# Patient Record
Sex: Female | Born: 1985 | Race: Black or African American | Hispanic: Yes | Marital: Married | Smoking: Never smoker
Health system: Southern US, Community
[De-identification: ages and names within clinical notes are randomized; demographics above are authoritative.]

## PROBLEM LIST (undated history)

## (undated) ENCOUNTER — Inpatient Hospital Stay: Payer: Self-pay

## (undated) DIAGNOSIS — L259 Unspecified contact dermatitis, unspecified cause: Secondary | ICD-10-CM

## (undated) DIAGNOSIS — R87619 Unspecified abnormal cytological findings in specimens from cervix uteri: Secondary | ICD-10-CM

## (undated) HISTORY — DX: Unspecified contact dermatitis, unspecified cause: L25.9

## (undated) HISTORY — DX: Unspecified abnormal cytological findings in specimens from cervix uteri: R87.619

## (undated) HISTORY — PX: OTHER SURGICAL HISTORY: SHX169

---

## 2006-02-01 ENCOUNTER — Ambulatory Visit: Payer: Self-pay | Admitting: Family Medicine

## 2006-02-24 ENCOUNTER — Ambulatory Visit: Payer: Self-pay | Admitting: Family Medicine

## 2006-03-17 DIAGNOSIS — J45909 Unspecified asthma, uncomplicated: Secondary | ICD-10-CM | POA: Insufficient documentation

## 2006-03-17 DIAGNOSIS — J309 Allergic rhinitis, unspecified: Secondary | ICD-10-CM | POA: Insufficient documentation

## 2006-06-22 ENCOUNTER — Ambulatory Visit: Payer: Self-pay | Admitting: Family Medicine

## 2006-07-28 ENCOUNTER — Ambulatory Visit: Payer: Self-pay | Admitting: Family Medicine

## 2006-07-28 DIAGNOSIS — A630 Anogenital (venereal) warts: Secondary | ICD-10-CM | POA: Insufficient documentation

## 2006-07-29 ENCOUNTER — Encounter: Payer: Self-pay | Admitting: Family Medicine

## 2006-07-29 LAB — CONVERTED CEMR LAB: Chlamydia, Swab/Urine, PCR: NEGATIVE

## 2006-07-31 ENCOUNTER — Telehealth: Payer: Self-pay | Admitting: Family Medicine

## 2006-09-27 ENCOUNTER — Encounter: Payer: Self-pay | Admitting: Family Medicine

## 2006-09-27 ENCOUNTER — Other Ambulatory Visit: Admission: RE | Admit: 2006-09-27 | Discharge: 2006-09-27 | Payer: Self-pay | Admitting: Family Medicine

## 2006-09-27 ENCOUNTER — Ambulatory Visit: Payer: Self-pay | Admitting: Family Medicine

## 2006-11-17 ENCOUNTER — Encounter: Payer: Self-pay | Admitting: Family Medicine

## 2007-01-11 ENCOUNTER — Ambulatory Visit: Payer: Self-pay | Admitting: Family Medicine

## 2007-01-11 LAB — CONVERTED CEMR LAB
Ketones, urine, test strip: NEGATIVE
Nitrite: NEGATIVE
Urobilinogen, UA: 0.2

## 2007-01-12 ENCOUNTER — Encounter: Payer: Self-pay | Admitting: Family Medicine

## 2007-01-12 LAB — CONVERTED CEMR LAB
Basophils Relative: 0 % (ref 0–1)
Eosinophils Absolute: 0.2 10*3/uL (ref 0.0–0.7)
Eosinophils Relative: 1 % (ref 0–5)
HCT: 41.8 % (ref 36.0–46.0)
Hemoglobin: 13.2 g/dL (ref 12.0–15.0)
MCHC: 31.6 g/dL (ref 30.0–36.0)
MCV: 86.7 fL (ref 78.0–100.0)
Monocytes Absolute: 0.7 10*3/uL (ref 0.2–0.7)
Monocytes Relative: 4 % (ref 3–11)
RBC: 4.82 M/uL (ref 3.87–5.11)

## 2007-03-28 ENCOUNTER — Ambulatory Visit: Payer: Self-pay | Admitting: Family Medicine

## 2007-03-28 LAB — CONVERTED CEMR LAB
Bilirubin Urine: NEGATIVE
Ketones, urine, test strip: NEGATIVE
pH: 7

## 2007-06-26 ENCOUNTER — Encounter: Payer: Self-pay | Admitting: Family Medicine

## 2007-07-16 ENCOUNTER — Ambulatory Visit: Payer: Self-pay | Admitting: Family Medicine

## 2007-07-16 DIAGNOSIS — N760 Acute vaginitis: Secondary | ICD-10-CM | POA: Insufficient documentation

## 2007-07-16 LAB — CONVERTED CEMR LAB
Bilirubin Urine: NEGATIVE
Glucose, Urine, Semiquant: NEGATIVE
Nitrite: NEGATIVE
Rapid Strep: NEGATIVE

## 2007-07-17 ENCOUNTER — Encounter: Payer: Self-pay | Admitting: Family Medicine

## 2007-07-17 LAB — CONVERTED CEMR LAB

## 2007-08-17 ENCOUNTER — Ambulatory Visit: Payer: Self-pay | Admitting: Family Medicine

## 2007-08-17 LAB — CONVERTED CEMR LAB
Beta hcg, urine, semiquantitative: NEGATIVE
Protein, U semiquant: 30
Urobilinogen, UA: 1

## 2007-08-19 LAB — CONVERTED CEMR LAB
CO2: 23 meq/L (ref 19–32)
Calcium: 9.5 mg/dL (ref 8.4–10.5)
Chloride: 106 meq/L (ref 96–112)
Creatinine, Ser: 0.82 mg/dL (ref 0.40–1.20)
Eosinophils Relative: 2 % (ref 0–5)
Glucose, Bld: 77 mg/dL (ref 70–99)
HCT: 42.5 % (ref 36.0–46.0)
Hemoglobin: 13.5 g/dL (ref 12.0–15.0)
Lymphocytes Relative: 23 % (ref 12–46)
Lymphs Abs: 3.2 10*3/uL (ref 0.7–4.0)
Monocytes Absolute: 0.7 10*3/uL (ref 0.1–1.0)
Preg, Serum: NEGATIVE
RBC: 4.9 M/uL (ref 3.87–5.11)
Total Bilirubin: 0.8 mg/dL (ref 0.3–1.2)
Total Protein: 8.1 g/dL (ref 6.0–8.3)
WBC: 14 10*3/uL — ABNORMAL HIGH (ref 4.0–10.5)

## 2007-09-11 ENCOUNTER — Ambulatory Visit: Payer: Self-pay | Admitting: Family Medicine

## 2007-09-21 ENCOUNTER — Ambulatory Visit: Payer: Self-pay | Admitting: Family Medicine

## 2008-01-08 ENCOUNTER — Ambulatory Visit: Payer: Self-pay | Admitting: Family Medicine

## 2008-02-27 ENCOUNTER — Emergency Department (HOSPITAL_COMMUNITY): Admission: EM | Admit: 2008-02-27 | Discharge: 2008-02-27 | Payer: Self-pay | Admitting: Emergency Medicine

## 2008-05-13 ENCOUNTER — Ambulatory Visit: Payer: Self-pay | Admitting: Family Medicine

## 2008-05-14 ENCOUNTER — Encounter: Payer: Self-pay | Admitting: Family Medicine

## 2008-05-14 LAB — CONVERTED CEMR LAB: GC Probe Amp, Genital: NEGATIVE

## 2008-06-02 ENCOUNTER — Telehealth: Payer: Self-pay | Admitting: Family Medicine

## 2008-06-09 ENCOUNTER — Telehealth: Payer: Self-pay | Admitting: Family Medicine

## 2008-08-26 ENCOUNTER — Ambulatory Visit: Payer: Self-pay | Admitting: Family Medicine

## 2008-08-26 DIAGNOSIS — G43909 Migraine, unspecified, not intractable, without status migrainosus: Secondary | ICD-10-CM | POA: Insufficient documentation

## 2008-08-26 LAB — CONVERTED CEMR LAB
Bilirubin Urine: NEGATIVE
Ketones, urine, test strip: NEGATIVE
Nitrite: NEGATIVE
Specific Gravity, Urine: <1.005

## 2008-09-01 ENCOUNTER — Ambulatory Visit: Payer: Self-pay | Admitting: Family Medicine

## 2008-09-01 ENCOUNTER — Telehealth: Payer: Self-pay | Admitting: Family Medicine

## 2008-09-01 LAB — CONVERTED CEMR LAB: Rapid Strep: NEGATIVE

## 2008-09-25 ENCOUNTER — Encounter: Payer: Self-pay | Admitting: Family Medicine

## 2008-12-22 ENCOUNTER — Ambulatory Visit: Payer: Self-pay | Admitting: Family Medicine

## 2008-12-22 ENCOUNTER — Encounter: Payer: Self-pay | Admitting: Family Medicine

## 2008-12-22 ENCOUNTER — Other Ambulatory Visit: Admission: RE | Admit: 2008-12-22 | Discharge: 2008-12-22 | Payer: Self-pay | Admitting: Family Medicine

## 2008-12-22 DIAGNOSIS — F341 Dysthymic disorder: Secondary | ICD-10-CM

## 2008-12-25 LAB — CONVERTED CEMR LAB: Pap Smear: NORMAL

## 2008-12-26 ENCOUNTER — Ambulatory Visit: Payer: Self-pay | Admitting: Family Medicine

## 2009-02-19 ENCOUNTER — Ambulatory Visit: Payer: Self-pay | Admitting: Family Medicine

## 2009-02-19 DIAGNOSIS — B351 Tinea unguium: Secondary | ICD-10-CM

## 2009-03-05 ENCOUNTER — Telehealth: Payer: Self-pay | Admitting: Family Medicine

## 2009-05-25 ENCOUNTER — Ambulatory Visit: Payer: Self-pay | Admitting: Family Medicine

## 2009-05-27 ENCOUNTER — Telehealth: Payer: Self-pay | Admitting: Family Medicine

## 2009-05-27 ENCOUNTER — Encounter: Payer: Self-pay | Admitting: Family Medicine

## 2009-05-27 LAB — CONVERTED CEMR LAB
Basophils Absolute: 0 10*3/uL (ref 0.0–0.1)
Basophils Relative: 0 % (ref 0–1)
Eosinophils Absolute: 0.1 10*3/uL (ref 0.0–0.7)
Eosinophils Relative: 1 % (ref 0–5)
HCT: 40.9 % (ref 36.0–46.0)
Lymphocytes Relative: 30 % (ref 12–46)
MCHC: 32.5 g/dL (ref 30.0–36.0)
Monocytes Absolute: 0.8 10*3/uL (ref 0.1–1.0)
Platelets: 273 10*3/uL (ref 150–400)
RDW: 13.3 % (ref 11.5–15.5)

## 2009-05-28 ENCOUNTER — Ambulatory Visit: Payer: Self-pay | Admitting: Family Medicine

## 2009-05-30 ENCOUNTER — Encounter: Payer: Self-pay | Admitting: Family Medicine

## 2009-07-10 ENCOUNTER — Ambulatory Visit: Payer: Self-pay | Admitting: Family Medicine

## 2009-07-11 ENCOUNTER — Encounter: Payer: Self-pay | Admitting: Family Medicine

## 2009-07-13 LAB — CONVERTED CEMR LAB: Trich, Wet Prep: NONE SEEN

## 2009-07-14 ENCOUNTER — Encounter: Payer: Self-pay | Admitting: Family Medicine

## 2009-07-15 LAB — CONVERTED CEMR LAB
Chlamydia, Swab/Urine, PCR: NEGATIVE
GC Probe Amp, Urine: NEGATIVE

## 2009-08-16 ENCOUNTER — Emergency Department (HOSPITAL_COMMUNITY): Admission: EM | Admit: 2009-08-16 | Discharge: 2009-08-16 | Payer: Self-pay | Admitting: Emergency Medicine

## 2009-08-27 ENCOUNTER — Telehealth: Payer: Self-pay | Admitting: Family Medicine

## 2009-11-30 ENCOUNTER — Ambulatory Visit: Payer: Self-pay | Admitting: Family Medicine

## 2009-12-01 LAB — CONVERTED CEMR LAB
ALT: 14 units/L (ref 0–35)
AST: 20 units/L (ref 0–37)
CO2: 27 meq/L (ref 19–32)
Calcium: 9.3 mg/dL (ref 8.4–10.5)
Chlamydia, Swab/Urine, PCR: NEGATIVE
Chloride: 105 meq/L (ref 96–112)
Cholesterol: 234 mg/dL — ABNORMAL HIGH (ref 0–200)
GC Probe Amp, Urine: NEGATIVE
Potassium: 4 meq/L (ref 3.5–5.3)
Sodium: 141 meq/L (ref 135–145)
TSH: 0.784 microintl units/mL (ref 0.350–4.500)
Total CHOL/HDL Ratio: 4.4
Total Protein: 7.5 g/dL (ref 6.0–8.3)

## 2009-12-08 ENCOUNTER — Telehealth: Payer: Self-pay | Admitting: Family Medicine

## 2009-12-29 ENCOUNTER — Telehealth: Payer: Self-pay | Admitting: Family Medicine

## 2010-03-23 ENCOUNTER — Ambulatory Visit: Payer: Self-pay | Admitting: Family Medicine

## 2010-03-23 DIAGNOSIS — E669 Obesity, unspecified: Secondary | ICD-10-CM

## 2010-03-23 DIAGNOSIS — E785 Hyperlipidemia, unspecified: Secondary | ICD-10-CM | POA: Insufficient documentation

## 2010-03-30 ENCOUNTER — Ambulatory Visit: Payer: Self-pay | Admitting: Family Medicine

## 2010-04-30 ENCOUNTER — Ambulatory Visit: Admit: 2010-04-30 | Payer: Self-pay | Admitting: Family Medicine

## 2010-04-30 ENCOUNTER — Encounter: Payer: Self-pay | Admitting: Family Medicine

## 2010-04-30 ENCOUNTER — Ambulatory Visit
Admission: RE | Admit: 2010-04-30 | Discharge: 2010-04-30 | Payer: Self-pay | Source: Home / Self Care | Attending: Family Medicine | Admitting: Family Medicine

## 2010-05-04 ENCOUNTER — Encounter: Payer: Self-pay | Admitting: Family Medicine

## 2010-05-04 ENCOUNTER — Ambulatory Visit
Admission: RE | Admit: 2010-05-04 | Discharge: 2010-05-04 | Payer: Self-pay | Source: Home / Self Care | Attending: Family Medicine | Admitting: Family Medicine

## 2010-05-05 ENCOUNTER — Encounter: Payer: Self-pay | Admitting: Family Medicine

## 2010-05-05 LAB — CONVERTED CEMR LAB
Trich, Wet Prep: NONE SEEN
Yeast Wet Prep HPF POC: NONE SEEN

## 2010-05-25 NOTE — Letter (Signed)
Summary: Out of Work  [facility]  [address]   [place], [place] [address]   Phone: [phone]  Fax: [phone]    May 28, 2009   Employee:  SHALEKA BRINES    To Whom It May Concern:   For Medical reasons, please excuse the above named employee from work for the following dates:  Start:   Seen on 05-28-2009  End:   Can return to work on 06-01-2009  If you need additional information, please feel free to contact our office.         Sincerely,    Nani Gasser MD

## 2010-05-25 NOTE — Progress Notes (Signed)
Summary: BCP  Phone Note Call from Patient   Summary of Call: Would like a rx for the birth control samples you gave her- natazia? sent to [facility] in [place] Initial call taken by: Kathlene November,  Aug 27, 2009 3:14 PM  Follow-up for Phone Call        Rx sent.  Follow-up by: Nani Gasser MD,  Aug 27, 2009 4:10 PM    New/Updated Medications: NATAZIA 3/2-2/2-3/1 MG TABS (ESTRADIOL VALERATE-DIENOGEST) Take 1 tablet by mouth once a day Prescriptions: NATAZIA 3/2-2/2-3/1 MG TABS (ESTRADIOL VALERATE-DIENOGEST) Take 1 tablet by mouth once a day  #1 pack x 6   Entered and Authorized by:   Nani Gasser MD   Signed by:   Nani Gasser MD on 08/27/2009   Method used:   Electronically to        [facility] [phone]* (retail)       [address] [place], [place]  [address]       Ph: [phone]       Fax: [phone]   RxID:   [id number]

## 2010-05-25 NOTE — Letter (Signed)
Summary: Work Excuse  [facility]  [address] [place] [address], [address]   [place], [place] [address]   Phone: [phone]  Fax: [phone]    Today's Date: November 30, 2009  Name of Patient: Alexa Hunter  The above named patient had a physical today and a TB skin test place. She is coming back on Wednesday to have it read.  Sincerely yours,   Nani Gasser MD

## 2010-05-25 NOTE — Assessment & Plan Note (Signed)
Summary: CPE   Vital Signs:  Patient profile:   25 year old female Height:      64 inches Weight:      240 pounds Pulse rate:   66 / minute BP sitting:   114 / 68  (left arm) Cuff size:   large  Vitals Entered By: Kathlene November (November 30, 2009 9:26 AM) CC: physical for work- needs tb test placed. Discuss different BCP   Primary Care Provider:  Nani Gasser MD  CC:  physical for work- needs tb test placed. Discuss different BCP.  History of Present Illness: Took teh OCPs for 2 months.  Bled the whole time.  Would like to change OCPS  Noticed 6 spots on the groin are hard adn ready red  and tender. They are slowly resolving.     Current Medications (verified): 1)  Albuterol Sulfate (2.5 Mg/58ml) 0.083% Nebu (Albuterol Sulfate) .... 3ml Neb Up To Three Times A Day As Needed  Allergies (verified): No Known Drug Allergies  Comments:  Nurse/Medical Assistant: The patient's medications and allergies were reviewed with the patient and were updated in the Medication and Allergy Lists. Kathlene November (November 30, 2009 9:27 AM)  Past History:  Past Surgical History: Last updated: 08/17/2007 None  Social History: Living alone.   Working at a [employer].  Never Smoked Alcohol use-no Drug use-no Regular exercise-no  Physical Exam  General:  Well-developed,well-nourished,in no acute distress; alert,appropriate and cooperative throughout examination Head:  Normocephalic and atraumatic without obvious abnormalities. No apparent alopecia or balding. Eyes:  No corneal or conjunctival inflammation noted. EOMI. Perrla.  Ears:  External ear exam shows no significant lesions or deformities.  Otoscopic examination reveals clear canals, tympanic membranes are intact bilaterally without bulging, retraction, inflammation or discharge. Hearing is grossly normal bilaterally. Nose:  External nasal examination shows no deformity or inflammation. Nasal mucosa are pink and moist without lesions  or exudates. Mouth:  Oral mucosa and oropharynx without lesions or exudates.  Teeth in good repair. Neck:  No deformities, masses, or tenderness noted. Chest Wall:  No deformities, masses, or tenderness noted. Lungs:  Normal respiratory effort, chest expands symmetrically. Lungs are clear to auscultation, no crackles or wheezes. Heart:  Normal rate and regular rhythm. S1 and S2 normal without gallop, murmur, click, rub or other extra sounds. Abdomen:  Bowel sounds positive,abdomen soft and non-tender without masses, organomegaly or hernias noted. Msk:  No deformity or scoliosis noted of thoracic or lumbar spine.   Pulses:  R and L carotid,radial,dorsalis pedis and posterior tibial pulses are full and equal bilaterally Extremities:  No clubbing, cyanosis, edema, or deformity noted with normal full range of motion of all joints.   Neurologic:  No cranial nerve deficits noted. Station and gait are normal.  DTRs are symmetrical throughout. Sensory, motor and coordinative functions appear intact. Skin:  Erythematous nodules on her left inner thigh and 2 on her left lower abdomen.  Cervical Nodes:  No lymphadenopathy noted Psych:  Cognition and judgment appear intact. Alert and cooperative with normal attention span and concentration. No apparent delusions, illusions, hallucinations   Impression & Recommendations:  Problem # 1:  HEALTH MAINTENANCE EXAM (ICD-V70.0) She is going to check to see if her tetanus is up to date Due for screening labs.  Exam is normal. Can drop off form for work if needed. TB skin test placed She plan on moving in November back to [place].   Orders: T-Lipid Profile [id number]) T-Comprehensive Metabolic Panel [id number]) T-TSH (  [id number]) T-Chlamydia & GC Probe, Urine ([id number])  Problem # 2:  ASTHMA (ICD-493.90) She is having more frequent sxs. Has had 3 exacerbations in the last couple of months.  Will start a controller.  Start flovent. Samples  given.  If doing well in about 2 months can stop it .   Her updated medication list for this problem includes:    Albuterol Sulfate (2.5 Mg/52ml) 0.083% Nebu (Albuterol sulfate) .Marland KitchenMarland KitchenMarland KitchenMarland Kitchen 3ml neb up to three times a day as needed    Flovent Hfa 110 Mcg/act Aero (Fluticasone propionate  hfa) .[name]... 1 puff inhaled two times a day  Complete Medication List: 1)  Albuterol Sulfate (2.5 Mg/14ml) 0.083% Nebu (Albuterol sulfate) .... 3ml neb up to three times a day as needed 2)  Ortho-novum 7/7/7 (28) 0.5/0.75/1-35 Mg-mcg Tabs (Norethin-eth estrad triphasic) .... Take 1 tablet by mouth once a day 3)  Keflex 500 Mg Caps (Cephalexin) .... Take 1 tablet by mouth four times a day for 7 days 4)  Flovent Hfa 110 Mcg/act Aero (Fluticasone propionate  hfa) .[name].. 1 puff inhaled two times a day  Other Orders: TB Skin Test [id number]) Admin 1st Vaccine ([id number])  Patient Instructions: 1)  You can schedule you pap and breast exam anytime.   2)  Call if birth control causing side effects. New pill was sent. 3)  Start flovent one dose inhaled two times a day 4)  Labs are fasting. We will call you wit the results.  Prescriptions: FLOVENT HFA 110 MCG/ACT AERO (FLUTICASONE PROPIONATE  HFA) 1 puff inhaled two times a day  #1 x 3   Entered and Authorized by:   Nani Gasser MD   Signed by:   Nani Gasser MD on 11/30/2009   Method used:   Electronically to        [facility]* (retail)       [address] [place], [place]  [address]       Ph: [phone]       Fax: [phone]   RxID:   [id number] KEFLEX 500 MG CAPS (CEPHALEXIN) Take 1 tablet by mouth four times a day for 7 days  #28 x 0   Entered and Authorized by:   Nani Gasser MD   Signed by:   Nani Gasser MD on 11/30/2009   Method used:   Electronically to        [facility]* (retail)       [address] [place], [place]  [address]       Ph: [phone]       Fax: [phone]   RxID:   [id number] ORTHO-NOVUM  7/7/7 (28) 0.5/0.75/1-35 MG-MCG TABS (NORETHIN-ETH ESTRAD TRIPHASIC) Take 1 tablet by mouth once a day  #1 pack x 3   Entered and Authorized by:   Nani Gasser MD   Signed by:   Nani Gasser MD on 11/30/2009   Method used:   Electronically to        [facility]* (retail)       [address] [place], [place]  [address]       Ph: [phone]       Fax: [phone]   RxID:   [id number]    Immunizations Administered:  PPD Skin Test:    Vaccine Type: PPD    Site: left forearm    Mfr: [employer]  Dose: 0.1 ml    Route: ID    Given by: Kathlene November    Exp. Date: 02/21/2011    Lot #: [id number]   Appended Document: CPE    Clinical Lists Changes  Observations: Added new observation of TB PPDRESULT: negative (12/02/2009 13:45) Added new observation of PPD RESULT: < 5mm (12/02/2009 13:45) Added new observation of TB-PPD RDDTE: 12/02/2009 (12/02/2009 13:45)       PPD Results    Date of reading: 12/02/2009    Results: < 5mm    Interpretation: negative

## 2010-05-25 NOTE — Assessment & Plan Note (Signed)
Summary: STD??? & BIRTH CONTROL   Vital Signs:  Patient profile:   25 year old female Height:      64 inches Weight:      240 pounds Pulse rate:   71 / minute BP sitting:   120 / 69  (left arm) Cuff size:   large  Vitals Entered By: Kathlene November (July 10, 2009 1:04 PM) CC: wants on BCP and STD check   Primary Care Provider:  Nani Gasser MD  CC:  wants on BCP and STD check.  History of Present Illness: wants on BCP and STD check.  Doesn want to do DEPO again because gained weight. Not interested in the Nuvaring. would like to try a pill.  Would like to have STD testing.  Denies any change in discahrge. Does have some irritation from shaving.   Current Medications (verified): 1)  Albuterol Sulfate (2.5 Mg/47ml) 0.083% Nebu (Albuterol Sulfate) .... 3ml Neb Up To Three Times A Day As Needed  Allergies (verified): No Known Drug Allergies  Comments:  Nurse/Medical Assistant: The patient's medications and allergies were reviewed with the patient and were updated in the Medication and Allergy Lists. Kathlene November (July 10, 2009 1:04 PM)   Impression & Recommendations:  Problem # 1:  CONTRACEPTIVE COUNSELING NEC (ICD-V25.09) Would like try a pill. Samples of natazia given.   Problem # 2:  PELVIC  PAIN (ICD-789.09) Will get STD testing.   Orders: T-HIV Antibody  (Reflex) [id number]) T-Syphilis Test ([facility]) [id number]) T-Chlamydia & GC Probe, Urine ([id number]) T-Wet Prep for Christoper Allegra, Clue Cells ([id number])  Complete Medication List: 1)  Albuterol Sulfate (2.5 Mg/93ml) 0.083% Nebu (Albuterol sulfate) .... 3ml neb up to three times a day as needed

## 2010-05-25 NOTE — Assessment & Plan Note (Signed)
Summary: Missed period   Vital Signs:  Patient profile:   25 year old female Height:      64 inches Weight:      248 pounds Pulse rate:   99 / minute BP sitting:   102 / 72  (right arm) Cuff size:   large  Vitals Entered By: Avon Gully CMA, Duncan Dull) (March 30, 2010 11:19 AM) CC: Didnt have a period last month,missed 7 pills last month, last time intercourse was the 13th   Primary Care Provider:  Nani Gasser MD  CC:  Didnt have a period last month, missed 7 pills last month, and last time intercourse was the 13th.  History of Present Illness: Didnt have a period last month,missed 7 pills last month, last time intercourse was the 13th.  Says she doesn't use condoms. Been with current boyfriend for 4 years.  Says hasn't started new pill pack because was waiting for her period.   Current Medications (verified): 1)  Albuterol Sulfate (2.5 Mg/54ml) 0.083% Nebu (Albuterol Sulfate) .... 3ml Neb Up To Three Times A Day As Needed 2)  Kariva 0.15-0.02/0.01 Mg (21/5) Tabs (Desogestrel-Ethinyl Estradiol) .... Take 1 Tablet By Mouth Once A Day 3)  Flovent Hfa 110 Mcg/act Aero (Fluticasone Propionate  Hfa) .[name].. 1 Puff Inhaled Two Times A Day  Allergies (verified): No Known Drug Allergies  Comments:  Nurse/Medical Assistant: The patient's medications and allergies were reviewed with the patient and were updated in the Medication and Allergy Lists. Avon Gully CMA, Duncan Dull) (March 30, 2010 11:21 AM)  Physical Exam  General:  Well-developed,well-nourished,in no acute distress; alert,appropriate and cooperative throughout examination   Impression & Recommendations:  Problem # 1:  AMENORRHEA (ICD-626.0)  UPT is neg. Recommend go ahead and start new OCP.  Discussed other optons since having difficulty remembering to take her pill.  She wanted to stay with the pills for cost reasons.  Her updated medication list for this problem includes:    Kariva 0.15-0.02/0.01 Mg  (21/5) Tabs (Desogestrel-ethinyl estradiol) .[name]... Take 1 tablet by mouth once a day  Orders: Urine Pregnancy Test  ([address])  Complete Medication List: 1)  Albuterol Sulfate (2.5 Mg/80ml) 0.083% Nebu (Albuterol sulfate) .... 3ml neb up to three times a day as needed 2)  Kariva 0.15-0.02/0.01 Mg (21/5) Tabs (Desogestrel-ethinyl estradiol) .... Take 1 tablet by mouth once a day 3)  Flovent Hfa 110 Mcg/act Aero (Fluticasone propionate  hfa) .[name].. 1 puff inhaled two times a day   Orders Added: 1)  Urine Pregnancy Test  [81025] 2)  Est. Patient Level III [[address]]     Laboratory Results   Urine Tests  Date/Time Received: 03/30/10 Date/Time Reported: 03/30/10    Urine HCG: negative

## 2010-05-25 NOTE — Letter (Signed)
Summary: Staff Medical Report Form  Staff Medical Report Form   Imported By: Lanelle Bal 12/15/2009 11:15:33  _____________________________________________________________________  External Attachment:    Type:   Image     Comment:   External Document

## 2010-05-25 NOTE — Progress Notes (Signed)
Summary: meds  Phone Note Call from Patient   Caller: Patient Call For: Nani Gasser MD Summary of Call: Pt wants a generic BCP called in because she cant afford the last one that was rx'd Initial call taken by: Avon Gully CMA, Duncan Dull),  December 29, 2009 12:57 PM  Follow-up for Phone Call        Rx sent.  Follow-up by: Nani Gasser MD,  December 29, 2009 12:58 PM  Additional Follow-up for Phone Call Additional follow up Details #1::        Pt notified new rx sent to pharmacy Additional Follow-up by: Kathlene November,  December 29, 2009 1:24 PM    New/Updated Medications: KARIVA 0.15-0.02/0.01 MG (21/5) TABS (DESOGESTREL-ETHINYL ESTRADIOL) Take 1 tablet by mouth once a day Prescriptions: KARIVA 0.15-0.02/0.01 MG (21/5) TABS (DESOGESTREL-ETHINYL ESTRADIOL) Take 1 tablet by mouth once a day  #1 pack x 6   Entered and Authorized by:   Nani Gasser MD   Signed by:   Nani Gasser MD on 12/29/2009   Method used:   Electronically to        [facility]* (retail)       [address] [place], [place]  [address]       Ph: [phone]       Fax: [phone]   RxID:   [id number]

## 2010-05-25 NOTE — Assessment & Plan Note (Signed)
Summary: follow cholesterol and weight   Vital Signs:  Patient profile:   25 year old female Height:      64 inches Weight:      248 pounds BMI:     42.72 Pulse rate:   78 / minute BP sitting:   113 / 72  (right arm) Cuff size:   large  Vitals Entered By: Avon Gully CMA, Duncan Dull) (March 23, 2010 8:33 AM) CC: discuss chol and wt fluctuating   Primary Care Provider:  Nani Gasser MD  CC:  discuss chol and wt fluctuating.  History of Present Illness: discuss chol and wt fluctuating.  WEight keeps fluctuating up and down.  Has realy cut out foods.  This week gave up sodas adn juice.  Has been unalbe to work out but now has only one job and so plans to go back to [employer]  Went to hospital in October for an asthma attack.  WAs eating normallly when she was working 2 jobs.  Has tried hydroxycut and quick trim. Now living with her mom. is in a somewhat unhealthy living situation for her as she often eats the food to her mother cooks which she admits are not healthy food choices.  She very rarely cooks vegetables  Current Medications (verified): 1)  Albuterol Sulfate (2.5 Mg/22ml) 0.083% Nebu (Albuterol Sulfate) .... 3ml Neb Up To Three Times A Day As Needed 2)  Kariva 0.15-0.02/0.01 Mg (21/5) Tabs (Desogestrel-Ethinyl Estradiol) .... Take 1 Tablet By Mouth Once A Day 3)  Flovent Hfa 110 Mcg/act Aero (Fluticasone Propionate  Hfa) .[name].. 1 Puff Inhaled Two Times A Day  Allergies (verified): No Known Drug Allergies  Comments:  Nurse/Medical Assistant: The patient's medications and allergies were reviewed with the patient and were updated in the Medication and Allergy Lists. Avon Gully CMA, Duncan Dull) (March 23, 2010 8:34 AM)  Social History: Living alone.   Workingin food services Never Smoked Alcohol use-no Drug use-no Regular exercise-no  Physical Exam  General:  Well-developed,well-nourished,in no acute distress; alert,appropriate and cooperative throughout  examination Head:  Normocephalic and atraumatic without obvious abnormalities. No apparent alopecia or balding. Lungs:  Normal respiratory effort, chest expands symmetrically. Lungs are clear to auscultation, no crackles or wheezes. Heart:  Normal rate and regular rhythm. S1 and S2 normal without gallop, murmur, click, rub or other extra sounds.   Impression & Recommendations:  Problem # 1:  HYPERLIPIDEMIA (ICD-272.4) we discussed and reviewed her results.  She is actually gained weight since her last office visit.  We discussed making some healthy dietary changes and starting to work out again.  She does lose weight when she does this.  Then I recommend she go for fasting lab work in about one month.  It is still elevated we may need to start a statin at that time. Orders: T-Lipid Profile [id number])  Labs Reviewed: SGOT: 20 (11/30/2009)   SGPT: 14 (11/30/2009)   HDL:53 (11/30/2009)  LDL:160 (11/30/2009)  Chol:234 (11/30/2009)  Trig:104 (11/30/2009)  Problem # 2:  OBESITY (ICD-278.00) we discussed different strategies.  Now that she is back to walking only one shot I think this would make a tremendous difference.  She is planning on joining a gym and leaving two gr per eyes per mother's house which may be helpful as well.  Complete Medication List: 1)  Albuterol Sulfate (2.5 Mg/37ml) 0.083% Nebu (Albuterol sulfate) .... 3ml neb up to three times a day as needed 2)  Kariva 0.15-0.02/0.01 Mg (21/5) Tabs (Desogestrel-ethinyl estradiol) .... Take  1 tablet by mouth once a day 3)  Flovent Hfa 110 Mcg/act Aero (Fluticasone propionate  hfa) .[name].. 1 puff inhaled two times a day  Patient Instructions: 1)  Go to the lab fasting for your cholesterol.    Orders Added: 1)  T-Lipid Profile [[id number]] 2)  Est. Patient Level III [[address]]

## 2010-05-25 NOTE — Assessment & Plan Note (Signed)
Summary: FU Finger Abscess.    Vital Signs:  Patient profile:   25 year old female Height:      64 inches Weight:      237 pounds BMI:     40.83 O2 Sat:      99 % on Room air Temp:     97.9 degrees F oral Pulse rate:   65 / minute BP sitting:   105 / 70  (right arm)  O2 Flow:  Room air CC: F/U on bite, no improvement since last visit   Primary Care Provider:  Nani Gasser MD  CC:  F/U on bite and no improvement since last visit.  History of Present Illness: F/U on bite, no improvement since last visit.  Very painful. Yesterday drained green pus and blood. Still very painful.  Not taking nay pain relievers. Has been taking the doxy but didn't take anything this AM.  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   Current Medications (verified): 1)  Fluoxetine Hcl 20 Mg Tabs (Fluoxetine Hcl) .... Take 1 Tablet By Mouth Once A Day 2)  Ortho Evra 150-20 Mcg/24hr Ptwk (Norelgestromin-Eth Estradiol) .... Apply Once A Week. Rotate Sites. 3)  Albuterol Sulfate (2.5 Mg/32ml) 0.083% Nebu (Albuterol Sulfate) .... 3ml Neb Up To Three Times A Day As Needed 4)  Doxycycline Hyclate 100 Mg Tabs (Doxycycline Hyclate) .... Take 1 Tablet By Mouth Two Times A Day For 10 Days.  Allergies (verified): No Known Drug Allergies  Physical Exam  General:  Well-developed,well-nourished,in no acute distress; alert,appropriate and cooperative throughout examination Msk:  Frist finger wtih  actually decreased surrounding erythema compared to 2 days ago but now the center looks more indurated and is draining yellow pus. Would culture obtained.    Impression & Recommendations:  Problem # 1:  ABSCESS, FINGER (ICD-681.00) Likel MRSA.  Will change to bactrim since not responding as well to doxy as I would like.  Call if not better by Monday. 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  Can use Tylenol or Motrin as needed for pain relief and keep  elevated.  Her updated medication list for this problem includes:    Bactrim Ds 800-160 Mg Tabs (Sulfamethoxazole-trimethoprim) .[name]... 2 tabs by mouth two times a day for 10 days .  Complete Medication List: 1)  Fluoxetine Hcl 20 Mg Tabs (Fluoxetine hcl) .... Take 1 tablet by mouth once a day 2)  Ortho Evra 150-20 Mcg/24hr Ptwk (Norelgestromin-eth estradiol) .... Apply once a week. rotate sites. 3)  Albuterol Sulfate (2.5 Mg/73ml) 0.083% Nebu (Albuterol sulfate) .... 3ml neb up to three times a day as needed 4)  Bactrim Ds 800-160 Mg Tabs (Sulfamethoxazole-trimethoprim) .... 2 tabs by mouth two times a day for 10 days .  Other Orders: T-Culture, Wound ([id number]) Prescriptions: BACTRIM DS 800-160 MG TABS (SULFAMETHOXAZOLE-TRIMETHOPRIM) 2 tabs by mouth two times a day for 10 days .  #40 x 0   Entered and Authorized by:   Nani Gasser MD   Signed by:   Nani Gasser MD on 05/28/2009   Method used:   Electronically to        [facility] [phone]* (retail)       [address]       [place], [place]  [address]       Ph: [phone]       Fax: [phone]   RxID:   [id number]

## 2010-05-25 NOTE — Assessment & Plan Note (Signed)
Summary: BIT BY A SPIDER--SWOLLEN/   Vital Signs:  Patient profile:   25 year old female Height:      64 inches Weight:      239 pounds Temp:     98.2 degrees F oral Pulse rate:   58 / minute BP sitting:   106 / 67  (left arm) Cuff size:   large  Vitals Entered By: Kathlene November (May 25, 2009 11:37 AM) CC: ? insect bite on right index finger yesterday- painful, red, swelling   Primary Care Provider:  Nani Gasser MD  CC:  ? insect bite on right index finger yesterday- painful, red, and swelling.  History of Present Illness: ? insect bite on right index finger yesterday- painful, red, swelling. Didn't see the spider. No hiking or camping.  Did have a vesicle on it yesterday adn it drained some clear fluid. Very painful and tender and hot today. No alevel or IBU for pain relief.  IT was more red this AM.   Current Medications (verified): 1)  Fluoxetine Hcl 20 Mg Tabs (Fluoxetine Hcl) .... Take 1 Tablet By Mouth Once A Day 2)  Ortho Evra 150-20 Mcg/24hr Ptwk (Norelgestromin-Eth Estradiol) .... Apply Once A Week. Rotate Sites. 3)  Albuterol Sulfate (2.5 Mg/96ml) 0.083% Nebu (Albuterol Sulfate) .... 3ml Neb Up To Three Times A Day As Needed  Allergies (verified): No Known Drug Allergies  Comments:  Nurse/Medical Assistant: The patient's medications and allergies were reviewed with the patient and were updated in the Medication and Allergy Lists. Kathlene November (May 25, 2009 11:38 AM)  Social History: Living alone.  Engaged. Back in School. Working at a [employer].  Never Smoked Alcohol use-no Drug use-no Regular exercise-no  Physical Exam  General:  Well-developed,well-nourished,in no acute distress; alert,appropriate and cooperative throughout examination Head:  Normocephalic and atraumatic without obvious abnormalities. No apparent alopecia or balding. Msk:  Right hand, first finger. Erythematous and swollen form teh MCP to the PIP. About half way between has a 1.2  papules with a clear center. NO chane in color except forthe redness.  Very tender. No active drainage. Painful to bend it because of swelling.    Impression & Recommendations:  Problem # 1:  BITE OF NONVENOMOUS ARTHROPOD (ICD-E906.4) Assessment New Since has aprox 3-4 cm of surrounding ertythema and significant swelling will start ABX, doxy. Call if not starting gto look better in 48 hours.  Also consider MRSA thought the drianage she has had is clear and not pus so less likely.  Call if getting worse of turns black in color.   Complete Medication List: 1)  Fluoxetine Hcl 20 Mg Tabs (Fluoxetine hcl) .... Take 1 tablet by mouth once a day 2)  Ortho Evra 150-20 Mcg/24hr Ptwk (Norelgestromin-eth estradiol) .... Apply once a week. rotate sites. 3)  Albuterol Sulfate (2.5 Mg/54ml) 0.083% Nebu (Albuterol sulfate) .... 3ml neb up to three times a day as needed 4)  Doxycycline Hyclate 100 Mg Tabs (Doxycycline hyclate) .... Take 1 tablet by mouth two times a day for 10 days. Prescriptions: DOXYCYCLINE HYCLATE 100 MG TABS (DOXYCYCLINE HYCLATE) Take 1 tablet by mouth two times a day for 10 days.  #20 x 0   Entered and Authorized by:   Nani Gasser MD   Signed by:   Nani Gasser MD on 05/25/2009   Method used:   Electronically to        [facility]* (retail)       [address]  [place], [place]  [address]       Ph: [phone]       Fax: [phone]   RxID:   [id number]

## 2010-05-25 NOTE — Progress Notes (Signed)
Summary: Form Faxed  Phone Note Call from Patient   Caller: Patient Summary of Call: Pt came by and dropped off her Physical/TB form to be filled out by the Dr.... She would like to know if  you can go ahead and fax the form to her employer at Fax# [phone]... Please let patient know the status on this. Thanks, Victorino Dike Initial call taken by: Michaelle Copas,  December 08, 2009 9:15 AM  Follow-up for Phone Call        LMOVM that form was faxed. KJ LPN Follow-up by: Kathlene November,  December 08, 2009 9:21 AM

## 2010-05-25 NOTE — Progress Notes (Signed)
Summary: hand is still not better from [facility] Visit  Phone Note Call from Patient   Caller: Patient Summary of Call: Dr.Pecolia Marando     Call Back  [phone]  Patient was seen on Monday for a bite on her hand and she was told to call back if it was no better. Initial call taken by: Vanessa Swaziland,  May 27, 2009 8:09 AM  Follow-up for Phone Call        As long as not worse, continue ABX and then come in tomorrow for me to look at it again.  Follow-up by: Nani Gasser MD,  May 27, 2009 9:08 AM  Additional Follow-up for Phone Call Additional follow up Details #1::        Pt notified and sent to scheduleing. Additional Follow-up by: Kathlene November,  May 27, 2009 9:12 AM     Appended Document: hand is still not better from [facility] Visit Pt walked in after 4:30 pm today.  Dr Ovidio Kin schedule was completely full and Dr Linford Arnold not present.  She has appt in the AM.  I added a CBC with diff today.  She will stay on antibiotics and f/u in the AM. Seymour Bars, D.O.

## 2010-05-27 NOTE — Assessment & Plan Note (Signed)
Summary: Vaginitis   Vital Signs:  Patient profile:   25 year old female Height:      64 inches Weight:      246 pounds Pulse rate:   58 / minute BP sitting:   113 / 68  (right arm) Cuff size:   large  Vitals Entered By: Avon Gully CMA, Duncan Dull) (May 04, 2010 9:48 AM) CC: discharge,just noticed yesterday   Primary Care Provider:  Nani Gasser MD  CC:  discharge and just noticed yesterday.  History of Present Illness: discharge,just noticed yesterday.  Heavy discharge, but not ithcy or irritation. No odor. Some mild pelvic pressure but says usuallly feels this way when getting ready to start her periods. Says just wants to make sure not STD. She is also on amox for sinusitisi and is finally starting ot feel better. She denis any vaginal lesions or rash.    Current Medications (verified): 1)  Albuterol Sulfate (2.5 Mg/80ml) 0.083% Nebu (Albuterol Sulfate) .... 3ml Neb Up To Three Times A Day As Needed 2)  Kariva 0.15-0.02/0.01 Mg (21/5) Tabs (Desogestrel-Ethinyl Estradiol) .... Take 1 Tablet By Mouth Once A Day 3)  Flovent Hfa 110 Mcg/act Aero (Fluticasone Propionate  Hfa) .[name].. 1 Puff Inhaled Two Times A Day 4)  Amoxicillin 875 Mg Tabs (Amoxicillin) .... Take 1 Tablet By Mouth Two Times A Day For 10 Days  Allergies (verified): No Known Drug Allergies  Comments:  Nurse/Medical Assistant: The patient's medications and allergies were reviewed with the patient and were updated in the Medication and Allergy Lists. Avon Gully CMA, Duncan Dull) (May 04, 2010 9:49 AM)   Impression & Recommendations:  Problem # 1:  UNSPECIFIED VAGINITIS AND VULVOVAGINITIS (ICD-616.10)  Will send wet prep. She could have yeast as she is on ABX.  Her updated medication list for this problem includes:    Amoxicillin 875 Mg Tabs (Amoxicillin) .[name]... Take 1 tablet by mouth two times a day for 10 days  Orders: T-Chlamydia & GC Probe, Urine ([id number]) T-Wet Prep for Christoper Allegra, Clue Cells ([id number])  Complete Medication List: 1)  Albuterol Sulfate (2.5 Mg/68ml) 0.083% Nebu (Albuterol sulfate) .... 3ml neb up to three times a day as needed 2)  Kariva 0.15-0.02/0.01 Mg (21/5) Tabs (Desogestrel-ethinyl estradiol) .... Take 1 tablet by mouth once a day 3)  Flovent Hfa 110 Mcg/act Aero (Fluticasone propionate  hfa) .[name].. 1 puff inhaled two times a day 4)  Amoxicillin 875 Mg Tabs (Amoxicillin) .... Take 1 tablet by mouth two times a day for 10 days  Anticoagulation Management Assessment/Plan:            Patient Instructions: 1)  We will call you with the lab results.    Orders Added: 1)  T-Chlamydia & GC Probe, Urine [[id number]] 2)  T-Wet Prep for Christoper Allegra, Clue Cells [[id number]] 3)  Est. Patient Level II [30865]  Appended Document: Vaginitis    Clinical Lists Changes  Orders: Added new Service order of UA Dipstick w/o Micro (automated)  (81003) - Signed Added new Test order of T-Urine Culture (Spectrum Order) [id number]) - Signed Observations: Added new observation of WBC DIPSTK U: negative (05/04/2010 10:42) Added new observation of NITRITE URN: negative (05/04/2010 10:42) Added new observation of UROBILINOGEN: 0.2  (05/04/2010 10:42) Added new observation of PROTEIN, URN: negative  (05/04/2010 10:42) Added new observation of PH URINE: 6.5  (05/04/2010 10:42) Added new observation of BLOOD UR DIP: small  (05/04/2010 10:42) Added new observation of SPEC GR URIN: 1.020  (05/04/2010 10:42) Added  new observation of KETONES URN: negative  (05/04/2010 10:42) Added new observation of BILIRUBIN UR: negative  (05/04/2010 10:42) Added new observation of GLUCOSE, URN: negative  (05/04/2010 10:42) Added new observation of APPEARANCE U: Clear  (05/04/2010 10:42) Added new observation of UA COLOR: yellow  (05/04/2010 10:42)      Laboratory Results   Urine Tests  Date/Time Received: 05/04/09 Date/Time Reported: 05/04/09  Routine  Urinalysis   Color: yellow Appearance: Clear Glucose: negative   (Normal Range: Negative) Bilirubin: negative   (Normal Range: Negative) Ketone: negative   (Normal Range: Negative) Spec. Gravity: 1.020   (Normal Range: 1.003-1.035) Blood: small   (Normal Range: Negative) pH: 6.5   (Normal Range: 5.0-8.0) Protein: negative   (Normal Range: Negative) Urobilinogen: 0.2   (Normal Range: 0-1) Nitrite: negative   (Normal Range: Negative) Leukocyte Esterace: negative   (Normal Range: Negative)

## 2010-05-27 NOTE — Assessment & Plan Note (Signed)
Summary: Sinusitis   Vital Signs:  Patient profile:   25 year old female Height:      64 inches Weight:      248 pounds Pulse rate:   81 / minute BP sitting:   106 / 71  (right arm) Cuff size:   large  Vitals Entered By: Avon Gully CMA, Duncan Dull) (April 30, 2010 1:35 PM) CC: headache, sinus pressure   Primary Care Provider:  Nani Gasser MD  CC:  headache and sinus pressure.  History of Present Illness: sinus press adn pain for > 1 week.  Now with HA.  SEver bilat facial pressure.  Mostly clear nasal drainage.  Mild cough. No fever.  + sick contacts.  No meds for it.   Current Medications (verified): 1)  Albuterol Sulfate (2.5 Mg/6ml) 0.083% Nebu (Albuterol Sulfate) .... 3ml Neb Up To Three Times A Day As Needed 2)  Kariva 0.15-0.02/0.01 Mg (21/5) Tabs (Desogestrel-Ethinyl Estradiol) .... Take 1 Tablet By Mouth Once A Day 3)  Flovent Hfa 110 Mcg/act Aero (Fluticasone Propionate  Hfa) .[name].. 1 Puff Inhaled Two Times A Day  Allergies (verified): No Known Drug Allergies  Comments:  Nurse/Medical Assistant: The patient's medications and allergies were reviewed with the patient and were updated in the Medication and Allergy Lists. Avon Gully CMA, Duncan Dull) (April 30, 2010 1:37 PM)  Physical Exam  General:  Well-developed,well-nourished,in no acute distress; alert,appropriate and cooperative throughout examination Head:  Normocephalic and atraumatic without obvious abnormalities. No apparent alopecia or balding.She is very tender over the masillary sinuses.  Eyes:  No corneal or conjunctival inflammation noted. EOMI. Perrla.  Ears:  External ear exam shows no significant lesions or deformities.  Otoscopic examination reveals clear canals, tympanic membranes are intact bilaterally without bulging, retraction, inflammation or discharge. Hearing is grossly normal bilaterally. Nose:  External nasal examination shows no deformity or inflammation. Turbinates are swollen.    Mouth:  Oral mucosa and oropharynx without lesions or exudates.  Teeth in good repair. Neck:  No deformities, masses, or tenderness noted. Lungs:  Normal respiratory effort, chest expands symmetrically. Lungs are clear to auscultation, no crackles or wheezes. Heart:  Normal rate and regular rhythm. S1 and S2 normal without gallop, murmur, click, rub or other extra sounds. Skin:  no rashes.   Cervical Nodes:  No lymphadenopathy noted Psych:  Cognition and judgment appear intact. Alert and cooperative with normal attention span and concentration. No apparent delusions, illusions, hallucinations   Impression & Recommendations:  Problem # 1:  SINUSITIS - ACUTE-NOS (ICD-461.9) explained she is on the early side for treatment of sinusitis but will go ahead nad give rx for antibiotic. Can use a nasal decongestant.  Her updated medication list for this problem includes:    Amoxicillin 875 Mg Tabs (Amoxicillin) .[name]... Take 1 tablet by mouth two times a day for 10 days  Instructed on treatment. Call if symptoms persist or worsen.   Complete Medication List: 1)  Albuterol Sulfate (2.5 Mg/84ml) 0.083% Nebu (Albuterol sulfate) .... 3ml neb up to three times a day as needed 2)  Kariva 0.15-0.02/0.01 Mg (21/5) Tabs (Desogestrel-ethinyl estradiol) .... Take 1 tablet by mouth once a day 3)  Flovent Hfa 110 Mcg/act Aero (Fluticasone propionate  hfa) .[name].. 1 puff inhaled two times a day 4)  Amoxicillin 875 Mg Tabs (Amoxicillin) .... Take 1 tablet by mouth two times a day for 10 days  Patient Instructions: 1)  Call if not  better in one week.  2)  Can  take a decongestant if you want too.  Prescriptions: KARIVA 0.15-0.02/0.01 MG (21/5) TABS (DESOGESTREL-ETHINYL ESTRADIOL) Take 1 tablet by mouth once a day  #1 pack x 6   Entered and Authorized by:   Nani Gasser MD   Signed by:   Nani Gasser MD on 04/30/2010   Method used:   Electronically to        [facility]* (retail)       [address] [place], [place]  [address]       Ph: [phone]       Fax: [phone]   RxID:   [id number] AMOXICILLIN 875 MG TABS (AMOXICILLIN) Take 1 tablet by mouth two times a day for 10 days  #20 x 0   Entered and Authorized by:   Nani Gasser MD   Signed by:   Nani Gasser MD on 04/30/2010   Method used:   Electronically to        [facility]* (retail)       [address] [place], [place]  [address]       Ph: [phone]       Fax: [phone]   RxID:   [id number]    Orders Added: 1)  Est. Patient Level III [88416]

## 2010-05-27 NOTE — Letter (Signed)
Summary: Out of Work  [facility]  [address]   [place], [place] [address]   Phone: [phone]  Fax: [phone]    April 30, 2010   Employee:  Alexa Hunter    To Whom It May Concern:   For Medical reasons, please excuse the above named employee from work for the following dates:  Start:   04-30-2009  End:   1-12/2009  If you need additional information, please feel free to contact our office.         Sincerely,    Nani Gasser MD

## 2010-06-28 ENCOUNTER — Ambulatory Visit: Payer: Self-pay | Admitting: Family Medicine

## 2010-08-11 ENCOUNTER — Encounter: Payer: Self-pay | Admitting: Family Medicine

## 2010-08-12 ENCOUNTER — Telehealth: Payer: Self-pay | Admitting: *Deleted

## 2010-08-12 ENCOUNTER — Ambulatory Visit (INDEPENDENT_AMBULATORY_CARE_PROVIDER_SITE_OTHER): Payer: Commercial Managed Care - PPO | Admitting: Family Medicine

## 2010-08-12 ENCOUNTER — Encounter: Payer: Self-pay | Admitting: Family Medicine

## 2010-08-12 DIAGNOSIS — L0291 Cutaneous abscess, unspecified: Secondary | ICD-10-CM

## 2010-08-12 MED ORDER — SULFAMETHOXAZOLE-TMP DS 800-160 MG PO TABS: 1.0000 | ORAL_TABLET | Freq: Two times a day (BID) | ORAL | Status: AC

## 2010-08-12 MED ORDER — DESOGESTREL-ETHINYL ESTRADIOL 0.15-0.02/0.01 MG (21/5) PO TABS: 1.0000 | ORAL_TABLET | Freq: Every day | ORAL | Status: DC

## 2010-08-12 MED ORDER — PHENTERMINE HCL 37.5 MG PO CAPS: 37.5000 mg | ORAL_CAPSULE | ORAL | Status: DC

## 2010-08-12 NOTE — Telephone Encounter (Signed)
If it drained that is really good  Just keep wound covered.  Start the antibiotic.

## 2010-08-12 NOTE — Telephone Encounter (Signed)
Called and vm sounded like it cut but I left a message

## 2010-08-12 NOTE — Telephone Encounter (Signed)
Pt LMOM stating boil on upper legged drained and now she has a deep hole. Pt would like to know what she should do. Please advise.

## 2010-08-12 NOTE — Progress Notes (Signed)
  Subjective:    Patient ID: Alexa Hunter, female    DOB: 09-12-1985, 25 y.o.   MRN: [id number]  HPI She has 2 portals a particular concern. She has one on the right inner thigh and one on her left buttock cheek. She says they have been there for several days. The herpetic cheek started draining a couple days ago and there was some drainage out of the one on her right inner thigh as well. She said that on her buttock drains a large amount of fluid. Both are extremely tender so she made an appointment today. No fever chills or muscle aches.   Review of Systems     Objective:   Physical Exam  Constitutional: She appears well-developed.  Skin:       On her left buttock cheek she has an abscess that is approximately 4 x 5 cm. Some slight erythema around the border. No active drainage. On her right inner thigh she has an abscess that was smaller at maybe 3 x 3 cm. It has significant surrounding erythema extending out approximately 8 cm along one side. Both of these are extremely tender to touch. There is no active drainage out of the right inner thigh lesion. She also has a smaller erythematous papules on both buttock cheeks.          Assessment & Plan:  Abscess x2-will start her on Bactrim to cover for MRSA since she also has evidence of cellulitis. The lesion on her buttock was lanced and probed but no drainage was expressed. There is a wound culture was not obtained. She's to followup on Monday if she's not significantly improved. We also discussed that the erythema continues to spread her pain gets worse if she should have urgent care this weekend. Certainly she can use ibuprofen or Tylenol until then.  I&D of abscess of the left buttock. There was cleaned with iodine and alcohol swab. 1% lidocaine with epinephrine was used to anesthetize the area an incision was made with a #10 blade. The wound was probed and no active drainage. The wound was approximately 2 and half centimeters deep.

## 2010-08-12 NOTE — Telephone Encounter (Signed)
See note below

## 2010-08-12 NOTE — Patient Instructions (Signed)
Follow up at urgent care if the redness gets worse. Otherwise f/u on Monday.

## 2010-08-26 ENCOUNTER — Ambulatory Visit: Payer: Commercial Managed Care - PPO | Admitting: Family Medicine

## 2010-08-27 ENCOUNTER — Ambulatory Visit: Payer: Commercial Managed Care - PPO | Admitting: Family Medicine

## 2010-09-16 ENCOUNTER — Ambulatory Visit (INDEPENDENT_AMBULATORY_CARE_PROVIDER_SITE_OTHER): Payer: Commercial Managed Care - PPO | Admitting: Family Medicine

## 2010-09-16 ENCOUNTER — Other Ambulatory Visit (HOSPITAL_COMMUNITY)
Admission: RE | Admit: 2010-09-16 | Discharge: 2010-09-16 | Disposition: A | Discharge status: Home / Self Care | Payer: Commercial Managed Care - PPO | Source: Ambulatory Visit | Attending: Family Medicine | Admitting: Family Medicine

## 2010-09-16 ENCOUNTER — Encounter: Payer: Self-pay | Admitting: Family Medicine

## 2010-09-16 VITALS — BP 123/78 | HR 62 | Ht 64.0 in | Wt 244.0 lb

## 2010-09-16 DIAGNOSIS — E669 Obesity, unspecified: Secondary | ICD-10-CM

## 2010-09-16 DIAGNOSIS — N760 Acute vaginitis: Secondary | ICD-10-CM

## 2010-09-16 DIAGNOSIS — Z124 Encounter for screening for malignant neoplasm of cervix: Secondary | ICD-10-CM

## 2010-09-16 DIAGNOSIS — Z01419 Encounter for gynecological examination (general) (routine) without abnormal findings: Secondary | ICD-10-CM

## 2010-09-16 DIAGNOSIS — Z113 Encounter for screening for infections with a predominantly sexual mode of transmission: Secondary | ICD-10-CM | POA: Insufficient documentation

## 2010-09-16 NOTE — Progress Notes (Signed)
  Subjective:    Patient ID: Alexa Hunter, female    DOB: 04/25/1986, 25 y.o.   MRN: [id number]  HPI  Here for her pap. She is doing well. She would like STD testing. Had some heavy discharge as well but says she has been using condoms very consistently.    Weight loss - She has lost 6 lbs and has really changed her diet. She is working 6 days a week so hasn't really been able to work out like she wants to.   Review of Systems     Objective:   Physical Exam  Genitourinary: Vagina normal and uterus normal. There is no tenderness on the right labia. There is no tenderness on the left labia. Cervix exhibits no motion tenderness, no discharge and no friability. Right adnexum displays no mass. Left adnexum displays no mass. No vaginal discharge found.          Assessment & Plan:  Pap - normal exam. Will call with resutls. Add GC/chlam and vaginitis screen.   Weight - She is doing fantastic with her weight loss. She is taking the phehentermine every other day because she felt it made her jittery.

## 2010-09-17 ENCOUNTER — Encounter: Payer: Self-pay | Admitting: Family Medicine

## 2010-09-21 ENCOUNTER — Telehealth: Payer: Self-pay | Admitting: Family Medicine

## 2010-09-21 NOTE — Telephone Encounter (Signed)
Call patient: Pap smear is normal. Can you check to see if we sent a gonorrhea and Chlamydia on this as well.

## 2010-09-22 NOTE — Telephone Encounter (Signed)
Pt notified;added GC and chlamydia and pt is coming in for nurse visit for wet prep

## 2010-09-23 ENCOUNTER — Ambulatory Visit (INDEPENDENT_AMBULATORY_CARE_PROVIDER_SITE_OTHER): Payer: Commercial Managed Care - PPO | Admitting: Family Medicine

## 2010-09-23 DIAGNOSIS — N898 Other specified noninflammatory disorders of vagina: Secondary | ICD-10-CM

## 2010-09-23 NOTE — Progress Notes (Signed)
  Subjective:    Patient ID: Alexa Hunter, female    DOB: 13-Jun-1985, 25 y.o.   MRN: [id number]  HPI They were unable to run this on the pap so she can back for this today. So no charge.    Review of Systems     Objective:   Physical Exam        Assessment & Plan:

## 2010-09-24 ENCOUNTER — Telehealth: Payer: Self-pay | Admitting: Family Medicine

## 2010-09-24 LAB — WET PREP FOR TRICH, YEAST, CLUE: Yeast Wet Prep HPF POC: NONE SEEN

## 2010-09-24 MED ORDER — METRONIDAZOLE 500 MG PO TABS: 500.0000 mg | ORAL_TABLET | Freq: Two times a day (BID) | ORAL | Status: AC

## 2010-09-24 NOTE — Telephone Encounter (Signed)
Has bacterial vaginitis. Will send over ABX to treat. Not sexually transmitted.

## 2010-09-24 NOTE — Telephone Encounter (Signed)
LMOM informing Pt of the above 

## 2010-09-24 NOTE — Telephone Encounter (Signed)
Call patient: Pap is normal repeat in one year. 3 Chlamydia are negative.

## 2010-09-27 NOTE — Telephone Encounter (Signed)
Called and left message to call back in re results

## 2010-09-27 NOTE — Telephone Encounter (Signed)
Pt.notified

## 2010-12-10 ENCOUNTER — Telehealth: Payer: Self-pay | Admitting: Family Medicine

## 2010-12-10 ENCOUNTER — Ambulatory Visit (INDEPENDENT_AMBULATORY_CARE_PROVIDER_SITE_OTHER): Payer: Commercial Managed Care - PPO | Admitting: Family Medicine

## 2010-12-10 ENCOUNTER — Encounter: Payer: Self-pay | Admitting: Internal Medicine

## 2010-12-10 ENCOUNTER — Encounter: Payer: Self-pay | Admitting: Family Medicine

## 2010-12-10 VITALS — BP 115/68 | HR 77 | Wt 239.0 lb

## 2010-12-10 DIAGNOSIS — L309 Dermatitis, unspecified: Secondary | ICD-10-CM | POA: Insufficient documentation

## 2010-12-10 DIAGNOSIS — Z23 Encounter for immunization: Secondary | ICD-10-CM

## 2010-12-10 DIAGNOSIS — K921 Melena: Secondary | ICD-10-CM

## 2010-12-10 DIAGNOSIS — N926 Irregular menstruation, unspecified: Secondary | ICD-10-CM

## 2010-12-10 DIAGNOSIS — L259 Unspecified contact dermatitis, unspecified cause: Secondary | ICD-10-CM

## 2010-12-10 MED ORDER — TRIAMCINOLONE ACETONIDE 0.5 % EX OINT: TOPICAL_OINTMENT | Freq: Every day | CUTANEOUS | Status: DC

## 2010-12-10 NOTE — Telephone Encounter (Signed)
Pt is returning call to Kathlene November, LPN regarding her referral to GI.  She states in appt was made in [place], but says she is now living in [place], and will need her referral made in [place]. Plan:  Tried to review a telephone note, but didn't see one, and reviewed referral encounter and Selena Batten Johnson's open encounters, but didn't see an encounter for this pt, and do see where pt was referred, so forwarded this return call on to [employer], LPN. Please return pt call at [phone]. Alexa Newcomer, LPN [facility]

## 2010-12-10 NOTE — Telephone Encounter (Signed)
Rescheduled pt at [facility] for 01/05/2011 @ 3:30pm. Pt notified. KJ LPN

## 2010-12-10 NOTE — Progress Notes (Signed)
  Subjective:    Patient ID: Alexa Hunter, female    DOB: 25-Aug-1985, 25 y.o.   MRN: [id number]  HPI Noticed a bloody stool about a week ago.  Denies being constipated.  Blood was more red.  No abdominal pain.  No cramping. No ASA or NSAIDs.  Just taking her birth control. Never happened before. No pain with BM.  She feels the blood is mixed into the stool. Denies any anal sex.   Rash on her forearms. Hx of exzema. 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 LMP was 10/25/10. She is sexually active.    Review of Systems     Objective:   Physical Exam  Genitourinary:       No rectal lesion, tears, or hemorrhoids. No bloody stool.  Skin:       Chest dry erythematous skin on her forearms.          Assessment & Plan:  Bloody stools - will refer to GI for futher evaluation. I am concerned  Because she feels the blood is mixed into the stoll. She is not having any pain or constipatoin.  No family hx of GI problems.   Eczema/contact dermatitis.  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    Missed period - UPT is negative. Call if periods doesn't start in the next month. She is on birth control. This can sometimes caused a missed period.

## 2010-12-24 ENCOUNTER — Encounter: Payer: Self-pay | Admitting: *Deleted

## 2011-01-04 ENCOUNTER — Ambulatory Visit: Payer: Commercial Managed Care - PPO | Admitting: Family Medicine

## 2011-01-04 DIAGNOSIS — Z0289 Encounter for other administrative examinations: Secondary | ICD-10-CM

## 2011-01-05 ENCOUNTER — Ambulatory Visit: Payer: Commercial Managed Care - PPO | Admitting: Internal Medicine

## 2011-03-15 ENCOUNTER — Telehealth: Payer: Self-pay | Admitting: *Deleted

## 2011-03-15 NOTE — Telephone Encounter (Signed)
Pt has a cold- congestion, sore throat and very little cough. Is pregnant- just found out at ED on Sunday. What can she take OTC for her symptoms.

## 2011-03-16 NOTE — Telephone Encounter (Signed)
Tell patients to start with nondrug therapy (fluids, saline nasal spray, hard candy, etc)...avoid meds in the first trimester if possible...and, when needed, choose single-ingredient products. Decongestants. Start with oxymetazoline nasal spray (Afrin, etc) for a few days ONLY...to avoid rebound congestion. Suggest short-acting pseudoephedrine (Sudafed, etc) if needed during the second or third trimesters. There's not enough info to say whether phenylephrine (Sudafed PE, etc) is safe during pregnancy. Make sure patients understand that the decongestant products on shelves now probably contain phenylephrine. Explain that they'll have to ask for pseudoephedrine at the pharmacy. Antihistamines. Use chlorpheniramine first. It has been used for a long time and seems to be safe during pregnancy. Consider loratadine (Claritin, etc) as an alternative during the second or third trimester. Evidence suggests that it's probably safe during pregnancy. Antitussives/expectorants. Don't recommend guaifenesin or dextromethorphan for cough. They don't help much so the benefit may not outweigh any potential risks. Analgesics. Most women know to stay away from NSAIDs and aspirin, especially late in pregnancy. But alert women to avoid NSAIDs around conception and in the FIRST trimester too...due to new concerns about possible birth defects. Stick with acetaminophen (Tylenol, etc) for fever or pain.

## 2011-03-16 NOTE — Telephone Encounter (Signed)
Pt notified. KJ LPN 

## 2011-04-13 ENCOUNTER — Telehealth: Payer: Self-pay | Admitting: *Deleted

## 2011-04-13 NOTE — Telephone Encounter (Signed)
Pt notified. KJ LPN 

## 2011-04-13 NOTE — Telephone Encounter (Signed)
Pt is pregnant and is really constipated. Went to bathroom and thinks tore herself some because she is bleeding BRB. Please advise on what tom do and what she can do for the constipation

## 2011-04-13 NOTE — Telephone Encounter (Signed)
Start benefiber with each meal. Drink lots of water. Constipation is very common during pregnancy.  Eat lots of veggies too.

## 2011-04-26 DIAGNOSIS — O42919 Preterm premature rupture of membranes, unspecified as to length of time between rupture and onset of labor, unspecified trimester: Secondary | ICD-10-CM

## 2011-04-26 HISTORY — PX: CHOLECYSTECTOMY: SHX55

## 2011-06-24 ENCOUNTER — Encounter: Payer: Self-pay | Admitting: Physician Assistant

## 2011-06-24 ENCOUNTER — Other Ambulatory Visit: Payer: Self-pay | Admitting: Physician Assistant

## 2011-06-24 ENCOUNTER — Ambulatory Visit (INDEPENDENT_AMBULATORY_CARE_PROVIDER_SITE_OTHER): Payer: BC Managed Care – PPO | Admitting: Physician Assistant

## 2011-06-24 VITALS — BP 124/60 | HR 85 | Temp 98.4°F | Ht 66.0 in | Wt 250.0 lb

## 2011-06-24 DIAGNOSIS — Z331 Pregnant state, incidental: Secondary | ICD-10-CM

## 2011-06-24 DIAGNOSIS — N898 Other specified noninflammatory disorders of vagina: Secondary | ICD-10-CM

## 2011-06-24 MED ORDER — METRONIDAZOLE 500 MG PO TABS: 500.0000 mg | ORAL_TABLET | Freq: Three times a day (TID) | ORAL | Status: AC

## 2011-06-24 NOTE — Patient Instructions (Addendum)
Will send wet prep off and call with results. We will treat with metronidazole today and call on Monday if need to change treatment.

## 2011-06-24 NOTE — Progress Notes (Signed)
  Subjective:    Patient ID: Alexa Hunter, female    DOB: 1985-07-16, 26 y.o.   MRN: [id number]  HPI Patient was treated for BV 3 weeks ago at the [facility] ER. She was given Metronidazole and her symptoms resolved. She is 5 months pregnant. 4 days ago she started having the same discharge again which she describes as white with an odor. She denies any abdominal pain, no pain with urination, or fever. She does state that it itches.     Review of Systems     Objective:   Physical Exam  Constitutional: She is oriented to person, place, and time. She appears well-developed and well-nourished.  HENT:  Head: Normocephalic and atraumatic.  Cardiovascular: Normal rate, regular rhythm and normal heart sounds.   Pulmonary/Chest: Effort normal and breath sounds normal. She has no wheezes.  Abdominal: Soft. Bowel sounds are normal. She exhibits no distension. There is no tenderness.  Genitourinary: Vaginal discharge found.       Whiteish/Grey  discharge with distinct odor.   Neurological: She is alert and oriented to person, place, and time.  Psychiatric: She has a normal mood and affect. Her behavior is normal.          Assessment & Plan:  Vaginal Discharge- Will treat with Metronidzole because of being the weekend. Will call with results on Monday and change treatment if needed. Follow up if not improving or worsening.

## 2011-06-28 ENCOUNTER — Other Ambulatory Visit: Payer: Self-pay | Admitting: Physician Assistant

## 2011-06-28 DIAGNOSIS — B373 Candidiasis of vulva and vagina: Secondary | ICD-10-CM

## 2011-06-28 LAB — WET PREP, GENITAL: Trich, Wet Prep: NONE SEEN

## 2011-06-28 MED ORDER — CLOTRIMAZOLE 1 % VA CREA: 1.0000 | TOPICAL_CREAM | Freq: Two times a day (BID) | VAGINAL | Status: AC

## 2011-06-28 NOTE — Progress Notes (Signed)
Wet prep positive for yeast 

## 2011-10-21 ENCOUNTER — Ambulatory Visit (INDEPENDENT_AMBULATORY_CARE_PROVIDER_SITE_OTHER): Payer: BC Managed Care – PPO | Admitting: Physician Assistant

## 2011-10-21 ENCOUNTER — Encounter: Payer: Self-pay | Admitting: Physician Assistant

## 2011-10-21 VITALS — BP 102/62 | HR 74 | Temp 98.5°F | Ht 66.0 in | Wt 254.0 lb

## 2011-10-21 DIAGNOSIS — K5900 Constipation, unspecified: Secondary | ICD-10-CM

## 2011-10-21 MED ORDER — LACTULOSE 10 GM/15ML PO SOLN: 20.0000 g | Freq: Two times a day (BID) | ORAL | Status: AC

## 2011-10-21 NOTE — Progress Notes (Signed)
  Subjective:    Patient ID: Alexa Hunter, female    DOB: 05-27-1985, 26 y.o.   MRN: [id number]  HPI Patient presents to clinic to discuss constipation and blood in stool. She has had constipation since she was pregnant with her baby. Pecola Leisure is now 75 month old and she is still very constipated. She goes about every 2-3 days but when she goes it takes a lot of straining. For 1 month since the birth of her child she states that every times she has a bowel movement bright red blood comes out. She denies any hemrroids. She does not breastfeed. She does try to eat a high fiber diet but also eats a lot of cheese and milk. She does not like to drink a lot of fluid either. She states she is just never thirsty. Does not have a family hx of colon cancer and never had a colonoscopy. She has tried daily Miralax but doesn't help and doesn't taste good. She has tried colace with no relief. Nothing makes better or worse.     Review of Systems     Objective:   Physical Exam  Constitutional: She is oriented to person, place, and time. She appears well-developed and well-nourished.  HENT:  Head: Normocephalic and atraumatic.  Cardiovascular: Normal rate, regular rhythm and normal heart sounds.   Pulmonary/Chest: Effort normal and breath sounds normal.  Abdominal: Soft. Bowel sounds are normal. There is no tenderness.  Genitourinary: Guaiac negative stool.       No hemorrhoids seen outside of rectum.  Neurological: She is alert and oriented to person, place, and time.  Skin: Skin is warm and dry.  Psychiatric: She has a normal mood and affect. Her behavior is normal.          Assessment & Plan:  Constipation- Hemmocult negative- no blood detected today. Pt instructed if blood continues after being treated for constipation and stools are better to call office and we will get a colonoscopy.Will try lactulose. Should get a response in 12-48 hours. Once have bowel movment can stop medication and only use  as needed. If still continues to have problems with constipation we could consider linezess or amitiza daily.

## 2011-10-21 NOTE — Patient Instructions (Addendum)
Take lactulose twice a day until a soft bowel movement then use as needed. Consider diet changes and increase fiber. Follow up if still having regular constipation or blood in stool in next 3 months.  Constipation in Adults Constipation is having fewer than 2 bowel movements per week. Usually, the stools are hard. As we grow older, constipation is more common. If you try to fix constipation with laxatives, the problem may get worse. This is because laxatives taken over a long period of time make the colon muscles weaker. A low-fiber diet, not taking in enough fluids, and taking some medicines may make these problems worse. MEDICATIONS THAT MAY CAUSE CONSTIPATION  Water pills (diuretics).   Calcium channel blockers (used to control blood pressure and for the heart).   Certain pain medicines (narcotics).   Anticholinergics.   Anti-inflammatory agents.   Antacids that contain aluminum.  DISEASES THAT CONTRIBUTE TO CONSTIPATION  Diabetes.   Parkinson's disease.   Dementia.   Stroke.   Depression.   Illnesses that cause problems with salt and water metabolism.  HOME CARE INSTRUCTIONS   Constipation is usually best cared for without medicines. Increasing dietary fiber and eating more fruits and vegetables is the best way to manage constipation.   Slowly increase fiber intake to 25 to 38 grams per day. Whole grains, fruits, vegetables, and legumes are good sources of fiber. A dietitian can further help you incorporate high-fiber foods into your diet.   Drink enough water and fluids to keep your urine clear or pale yellow.   A fiber supplement may be added to your diet if you cannot get enough fiber from foods.   Increasing your activities also helps improve regularity.   Suppositories, as suggested by your caregiver, will also help. If you are using antacids, such as aluminum or calcium containing products, it will be helpful to switch to products containing magnesium if your  caregiver says it is okay.   If you have been given a liquid injection (enema) today, this is only a temporary measure. It should not be relied on for treatment of longstanding (chronic) constipation.   Stronger measures, such as magnesium sulfate, should be avoided if possible. This may cause uncontrollable diarrhea. Using magnesium sulfate may not allow you time to make it to the bathroom.  SEEK IMMEDIATE MEDICAL CARE IF:   There is bright red blood in the stool.   The constipation stays for more than 4 days.   There is belly (abdominal) or rectal pain.   You do not seem to be getting better.   You have any questions or concerns.  MAKE SURE YOU:   Understand these instructions.   Will watch your condition.   Will get help right away if you are not doing well or get worse.  Document Released: 01/08/2004 Document Revised: 03/31/2011 Document Reviewed: 03/15/2011 [facility] Patient Information 2012 [place], [place].

## 2011-12-08 ENCOUNTER — Encounter: Payer: Self-pay | Admitting: Sports Medicine

## 2011-12-08 ENCOUNTER — Ambulatory Visit (INDEPENDENT_AMBULATORY_CARE_PROVIDER_SITE_OTHER): Payer: BC Managed Care – PPO | Admitting: Sports Medicine

## 2011-12-08 VITALS — BP 115/73 | HR 67 | Temp 98.3°F | Resp 16 | Wt 253.0 lb

## 2011-12-08 DIAGNOSIS — J209 Acute bronchitis, unspecified: Secondary | ICD-10-CM

## 2011-12-08 MED ORDER — HYDROCODONE-ACETAMINOPHEN 7.5-325 MG PO TABS: 1.0000 | ORAL_TABLET | Freq: Three times a day (TID) | ORAL | Status: AC | PRN

## 2011-12-08 MED ORDER — MELOXICAM 15 MG PO TABS: ORAL_TABLET | ORAL | Status: DC

## 2011-12-08 NOTE — Progress Notes (Signed)
Patient ID: Alexa Hunter, female   DOB: 1986/02/09, 26 y.o.   MRN: [id number] Subjective:    CC: cough  [id number] is a pleasant 26 year old female comes in with a 3 day history of cough, chest congestion, and malaise. She denies any sick contacts, but does note that she's had some shortness of breath, sore throat, and chills. She has not yet tried any form of treatment. She denies any swelling in her legs, and denies any long trips. Cough is productive of yellowish sputum. There have been no nausea, or vomiting. There is no diarrhea.  Past medical history, Surgical history, Family history, Social history, Allergies, and medications have been entered into the medical record, reviewed, and no changes needed.   Review of Systems: No fevers, chills, night sweats, weight loss, chest pain, or shortness of breath.   Objective:    General: Well Developed, well nourished, and in no acute distress.  Neuro: Alert and oriented x3, extra-ocular muscles intact.  HEENT: Normocephalic, atraumatic, pupils equal round reactive to light, neck supple, no masses, no lymphadenopathy, thyroid nonpalpable.  Skin: Warm and dry, no rashes. Cardiac: Regular rate and rhythm, no murmurs rubs or gallops.  Respiratory: Clear to auscultation bilaterally. Not using accessory muscles, speaking in full sentences.coughing in examination room.   Impression and Recommendations:

## 2011-12-08 NOTE — Patient Instructions (Signed)

## 2011-12-08 NOTE — Assessment & Plan Note (Signed)
No suspicion of pneumonia at this point. Hydrocodone, Mobic. She will come back to see me in approximately a week, and if no better we'll likely do a chest x-ray, and antibiotics.

## 2012-01-31 ENCOUNTER — Ambulatory Visit: Payer: BC Managed Care – PPO | Admitting: Family Medicine

## 2012-01-31 DIAGNOSIS — Z0289 Encounter for other administrative examinations: Secondary | ICD-10-CM

## 2012-05-15 ENCOUNTER — Ambulatory Visit (INDEPENDENT_AMBULATORY_CARE_PROVIDER_SITE_OTHER): Payer: BC Managed Care – PPO | Admitting: Family Medicine

## 2012-05-15 ENCOUNTER — Encounter: Payer: Self-pay | Admitting: Family Medicine

## 2012-05-15 VITALS — BP 115/69 | HR 81 | Resp 18 | Wt 255.0 lb

## 2012-05-15 DIAGNOSIS — J029 Acute pharyngitis, unspecified: Secondary | ICD-10-CM

## 2012-05-15 DIAGNOSIS — R635 Abnormal weight gain: Secondary | ICD-10-CM

## 2012-05-15 DIAGNOSIS — K911 Postgastric surgery syndromes: Secondary | ICD-10-CM

## 2012-05-15 DIAGNOSIS — Z23 Encounter for immunization: Secondary | ICD-10-CM

## 2012-05-15 DIAGNOSIS — J329 Chronic sinusitis, unspecified: Secondary | ICD-10-CM

## 2012-05-15 LAB — POCT RAPID STREP A (OFFICE): Rapid Strep A Screen: NEGATIVE

## 2012-05-15 MED ORDER — AMOXICILLIN-POT CLAVULANATE 875-125 MG PO TABS: 1.0000 | ORAL_TABLET | Freq: Two times a day (BID) | ORAL | Status: DC

## 2012-05-15 NOTE — Patient Instructions (Signed)

## 2012-05-15 NOTE — Progress Notes (Signed)
  Subjective:    Patient ID: Alexa Hunter, female    DOB: [birth year], 27 y.o.   MRN: [id number]  HPI ST and nasal congestion x 1 week.  Ear pain x 2 days.  No fever.  No cough or cold meds.  Yellow nasal discharg.  Says feels like she is gtting worse the last 48 hours. Has been fatigued and started aching yesterday.    Obesity-she also wants to discuss her weight gain today. She had a baby less than a year ago. She was initially to lose some weight but has been unable to lose since then. Has been stable. She's been really trying to diet and eat right 3 she's been increasing her vegetables making sure she's getting pertaining cut out all fats and fried foods. And a partially her weight still hasn't really touched. She is now working night shift. She also has been having a dumping syndrome after having her gallbladder removed about 6 months ago. She says about 20 minutes after she she has cut the bathroom and is there for anywhere between 30-40 minutes because of diarrhea and abdominal discomfort and cramping. This is starting to cause problems at her job.  Review of Systems     Objective:   Physical Exam  Constitutional: She is oriented to person, place, and time. She appears well-developed and well-nourished.  HENT:  Head: Normocephalic and atraumatic.  Right Ear: External ear normal.  Left Ear: External ear normal.  Nose: Nose normal.  Mouth/Throat: Oropharynx is clear and moist.       TMs and canals are clear.   Eyes: Conjunctivae normal and EOM are normal. Pupils are equal, round, and reactive to light.  Neck: Neck supple. No thyromegaly present.  Cardiovascular: Normal rate, regular rhythm and normal heart sounds.   Pulmonary/Chest: Effort normal and breath sounds normal. She has no wheezes.  Lymphadenopathy:    She has no cervical adenopathy.  Neurological: She is alert and oriented to person, place, and time.  Skin: Skin is warm and dry.  Psychiatric: She has a normal mood  and affect.          Assessment & Plan:  Acute sinusitis - Will tx with augmentin. Cal if not better in one week. Can conitnue symptomatic care. Strep is negative.   Obesity-will check her thyroid to make sure that it's not making it more difficult for her to lose weight. If it's normal then consider a weight loss program or referral to bariatric clinic.  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  Explained to her that some patients have no problems in other patients to get this dumping syndrome. It is a little bit difficult to treat. She has increased her fiber recently which should theoretically be helping.

## 2012-05-16 ENCOUNTER — Telehealth: Payer: Self-pay | Admitting: Family Medicine

## 2012-05-16 MED ORDER — CHOLESTYRAMINE 4 G PO PACK: 1.0000 | PACK | Freq: Three times a day (TID) | ORAL | Status: DC

## 2012-05-16 NOTE — Telephone Encounter (Signed)
Alexa Hunter wants to start back on her birthcontrol. I advised her to have [facility] fax [place] her last pap and note. Do you want her to have a visit for the restart of birthcontrol?  Patient advised to start cholestyramine.

## 2012-05-16 NOTE — Telephone Encounter (Signed)
I am okay with restarting her birth control once we did the fax for her last Pap report.

## 2012-05-16 NOTE — Telephone Encounter (Signed)
Please call pt and let her know that she can try cholestyramine for her dumping syndrome for her bowels. I will send rx over to the pharmacy.

## 2012-05-20 NOTE — Telephone Encounter (Signed)
Whic birth control is she on?

## 2012-05-21 NOTE — Telephone Encounter (Signed)
Left message for patient to call back  

## 2012-05-22 MED ORDER — NORGESTIMATE-ETH ESTRADIOL 0.25-35 MG-MCG PO TABS: 1.0000 | ORAL_TABLET | Freq: Every day | ORAL | Status: DC

## 2012-05-22 NOTE — Addendum Note (Signed)
Addended by: Nani Gasser D on: 05/22/2012 02:12 PM   Modules accepted: Orders

## 2012-05-22 NOTE — Telephone Encounter (Signed)
Pt calls and states she was on the nuvaring prior to having her son and wanted to go on a pill. She doesn't remember the name of them and [facility] doesn't have anything on file for her prior to nuvaring

## 2012-05-22 NOTE — Telephone Encounter (Signed)
Will send over genreic Sprintec.

## 2012-05-24 ENCOUNTER — Telehealth: Payer: Self-pay | Admitting: *Deleted

## 2012-05-24 MED ORDER — FLUCONAZOLE 150 MG PO TABS: 150.0000 mg | ORAL_TABLET | Freq: Once | ORAL | Status: DC

## 2012-05-24 NOTE — Telephone Encounter (Signed)
rx sent

## 2012-05-24 NOTE — Telephone Encounter (Signed)
Pt calls and states she has a yeast infection from the antibiotics you put her on last week. Can you send this to her pharmacy

## 2012-07-20 ENCOUNTER — Encounter: Payer: Self-pay | Admitting: Family Medicine

## 2012-07-20 ENCOUNTER — Ambulatory Visit (INDEPENDENT_AMBULATORY_CARE_PROVIDER_SITE_OTHER): Payer: BC Managed Care – PPO | Admitting: Family Medicine

## 2012-07-20 VITALS — BP 111/72 | HR 68 | Ht 64.0 in | Wt 260.0 lb

## 2012-07-20 DIAGNOSIS — R197 Diarrhea, unspecified: Secondary | ICD-10-CM

## 2012-07-20 DIAGNOSIS — R142 Eructation: Secondary | ICD-10-CM

## 2012-07-20 DIAGNOSIS — K219 Gastro-esophageal reflux disease without esophagitis: Secondary | ICD-10-CM

## 2012-07-20 DIAGNOSIS — K921 Melena: Secondary | ICD-10-CM

## 2012-07-20 DIAGNOSIS — K5900 Constipation, unspecified: Secondary | ICD-10-CM

## 2012-07-20 DIAGNOSIS — R140 Abdominal distension (gaseous): Secondary | ICD-10-CM

## 2012-07-20 LAB — CBC WITH DIFFERENTIAL/PLATELET
Basophils Absolute: 0 10*3/uL (ref 0.0–0.1)
Basophils Relative: 1 % (ref 0–1)
Eosinophils Absolute: 0.1 10*3/uL (ref 0.0–0.7)
MCH: 27.2 pg (ref 26.0–34.0)
MCHC: 32.9 g/dL (ref 30.0–36.0)
Monocytes Absolute: 0.4 10*3/uL (ref 0.1–1.0)
Neutro Abs: 4.4 10*3/uL (ref 1.7–7.7)
Neutrophils Relative %: 55 % (ref 43–77)
RDW: 14.7 % (ref 11.5–15.5)

## 2012-07-20 MED ORDER — RANITIDINE HCL 150 MG PO TABS: 150.0000 mg | ORAL_TABLET | Freq: Two times a day (BID) | ORAL | Status: DC

## 2012-07-20 NOTE — Patient Instructions (Addendum)
Can try Align or another pro-biotic to balance the bacteria in your colon Can start Benefiber daily. Start Zantac for your heartburn Avoid any carbonated beverages, spicey foods , and acidic foods. Avoid eating late at night.  Avoid greasy foods as well.

## 2012-07-20 NOTE — Progress Notes (Signed)
Subjective:    Patient ID: Alexa Hunter, female    DOB: [birth year], 27 y.o.   MRN: [id number]  HPI Had GB removed 8 months ago and since then she ha shad intermittant diarrhea and constipatioin. When more constipated has more pain and blood in the stool.  Her weight is fluctuating.  She has been working our and eating more healthy.  Has been really bloated.  Has even gone to the ED for the blood in her stools. They did examine her as I thought that she might have her rectal tear. She reports that they told her that the blood was actually in the stool and that she was Hemoccult-positive. No significant nausea but she has been having a lot of heartburn recently. She's not taking anything for this. No fevers. No family history of Crohn's disease or ulcerative colitis. She does wonder if she might be intolerant to milk. The last time she able a serial with real milk she had diarrhea all day. Normally she drinks almond milk.   Review of Systems     BP 111/72  Pulse 68  Ht 5\' 4"  (1.626 m)  Wt 260 lb (117.935 kg)  BMI 44.61 kg/m2    No Known Allergies  Past Medical History  Diagnosis Date  . Contact dermatitis   . Asthma     Past Surgical History  Procedure Laterality Date  . None      History   Social History  . Marital Status: Single    Spouse Name: N/A    Number of Children: N/A  . Years of Education: N/A   Occupational History  . Food Service    Social History Main Topics  . Smoking status: Never Smoker   . Smokeless tobacco: Not on file  . Alcohol Use: Yes     Comment: occasional  . Drug Use: No  . Sexually Active: Not on file   Other Topics Concern  . Not on file   Social History Narrative  . No narrative on file    Family History  Problem Relation Age of Onset  . Migraines Mother     Outpatient Encounter Prescriptions as of 07/20/2012  Medication Sig Dispense Refill  . albuterol (PROVENTIL) (2.5 MG/3ML) 0.083% nebulizer solution Take 3 mg by  nebulization 3 (three) times daily as needed.        . fluticasone (FLOVENT HFA) 110 MCG/ACT inhaler Inhale 1 puff into the lungs 2 (two) times daily.        . norgestimate-ethinyl estradiol (ORTHO-CYCLEN,SPRINTEC,PREVIFEM) 0.25-35 MG-MCG tablet Take 1 tablet by mouth daily.  1 Package  4  . [DISCONTINUED] amoxicillin-clavulanate (AUGMENTIN) 875-125 MG per tablet Take 1 tablet by mouth 2 (two) times daily.  20 tablet  0  . [DISCONTINUED] cholestyramine (QUESTRAN) 4 G packet Take 1 packet by mouth 3 (three) times daily with meals.  90 each  12  . [DISCONTINUED] fluconazole (DIFLUCAN) 150 MG tablet Take 1 tablet (150 mg total) by mouth once.  1 tablet  1  . ranitidine (ZANTAC) 150 MG tablet Take 1 tablet (150 mg total) by mouth 2 (two) times daily.  60 tablet  2   No facility-administered encounter medications on file as of 07/20/2012.       Objective:   Physical Exam  Constitutional: She is oriented to person, place, and time. She appears well-developed and well-nourished.  HENT:  Head: Normocephalic and atraumatic.  Cardiovascular: Normal rate, regular rhythm and normal heart sounds.   Pulmonary/Chest: Effort normal  and breath sounds normal.  Abdominal: Soft. Bowel sounds are normal. She exhibits distension. She exhibits no mass. There is tenderness. There is no rebound and no guarding.  She does have some bloating and generalized tenderness. There she is most tender in the right lower quadrant today.  Neurological: She is alert and oriented to person, place, and time.  Skin: Skin is warm and dry.  Psychiatric: She has a normal mood and affect. Her behavior is normal.          Assessment & Plan:  Diarrhea/constipation/bloody stools-would like to refer her to for further evaluation endoscopy to rule out ulcerative colitis or Crohn's disease. Certainly a component of this could be dumping syndrome after having her gallbladder removed 8 months ago. We discussed increasing the fiber in  her diet and she also has intermittent constipation. Recommended follow up in a fire. Also recommend a trial of probiotic.  We will do blood tests for gluten and milk allergy though I explained that these tests are not completely sensitive for detecting these intolerances. Patient prefers to be referred to GI here and [facility].   GERD-reflux hygiene reviewed. Will start Zantac at 50 mg twice a day. Follow up with GI.

## 2012-07-23 LAB — GLIA (IGA/G) + TTG IGA
Gliadin IgA: 6.4 U/mL (ref <20)
Gliadin IgG: 4.3 U/mL (ref <20)

## 2012-07-23 LAB — ALLERGEN MILK: Milk IgE: <0.1 kU/L

## 2012-07-24 ENCOUNTER — Telehealth: Payer: Self-pay | Admitting: *Deleted

## 2012-07-24 NOTE — Telephone Encounter (Signed)
Pt called back & was notified of results 

## 2012-09-03 ENCOUNTER — Other Ambulatory Visit (HOSPITAL_COMMUNITY)
Admission: RE | Admit: 2012-09-03 | Discharge: 2012-09-03 | Disposition: A | Discharge status: Home / Self Care | Payer: BC Managed Care – PPO | Source: Ambulatory Visit | Attending: Family Medicine | Admitting: Family Medicine

## 2012-09-03 ENCOUNTER — Encounter: Payer: Self-pay | Admitting: Family Medicine

## 2012-09-03 ENCOUNTER — Ambulatory Visit (INDEPENDENT_AMBULATORY_CARE_PROVIDER_SITE_OTHER): Payer: BC Managed Care – PPO | Admitting: Family Medicine

## 2012-09-03 VITALS — BP 106/70 | HR 64 | Ht 64.0 in | Wt 261.0 lb

## 2012-09-03 DIAGNOSIS — Z1151 Encounter for screening for human papillomavirus (HPV): Secondary | ICD-10-CM

## 2012-09-03 DIAGNOSIS — A64 Unspecified sexually transmitted disease: Secondary | ICD-10-CM

## 2012-09-03 DIAGNOSIS — Z01419 Encounter for gynecological examination (general) (routine) without abnormal findings: Secondary | ICD-10-CM | POA: Insufficient documentation

## 2012-09-03 DIAGNOSIS — Z124 Encounter for screening for malignant neoplasm of cervix: Secondary | ICD-10-CM

## 2012-09-03 LAB — RPR

## 2012-09-03 NOTE — Patient Instructions (Addendum)
We can recheck here in about 2  months to make sure that you have cleared the infection. He can call here and we can order the labs. We'll need to be a first morning urine sample.

## 2012-09-03 NOTE — Progress Notes (Signed)
  Subjective:    Patient ID: Alexa Hunter, female    DOB: Jan 10, 1986, 27 y.o.   MRN: [id number]  HPI Here for pap smear only.  She was seen at [facility] on may fifth 2014 for pelvic pain. They did do STD testing her. She was positive for Chlamydia. She was treated on Friday. She had an [place] a little abnormal discharge and some pelvic discomfort. She says she was also told that she had a cyst on her left ovary. This may have been contributing to her pain as well the largest cyst was 3.3 cm on the left ovary. She did have multiple left ovarian cysts. She was negative for yeast, clue cells and moments. Her white count was 15,000 and was she was there. Urinalysis was negative. Abdominal pain is much improved.   Review of Systems     Objective:   Physical Exam  Constitutional: She appears well-developed and well-nourished.  Genitourinary: Vagina normal. No vaginal discharge found.  Cervix appears normal. Pap smear specimen was collected. She did have some pain in the left lower contract on bimanual exam. The uterus appears to feel normal. No external or vaginal lesions seen on exam today.  Skin: Skin is warm and dry.  Psychiatric: She has a normal mood and affect. Her behavior is normal.          Assessment & Plan:  Chlamydia-she did undergo treatment. Encouraged to recheck her urine in one to 2 months to make sure that she is cleared the infection. Recommend screening for HIV and syphilis as well. Make sure using condoms.   Pap smear-specimen collected today we will call her with a sample is available.

## 2012-09-04 LAB — HIV ANTIBODY (ROUTINE TESTING W REFLEX): HIV: NONREACTIVE

## 2012-09-06 ENCOUNTER — Telehealth: Payer: Self-pay | Admitting: *Deleted

## 2012-09-11 NOTE — Telephone Encounter (Signed)
error 

## 2012-09-13 ENCOUNTER — Encounter: Payer: Self-pay | Admitting: Family Medicine

## 2012-09-21 ENCOUNTER — Ambulatory Visit: Payer: BC Managed Care – PPO | Admitting: Family Medicine

## 2012-09-21 VITALS — BP 94/54 | HR 81 | Temp 98.3°F

## 2012-09-21 DIAGNOSIS — Z8619 Personal history of other infectious and parasitic diseases: Secondary | ICD-10-CM

## 2012-10-02 ENCOUNTER — Encounter: Payer: Self-pay | Admitting: Family Medicine

## 2012-10-02 ENCOUNTER — Ambulatory Visit (INDEPENDENT_AMBULATORY_CARE_PROVIDER_SITE_OTHER): Payer: Self-pay | Admitting: Family Medicine

## 2012-10-02 VITALS — BP 114/76 | HR 71 | Temp 98.2°F | Wt 260.0 lb

## 2012-10-02 DIAGNOSIS — N760 Acute vaginitis: Secondary | ICD-10-CM

## 2012-10-02 DIAGNOSIS — J029 Acute pharyngitis, unspecified: Secondary | ICD-10-CM

## 2012-10-02 DIAGNOSIS — R05 Cough: Secondary | ICD-10-CM

## 2012-10-02 LAB — CBC WITH DIFFERENTIAL/PLATELET
Basophils Relative: 1 % (ref 0–1)
Hemoglobin: 13.8 g/dL (ref 12.0–15.0)
Lymphocytes Relative: 28 % (ref 12–46)
Lymphs Abs: 2.4 10*3/uL (ref 0.7–4.0)
Monocytes Relative: 6 % (ref 3–12)
Neutro Abs: 5.4 10*3/uL (ref 1.7–7.7)
Neutrophils Relative %: 64 % (ref 43–77)
RBC: 5.08 MIL/uL (ref 3.87–5.11)
WBC: 8.5 10*3/uL (ref 4.0–10.5)

## 2012-10-02 LAB — WET PREP FOR TRICH, YEAST, CLUE: Trich, Wet Prep: NONE SEEN

## 2012-10-02 LAB — POCT RAPID STREP A (OFFICE): Rapid Strep A Screen: NEGATIVE

## 2012-10-02 MED ORDER — PREDNISONE 20 MG PO TABS: 40.0000 mg | ORAL_TABLET | Freq: Every day | ORAL | Status: DC

## 2012-10-02 NOTE — Addendum Note (Signed)
Addended by: Deno Etienne on: 10/02/2012 11:52 AM   Modules accepted: Orders

## 2012-10-02 NOTE — Progress Notes (Signed)
  Subjective:    Patient ID: Alexa Hunter, female    DOB: [birth year], 27 y.o.   MRN: [id number]  HPI ST x 1 week. Feels swollen.  Hard to swallow and wakes up in middle of night hard to breath bc of ST. No sweats or chills.  aHas had a productive cough x 1 week with yellow sputusm. Not sure if fever but has felt warm. No ear pain.  + runny nose. No nasal congestion.  No HA.  No GI sxs.  No known sick onctacts.   She was recently treated for chlamydia. Her partner finally went to get tested about 2 weeks later. She came back in for retesting was negative but has now noticed a change in vaginal discharge again over the last week and wants to be retested. She denies any itching or irritation. We initially found Chlamydia because of a change in her vaginal discharge. She says it's very thick. Her partner has been asymptomatic.   Review of Systems     Objective:   Physical Exam  Constitutional: She is oriented to person, place, and time. She appears well-developed and well-nourished.  HENT:  Head: Normocephalic and atraumatic.  Right Ear: External ear normal.  Left Ear: External ear normal.  Nose: Nose normal.  TMs and canals are clear. Oropharynx is mildly erythematous.  Eyes: Conjunctivae and EOM are normal. Pupils are equal, round, and reactive to light.  Neck: Neck supple. No thyromegaly present.  Cardiovascular: Normal rate, regular rhythm and normal heart sounds.   Pulmonary/Chest: Effort normal and breath sounds normal. She has no wheezes.  Lymphadenopathy:    She has no cervical adenopathy.  Neurological: She is alert and oriented to person, place, and time.  Skin: Skin is warm and dry.  Psychiatric: She has a normal mood and affect.          Assessment & Plan:  Acute bronchitis with pharyngitis - lung exam is completely clear today and I did not hear her actually caught in the brain. Her main complaint is her sore throat. Rapid strep was negative. I would like for her  to go to the lab for CBC and to be checked for mono. Will treat with 5 days of prednisone to see if this improves to person only but we'll hold off on antibiotics at this point in time.  Vaginitis-she would like to be retested for gonorrhea and Chlamydia since she's noticed a change in vaginal discharge. We will also perform a wet prep as well. Will call with results available.

## 2012-10-03 ENCOUNTER — Telehealth: Payer: Self-pay | Admitting: *Deleted

## 2012-10-03 LAB — EPSTEIN-BARR VIRUS VCA, IGM: EBV VCA IgM: <10 U/mL (ref <36.0)

## 2012-10-03 LAB — EPSTEIN-BARR VIRUS VCA, IGG: EBV VCA IgG: 160 U/mL — ABNORMAL HIGH (ref <18.0)

## 2012-10-03 LAB — MONONUCLEOSIS SCREEN: Mono Screen: NEGATIVE

## 2012-10-03 NOTE — Telephone Encounter (Signed)
Left message on vm with advice 

## 2012-10-03 NOTE — Telephone Encounter (Signed)
Pt took prednisone this morning after breakfast and has felt bad ever since. Sweating, nausea and doesn't know if this is due to med or what. Please advise.  Pt states still hasn't dropped her urine speciman off yet but will try and get someone to take her before they close today

## 2012-10-03 NOTE — Telephone Encounter (Signed)
Very possible that this could be due to prednisone especially if she's never taken it before.  Since it will help the sore throat, if this is still bothering her i'd recommend cutting back on prednisone to just a single 20mg  tablet every morning. For a total of five days or until throat pain is gone.

## 2012-10-05 LAB — GC/CHLAMYDIA PROBE AMP, URINE
Chlamydia, Swab/Urine, PCR: NEGATIVE
GC Probe Amp, Urine: NEGATIVE

## 2013-01-04 ENCOUNTER — Encounter: Payer: Self-pay | Admitting: Physician Assistant

## 2013-01-04 ENCOUNTER — Ambulatory Visit (INDEPENDENT_AMBULATORY_CARE_PROVIDER_SITE_OTHER): Payer: BC Managed Care – PPO | Admitting: Physician Assistant

## 2013-01-04 VITALS — BP 133/85 | HR 73 | Wt 261.0 lb

## 2013-01-04 DIAGNOSIS — Z111 Encounter for screening for respiratory tuberculosis: Secondary | ICD-10-CM

## 2013-01-04 DIAGNOSIS — Z0000 Encounter for general adult medical examination without abnormal findings: Secondary | ICD-10-CM

## 2013-01-04 DIAGNOSIS — Z131 Encounter for screening for diabetes mellitus: Secondary | ICD-10-CM

## 2013-01-04 DIAGNOSIS — Z23 Encounter for immunization: Secondary | ICD-10-CM

## 2013-01-04 DIAGNOSIS — Z13220 Encounter for screening for lipoid disorders: Secondary | ICD-10-CM

## 2013-01-04 MED ORDER — PHENTERMINE HCL 37.5 MG PO CAPS: 37.5000 mg | ORAL_CAPSULE | ORAL | Status: DC

## 2013-01-04 NOTE — Patient Instructions (Addendum)

## 2013-01-04 NOTE — Progress Notes (Signed)
  Subjective:     Alexa Hunter is a 27 y.o. female and is here for a comprehensive physical exam. The patient reports problems - She would like something to help with weight loss. She has cut out sodas and works out at least twice a week. She admits she can do more with diet and exercise. She has tried phentermine in the past and really gave her a jump start. .   History   Social History  . Marital Status: Single    Spouse Name: N/A    Number of Children: N/A  . Years of Education: N/A   Occupational History  . Food Service    Social History Main Topics  . Smoking status: Never Smoker   . Smokeless tobacco: Not on file  . Alcohol Use: Yes     Comment: occasional  . Drug Use: No  . Sexual Activity: Not on file   Other Topics Concern  . Not on file   Social History Narrative  . No narrative on file   Health Maintenance  Topic Date Due  . Influenza Vaccine  11/23/2012  . Pap Smear  09/03/2013  . Tetanus/tdap  12/09/2020    The following portions of the patient's history were reviewed and updated as appropriate: allergies, current medications, past family history, past medical history, past social history, past surgical history and problem list.  Review of Systems A comprehensive review of systems was negative.   Objective:    BP 133/85  Pulse 73  Wt 261 lb (118.389 kg)  BMI 44.78 kg/m2  LMP 12/21/2012 General appearance: alert, cooperative, appears stated age and morbidly obese Head: Normocephalic, without obvious abnormality, atraumatic Eyes: conjunctivae/corneas clear. PERRL, EOM's intact. Fundi benign. Ears: normal TM's and external ear canals both ears Nose: Nares normal. Septum midline. Mucosa normal. No drainage or sinus tenderness. Throat: lips, mucosa, and tongue normal; teeth and gums normal Neck: no adenopathy, no carotid bruit, no JVD, supple, symmetrical, trachea midline and thyroid not enlarged, symmetric, no tenderness/mass/nodules Back:  symmetric, no curvature. ROM normal. No CVA tenderness. Lungs: clear to auscultation bilaterally Heart: regular rate and rhythm, S1, S2 normal, no murmur, click, rub or gallop Abdomen: soft, non-tender; bowel sounds normal; no masses,  no organomegaly Extremities: extremities normal, atraumatic, no cyanosis or edema Pulses: 2+ and symmetric Skin: Skin color, texture, turgor normal. No rashes or lesions Lymph nodes: Cervical, supraclavicular, and axillary nodes normal.  Neurological: Grossly normal.   Assessment:    Healthy female exam.     Plan:    CPE- PPD done for work. Flu shot given without complications. Went ahead and gave phentermine to start. Discussed calorie counting and portion control. Follow up in 1 month. Interval training for exercise was discussed. Labs were ordered for screening. Added TSH to make sure some of weight issues are not thyroid related.  See After Visit Summary for Counseling Recommendations

## 2013-02-19 ENCOUNTER — Ambulatory Visit (INDEPENDENT_AMBULATORY_CARE_PROVIDER_SITE_OTHER): Payer: BC Managed Care – PPO | Admitting: Family Medicine

## 2013-02-19 ENCOUNTER — Encounter: Payer: Self-pay | Admitting: Family Medicine

## 2013-02-19 VITALS — BP 120/78 | HR 97 | Temp 98.5°F | Wt 259.0 lb

## 2013-02-19 DIAGNOSIS — J45901 Unspecified asthma with (acute) exacerbation: Secondary | ICD-10-CM

## 2013-02-19 DIAGNOSIS — J45909 Unspecified asthma, uncomplicated: Secondary | ICD-10-CM

## 2013-02-19 MED ORDER — AZITHROMYCIN 250 MG PO TABS: ORAL_TABLET | ORAL | Status: AC

## 2013-02-19 MED ORDER — PREDNISONE 20 MG PO TABS: ORAL_TABLET | ORAL | Status: AC

## 2013-02-19 NOTE — Progress Notes (Signed)
  Subjective:    Patient ID: Alexa Hunter, female    DOB: 03-04-1986, 27 y.o.   MRN: [id number]  HPI    Review of Systems     Objective:   Physical Exam        Assessment & Plan:  Retest urine to test for cure

## 2013-02-19 NOTE — Progress Notes (Signed)
CC: Alexa Hunter is a 27 y.o. female is here for Cough   Subjective: HPI:  Complains of productive cough and wheezing that has been present for the past month worsening weekly basis. Cough is moderate in severity present all hours of the day worse at night. Wheezing is mild in severity present all hours of the day. Both are slightly improved with albuterol that she is using 2-3 times a day. She was on a five-day course of prednisone 40 mg which slightly improved her symptoms only on the days that she was taking the medication. This was stopped about a week ago. No other interventions as of yet. She reports chest tightness but no chest pain otherwise. Denies nasal congestion, reflux, rashes, dysphagia, fevers, chills, nor loss of appetite   Review Of Systems Outlined In HPI  Past Medical History  Diagnosis Date  . Contact dermatitis   . Asthma      Family History  Problem Relation Age of Onset  . Migraines Mother      History  Substance Use Topics  . Smoking status: Never Smoker   . Smokeless tobacco: Not on file  . Alcohol Use: Yes     Comment: occasional     Objective: Filed Vitals:   02/19/13 1510  BP: 120/78  Pulse: 97  Temp: 98.5 F (36.9 C)    General: Alert and Oriented, No Acute Distress HEENT: Pupils equal, round, reactive to light. Conjunctivae clear.  External ears unremarkable, canals clear with intact TMs with appropriate landmarks.  Middle ear appears open without effusion. Pink inferior turbinates.  Moist mucous membranes, pharynx without inflammation nor lesions.  Neck supple without palpable lymphadenopathy nor abnormal masses. Lungs: Clear to auscultation bilaterally, no wheezing/ronchi/rales.  Breath sounds are distant. Comfortable work of breathing Cardiac: Regular rate and rhythm. Normal S1/S2.  No murmurs, rubs, nor gallops.   Mental Status: No depression, anxiety, nor agitation. Skin: Warm and dry.  Assessment & Plan: Alexa Hunter was seen today  for cough.  Diagnoses and associated orders for this visit:  ASTHMA  Asthma with acute exacerbation - predniSONE (DELTASONE) 20 MG tablet; Three tabs daily days 1-3, two tabs daily days 4-6, one tab daily days 7-9, half tab daily days 10-13. - azithromycin (ZITHROMAX) 250 MG tablet; Take two tabs at once on day 1, then one tab daily on days 2-5.    Peak flow in the yellow zone, like her to start a longer course of prednisone starting at a higher dose start taper above. Additionally start azithromycin for asthma exacerbation. Use albuterol 2 puffs every 4 hours scheduled for the next 48 hours then as needed.  Return if symptoms worsen or fail to improve.

## 2013-05-21 ENCOUNTER — Other Ambulatory Visit (HOSPITAL_COMMUNITY)
Admission: RE | Admit: 2013-05-21 | Discharge: 2013-05-21 | Disposition: A | Discharge status: Home / Self Care | Payer: BC Managed Care – PPO | Source: Ambulatory Visit | Attending: Family Medicine | Admitting: Family Medicine

## 2013-05-21 ENCOUNTER — Encounter: Payer: Self-pay | Admitting: Family Medicine

## 2013-05-21 ENCOUNTER — Ambulatory Visit (INDEPENDENT_AMBULATORY_CARE_PROVIDER_SITE_OTHER): Payer: BC Managed Care – PPO | Admitting: Family Medicine

## 2013-05-21 VITALS — BP 108/71 | HR 85 | Temp 97.7°F | Ht 64.0 in | Wt 265.0 lb

## 2013-05-21 DIAGNOSIS — Z01419 Encounter for gynecological examination (general) (routine) without abnormal findings: Secondary | ICD-10-CM | POA: Insufficient documentation

## 2013-05-21 DIAGNOSIS — Z124 Encounter for screening for malignant neoplasm of cervix: Secondary | ICD-10-CM

## 2013-05-21 DIAGNOSIS — B351 Tinea unguium: Secondary | ICD-10-CM

## 2013-05-21 DIAGNOSIS — Z113 Encounter for screening for infections with a predominantly sexual mode of transmission: Secondary | ICD-10-CM | POA: Insufficient documentation

## 2013-05-21 DIAGNOSIS — Z8742 Personal history of other diseases of the female genital tract: Secondary | ICD-10-CM | POA: Insufficient documentation

## 2013-05-21 MED ORDER — TERBINAFINE HCL 250 MG PO TABS: 250.0000 mg | ORAL_TABLET | Freq: Every day | ORAL | Status: DC

## 2013-05-21 NOTE — Progress Notes (Signed)
   Subjective:    Patient ID: Alexa Hunter, female    DOB: Oct 15, 1985, 28 y.o.   MRN: [id number]  HPI Here for Pap smear today. She does have a history of abnormal Pap smears in the past. A friend of hers was recently diagnosed with cervical cancer at age 29 and is having to have a hysterectomy and this made her little nervous and she decided to come in today. She does have a new sexual partner that she is hoping to actually get married 2 and half a second child with. She would like me 3 readings okay.  Left great toe nail deformity-we did partial nail excision in about a year and a half ago. She said that part of the nail has not grown back since then. Eventually the nail became very thick and brittle. She does get frequent headaches years. Since then she's been getting an acrylic nail placed on it to make it look more normal. It has not grown back. She says it has a green appearance.   Review of Systems     Objective:   Physical Exam  Constitutional: She appears well-developed and well-nourished.  HENT:  Head: Normocephalic and atraumatic.  Genitourinary: Uterus normal. There is no rash on the right labia. There is no rash on the left labia. Cervix exhibits friability. Cervix exhibits no motion tenderness and no discharge. Right adnexum displays no mass, no tenderness and no fullness. Left adnexum displays no mass, no tenderness and no fullness. No erythema, tenderness or bleeding around the vagina. No foreign body around the vagina. No signs of injury around the vagina. No vaginal discharge found.    erythematous spot at 11 o'clock  Skin: Skin is warm and dry.  Left great toe nail is very thick and white. It's broken about halfway across. Still attached to the skin underneath. He does have a greenish brown discoloration over the end of the nail.  Psychiatric: She has a normal mood and affect. Her behavior is normal.          Assessment & Plan:  Pap smear/cervical cancer  screening/history of abnormal Pap smear-has been performed today. We'll also do STD testing on her Pap smear.  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 We'll treat with oral Lamisil for 3-6 months. Recommend not applying acrylic nails during that time frame.

## 2013-05-22 ENCOUNTER — Other Ambulatory Visit: Payer: Self-pay | Admitting: Family Medicine

## 2013-06-05 ENCOUNTER — Encounter: Payer: Self-pay | Admitting: Family Medicine

## 2013-06-05 ENCOUNTER — Ambulatory Visit (INDEPENDENT_AMBULATORY_CARE_PROVIDER_SITE_OTHER): Payer: BC Managed Care – PPO | Admitting: Family Medicine

## 2013-06-05 VITALS — BP 114/74 | HR 89 | Temp 98.1°F | Wt 270.0 lb

## 2013-06-05 DIAGNOSIS — J329 Chronic sinusitis, unspecified: Secondary | ICD-10-CM

## 2013-06-05 DIAGNOSIS — A499 Bacterial infection, unspecified: Secondary | ICD-10-CM

## 2013-06-05 DIAGNOSIS — B9689 Other specified bacterial agents as the cause of diseases classified elsewhere: Secondary | ICD-10-CM

## 2013-06-05 MED ORDER — AMOXICILLIN-POT CLAVULANATE 500-125 MG PO TABS: ORAL_TABLET | ORAL | Status: AC

## 2013-06-05 NOTE — Progress Notes (Signed)
CC: Alexa CzarDanielle M Hunter is a 28 y.o. female is here for cough and congestion   Subjective: HPI:  Just a little under one week of nasal congestion and facial pressure localized beneath both eyes that have been present on a daily basis slowly worsening on a daily basis. Symptoms are present all hours of the day but worse when lying down at night and first thing in the morning. dayQuil and NyQuil providing no relief. Accompanied by nonproductive cough mild in severity but all other symptoms above are at least moderate in severity. Denies fevers, chills, nausea, motor or sensory disturbances, chest pain, shortness of breath, wheezing, nor confusion or rashes   Review Of Systems Outlined In HPI  Past Medical History  Diagnosis Date  . Contact dermatitis   . Asthma   . Abnormal Pap smear of cervix     Past Surgical History  Procedure Laterality Date  . None     Family History  Problem Relation Age of Onset  . Migraines Mother     History   Social History  . Marital Status: Single    Spouse Name: N/A    Number of Children: N/A  . Years of Education: N/A   Occupational History  . Food Service     [employer]    Social History Main Topics  . Smoking status: Never Smoker   . Smokeless tobacco: Not on file  . Alcohol Use: Yes     Comment: occasional  . Drug Use: No  . Sexual Activity: Not on file   Other Topics Concern  . Not on file   Social History Narrative   Has a son.       Objective: BP 114/74  Pulse 89  Temp(Src) 98.1 F (36.7 C) (Oral)  Wt 270 lb (122.471 kg)  General: Alert and Oriented, No Acute Distress HEENT: Pupils equal, round, reactive to light. Conjunctivae clear.  External ears unremarkable, canals clear with intact TMs with appropriate landmarks.  Middle ear appears open without effusion. Erythematous and he returns with moderate mucoid discharge.  Moist mucous membranes, pharynx without inflammation nor lesions however moderate cobblestoning and  postnasal drip.  Neck supple without palpable lymphadenopathy nor abnormal masses. Lungs: Clear to auscultation bilaterally, no wheezing/ronchi/rales.  Comfortable work of breathing. Good air movement. Cardiac: Regular rate and rhythm. Normal S1/S2.  No murmurs, rubs, nor gallops.   Mental Status: No depression, anxiety, nor agitation. Skin: Warm and dry.  Assessment & Plan: Alexa Hunter was seen today for cough and congestion.  Diagnoses and associated orders for this visit:  Bacterial sinusitis  Other Orders - amoxicillin-clavulanate (AUGMENTIN) 500-125 MG per tablet; Take one by mouth every 8 hours for ten total days.    Bacterial sinusitis: Start Augmentin and consider Alka-Seltzer cold and sinus for symptoms along with as needed nasal saline washes   Return if symptoms worsen or fail to improve.

## 2013-06-06 ENCOUNTER — Encounter: Payer: Self-pay | Admitting: Emergency Medicine

## 2013-06-06 ENCOUNTER — Emergency Department (INDEPENDENT_AMBULATORY_CARE_PROVIDER_SITE_OTHER)
Admission: EM | Admit: 2013-06-06 | Discharge: 2013-06-06 | Disposition: A | Discharge status: Home / Self Care | Payer: BC Managed Care – PPO | Source: Home / Self Care | Attending: Family Medicine | Admitting: Family Medicine

## 2013-06-06 DIAGNOSIS — J069 Acute upper respiratory infection, unspecified: Secondary | ICD-10-CM

## 2013-06-06 DIAGNOSIS — J45901 Unspecified asthma with (acute) exacerbation: Secondary | ICD-10-CM

## 2013-06-06 MED ORDER — PREDNISONE 50 MG PO TABS: ORAL_TABLET | ORAL | Status: DC

## 2013-06-06 MED ORDER — METHYLPREDNISOLONE SODIUM SUCC 125 MG IJ SOLR
125.0000 mg | Freq: Once | INTRAMUSCULAR | Status: AC
  Administered 2013-06-06: 125 mg via INTRAMUSCULAR

## 2013-06-06 NOTE — ED Notes (Signed)
URI, SOB.

## 2013-06-06 NOTE — ED Provider Notes (Addendum)
CSN: [id number]     Arrival date & time 06/06/13  1914 History   First MD Initiated Contact with Patient 06/06/13 [phone]     Chief Complaint  Patient presents with  . URI      HPI URI Symptoms Onset: 3-4 days  Description: rhinorrhea, nasal congestion, cough, wheezing  Modifying factors:  Baseline asthma. Was seen by PCP yesterday. Started on augmentin. Feels that she isnt better. Increased albuterol use.   Symptoms Nasal discharge: yes Fever: no Sore throat: no Cough: yes Wheezing: yes Ear pain: no GI symptoms: no Sick contacts: yes  Red Flags  Stiff neck: no Dyspnea: chest tightness  Rash: n Swallowing difficulty: no  Sinusitis Risk Factors Headache/face pain: no Double sickening: no tooth pain: no  Allergy Risk Factors Sneezing: no Itchy scratchy throat: no Seasonal symptoms: no  Flu Risk Factors Headache: no muscle aches: no severe fatigue: no   Past Medical History  Diagnosis Date  . Contact dermatitis   . Asthma   . Abnormal Pap smear of cervix    Past Surgical History  Procedure Laterality Date  . None     Family History  Problem Relation Age of Onset  . Migraines Mother    History  Substance Use Topics  . Smoking status: Never Smoker   . Smokeless tobacco: Not on file  . Alcohol Use: Yes     Comment: occasional   OB History   Grav Para Term Preterm Abortions TAB SAB Ect Mult Living   1              Review of Systems  All other systems reviewed and are negative.      Allergies  Review of patient's allergies indicates not on file.  Home Medications   Current Outpatient Rx  Name  Route  Sig  Dispense  Refill  . amoxicillin-clavulanate (AUGMENTIN) 500-125 MG per tablet      Take one by mouth every 8 hours for ten total days.   30 tablet   0   . fluticasone (FLOVENT HFA) 110 MCG/ACT inhaler   Inhalation   Inhale 1 puff into the lungs 2 (two) times daily.           [name] MONONESSA 0.25-35 MG-MCG tablet      TAKE 1 TABLET  BY MOUTH EVERY DAY   28 tablet   5   . predniSONE (DELTASONE) 50 MG tablet      1 tab daily x 5 days   5 tablet   0   . terbinafine (LAMISIL) 250 MG tablet   Oral   Take 1 tablet (250 mg total) by mouth daily.   30 tablet   5    BP 123/87  Pulse 88  Temp(Src) 97.9 F (36.6 C) (Oral)  Ht 5\' 4"  (1.626 m)  Wt 266 lb (120.657 kg)  BMI 45.64 kg/m2  SpO2 98% Physical Exam  Constitutional: She is oriented to person, place, and time.  Obese    HENT:  Head: Normocephalic and atraumatic.  Right Ear: External ear normal.  Left Ear: External ear normal.  +nasal erythema, rhinorrhea bilaterally, + post oropharyngeal erythema    Eyes: Conjunctivae are normal. Pupils are equal, round, and reactive to light.  Neck: Normal range of motion. Neck supple.  Cardiovascular: Normal rate and regular rhythm.   Pulmonary/Chest: Effort normal.  Faint wheezes    Abdominal: Soft.  Musculoskeletal: Normal range of motion.  Neurological: She is alert and oriented to person, place, and  time.  Skin: Skin is warm.    ED Course  Procedures (including critical care time) Labs Review Labs Reviewed - No data to display Imaging Review No results found.    MDM   Final diagnoses:  URI (upper respiratory infection)  Asthma exacerbation    Likley viral induced asthma exacerbation.  Solumedrol 125 mg IM x1 Short course of prednisone.  Overall reassuring resp exam apart from trace wheezing.  Discussed with pt that sxs are likely viral, but to continue augmentin for bacterial coverage.  Discussed infectious and resp red flags at length. Follow up as needed.    The patient and/or caregiver has been counseled thoroughly with regard to treatment plan and/or medications prescribed including dosage, schedule, interactions, rationale for use, and possible side effects and they verbalize understanding. Diagnoses and expected course of recovery discussed and will return if not improved as expected  or if the condition worsens. Patient and/or caregiver verbalized understanding.         Steven Newton, MD 06/06/13 [id number] [id number]  Doree AlbeeSteven Newton, MD 06/06/13 [phone]

## 2013-06-09 ENCOUNTER — Telehealth: Payer: Self-pay | Admitting: Emergency Medicine

## 2013-06-12 ENCOUNTER — Other Ambulatory Visit: Payer: Self-pay | Admitting: *Deleted

## 2013-06-12 MED ORDER — FLUCONAZOLE 150 MG PO TABS: 150.0000 mg | ORAL_TABLET | Freq: Every day | ORAL | Status: DC

## 2013-07-11 ENCOUNTER — Ambulatory Visit (INDEPENDENT_AMBULATORY_CARE_PROVIDER_SITE_OTHER): Payer: BC Managed Care – PPO

## 2013-07-11 ENCOUNTER — Other Ambulatory Visit: Payer: Self-pay | Admitting: Sports Medicine

## 2013-07-11 ENCOUNTER — Encounter: Payer: Self-pay | Admitting: Sports Medicine

## 2013-07-11 ENCOUNTER — Ambulatory Visit (INDEPENDENT_AMBULATORY_CARE_PROVIDER_SITE_OTHER): Payer: BC Managed Care – PPO | Admitting: Sports Medicine

## 2013-07-11 VITALS — BP 125/79 | HR 86 | Ht 64.0 in | Wt 273.0 lb

## 2013-07-11 DIAGNOSIS — M222X9 Patellofemoral disorders, unspecified knee: Secondary | ICD-10-CM | POA: Insufficient documentation

## 2013-07-11 DIAGNOSIS — M25569 Pain in unspecified knee: Secondary | ICD-10-CM

## 2013-07-11 MED ORDER — MELOXICAM 15 MG PO TABS: ORAL_TABLET | ORAL | Status: DC

## 2013-07-11 NOTE — Patient Instructions (Signed)
Patellofemoral Syndrome  If you have had pain in the front of your knee for a long time, chances are good that you have patellofemoral syndrome. The word patella refers to the kneecap. Femoral (or femur) refers to the thigh bone. That is the bone the kneecap sits on. The kneecap is shaped like a triangle. Its job is to protect the knee and to improve the efficiency of your thigh muscles (quadriceps). The underside of the kneecap is made of smooth tissue (cartilage). This lets the kneecap slide up and down as the knee moves. Sometimes this cartilage becomes soft. Your healthcare provider may say the cartilage breaks down. That is patellofemoral syndrome. It can affect one knee, or both. The condition is sometimes called patellofemoral pain syndrome. That is because the condition is painful. The pain usually gets worse with activity. Sitting for a long time with the knee bent also makes the pain worse. It usually gets better with rest and proper treatment.  CAUSES   No one is sure why some people develop this problem and others do not. Runners often get it. One name for the condition is "runner's knee." However, some people run for years and never have knee pain. Certain things seem to make patellofemoral syndrome more likely. They include:  · Moving out of alignment. The kneecap is supposed to move in a straight line when the thigh muscle pulls on it. Sometimes the kneecap moves in poor alignment. That can make the knee swell and hurt. Some experts believe it also wears down the cartilage.  · Injury to the kneecap.  · Strain on the knee. This may occur during sports activity. Soccer, running, skiing and cycling can put excess stress on the knee.  · Being flat-footed or knock-kneed.  SYMPTOMS   · Knee pain.  · Pain under the kneecap. This is usually a dull, aching pain.  · Pain in the knee when doing certain things: squatting, kneeling, going up or down stairs.  · Pain in the knee when you stand up after sitting down  for awhile.  · Tightness in the knee.  · Loss of muscle strength in the thigh.  · Swelling of the knee.  DIAGNOSIS   Healthcare providers often send people with knee pain to an orthopedic caregiver. This person has special training to treat problems with bones and joints. To decide what is causing your knee pain, your caregiver will probably:  · Do a physical exam. This will probably include:  · Asking about symptoms you have noticed.  · Asking about your activities and any injuries.  · Feeling your knee. Moving it. This will help test the knee's strength. It will also check alignment (whether the knee and leg are aligned normally).  · Order some tests, such as:  · Imaging tests. They create pictures of the inside of the knee. Tests may include:  · X-rays.  · Computed tomography (CT) scan. This uses X-rays and a computer to show more detail.  · Magnetic resonance imaging (MRI). This test uses magnets, radio waves and a computer to make pictures.  TREATMENT   · Medication is almost always used first. It can relieve pain. It also can reduce swelling. Non-steroidal anti-inflammatory medicines (called NSAIDs) are usually suggested. Sometimes a stronger form is needed. A stronger form would require a prescription.  · Other treatment may be needed after the swelling goes down. Possibilities include:  · Exercise. Certain exercises can make the muscles around the knee stronger which decreases the   pressure on the knee cap. This includes the thigh muscle. Certain exercises also may be suggested to increase your flexibility.  · A knee brace. This gives the knee extra support and helps align the movement of the knee cap.  · Orthotics. These are special shoe inserts. They can help keep your leg and knee aligned.  · Surgery is sometimes needed. This is rare. Options include:  · Arthroscopy. The surgeon uses a special tool to remove any damaged pieces of the kneecap. Only a few small incisions (cuts) are needed.  · Realignment.  This is open surgery. The goals are to reduce pressure and fix the way the kneecap moves.  HOME CARE INSTRUCTIONS   · Take any medication prescribed by your healthcare provider. Follow the directions carefully.  · If your knee is swollen:  · Put ice or cold packs on it. Do this for 20 to 30 minutes, 3 to 4 times a day.  · Keep the knee raised. Make sure it is supported. Put a pillow under it.  · Rest your knee. For example, take the elevator instead of the stairs for awhile. Or, take a break from sports activity that strain your knee. Try walking or swimming instead.  · Whenever you are active:  · Use an elastic bandage on your knee. This gives it support.  · After any activity, put ice or cold packs on your knees. Do this for about 10 to 20 minutes.  · Make sure you wear shoes that give good support. Make sure they are not worn down. The heels should not slant in or out.  SEEK MEDICAL CARE IF:   · Knee pain gets worse. Or it does not go away, even after taking pain medicine.  · Swelling does not go down.  · Your thigh muscle becomes weak.  · You have an oral temperature above 102° F (38.9° C).  SEEK IMMEDIATE MEDICAL CARE IF:   You have an oral temperature above 102° F (38.9° C), not controlled by medicine.  Document Released: 03/30/2009 Document Revised: 07/04/2011 Document Reviewed: 03/30/2009  [employer]® Patient Information ©2014 [employer].

## 2013-07-11 NOTE — Assessment & Plan Note (Signed)
Right-sided, moderate, persistent. Formal physical therapy, x-rays, Mobic, return to see me in one month.

## 2013-07-11 NOTE — Progress Notes (Signed)
   Subjective:    I'm seeing this patient as a consultation for:  Dr. Linford ArnoldMetheney  CC: R Knee pain  HPI: Patient is a pleasant 28 yo female who presents with 4 day history of right knee pain. Patient has a moderate to severe sharp shooting pain in her anterior right knee worse with raising from a chair and walking up stairs. Does endorse some morning stiffness. 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 Denies numbness or tingling in the leg.   Past medical history, Surgical history, Family history not pertinant except as noted below, Social history, Allergies, and medications have been entered into the medical record, reviewed, and no changes needed.   Review of Systems: No headache, visual changes, nausea, vomiting, diarrhea, constipation, dizziness, abdominal pain, skin rash, fevers, chills, night sweats, weight loss, swollen lymph nodes, body aches, joint swelling, muscle aches, chest pain, shortness of breath, mood changes, visual or auditory hallucinations.   Objective:   General: Well Developed, well nourished, and in no acute distress.  Neuro/Psych: Alert and oriented x3, extra-ocular muscles intact, able to move all 4 extremities, sensation grossly intact. Skin: Warm and dry, no rashes noted.  Respiratory: Not using accessory muscles, speaking in full sentences, trachea midline.  Cardiovascular: Pulses palpable, no extremity edema. Abdomen: Does not appear distended. Right knee: Slightly tender to palpation over anterior knee. Pain with passive and active knee extension. Negative anterior drawer test.    Impression and Recommendations:   This case required medical decision making of moderate complexity.

## 2013-07-24 ENCOUNTER — Ambulatory Visit: Payer: BC Managed Care – PPO

## 2013-08-12 ENCOUNTER — Ambulatory Visit: Payer: BC Managed Care – PPO | Admitting: Sports Medicine

## 2013-08-12 DIAGNOSIS — Z0289 Encounter for other administrative examinations: Secondary | ICD-10-CM

## 2013-10-01 ENCOUNTER — Other Ambulatory Visit: Payer: Self-pay | Admitting: *Deleted

## 2013-10-01 MED ORDER — NORGESTIMATE-ETH ESTRADIOL 0.25-35 MG-MCG PO TABS: ORAL_TABLET | ORAL | Status: DC

## 2013-11-08 ENCOUNTER — Ambulatory Visit (INDEPENDENT_AMBULATORY_CARE_PROVIDER_SITE_OTHER): Payer: BC Managed Care – PPO | Admitting: Family Medicine

## 2013-11-08 ENCOUNTER — Encounter: Payer: Self-pay | Admitting: Family Medicine

## 2013-11-08 ENCOUNTER — Ambulatory Visit (INDEPENDENT_AMBULATORY_CARE_PROVIDER_SITE_OTHER): Payer: BC Managed Care – PPO

## 2013-11-08 VITALS — BP 128/69 | HR 73 | Wt 277.0 lb

## 2013-11-08 DIAGNOSIS — N912 Amenorrhea, unspecified: Secondary | ICD-10-CM

## 2013-11-08 DIAGNOSIS — M25579 Pain in unspecified ankle and joints of unspecified foot: Secondary | ICD-10-CM

## 2013-11-08 DIAGNOSIS — S93409A Sprain of unspecified ligament of unspecified ankle, initial encounter: Secondary | ICD-10-CM

## 2013-11-08 DIAGNOSIS — M25571 Pain in right ankle and joints of right foot: Secondary | ICD-10-CM

## 2013-11-08 DIAGNOSIS — S93401A Sprain of unspecified ligament of right ankle, initial encounter: Secondary | ICD-10-CM

## 2013-11-08 LAB — POCT URINE PREGNANCY: Preg Test, Ur: NEGATIVE

## 2013-11-08 NOTE — Patient Instructions (Signed)
Rest, elevate, and ice the ankle this weekend. If you feel it is still difficult to bear weight on Monday then please let me know and we can get you a work note. Recommend wearing a support or brace for 1-2 weeks. After this we can recommend start doing the strengthening exercises. Your x-ray was negative for fracture today.

## 2013-11-08 NOTE — Progress Notes (Signed)
   Subjective:    Patient ID: Alexa Hunter, female    DOB: 1985/07/19, 28 y.o.   MRN: [id number]  HPI Here to discuss right ankle pain and swelling that has been going on for several months. . No initial acute injury or trauma. She does tend to put more for weight on that foot especially if she stands for long periods.  Then about a week ago she tripped and stumbled while dancing and now getting an intermittant sharp pain in the middle of the ankle. No OTC meds for pain..  Noticed more swollen after work at the end of the day. Both ankles are actually swollen by the end of the day. But the pain is predominately in the right ankle.   Review of Systems     Objective:   Physical Exam  Constitutional: She is oriented to person, place, and time. She appears well-developed and well-nourished.  HENT:  Head: Normocephalic and atraumatic.  Musculoskeletal:  Right ankle is tender over the posterior sheath of the lateral malleolus. She's also tender just below the malleolus. No increased laxity. Strength in all 4 directions is 5 out of 5. Dorsal pedal pulses 2+ bilaterally. She does have some trace ankle edema bilaterally.  Neurological: She is alert and oriented to person, place, and time.  Skin: Skin is warm and dry.  Psychiatric: She has a normal mood and affect.          Assessment & Plan:  Right ankle pain-most likely sprain I would like to get x-ray films since she is having difficulty walking on it by the end of the day and she did roll her ankle while dancing about a week ago. X-ray was negative for fracture. Recommend elevation ice and an anti-inflammatory over-the-counter. Will wear a brace for the next one to 2 weeks. Recommend trying to keep up the ankle the next couple days. She still having pain on Monday then we may need to write her out of work for couple more days. Given handout on exercises to do for strengthening. Call if not better in 3-4 weeks.

## 2013-11-22 ENCOUNTER — Ambulatory Visit (INDEPENDENT_AMBULATORY_CARE_PROVIDER_SITE_OTHER): Payer: BC Managed Care – PPO | Admitting: Family Medicine

## 2013-11-22 ENCOUNTER — Encounter: Payer: Self-pay | Admitting: Family Medicine

## 2013-11-22 VITALS — BP 124/72 | HR 69 | Wt 273.0 lb

## 2013-11-22 DIAGNOSIS — L609 Nail disorder, unspecified: Secondary | ICD-10-CM

## 2013-11-22 DIAGNOSIS — M79675 Pain in left toe(s): Secondary | ICD-10-CM

## 2013-11-22 DIAGNOSIS — L608 Other nail disorders: Secondary | ICD-10-CM

## 2013-11-22 DIAGNOSIS — M79609 Pain in unspecified limb: Secondary | ICD-10-CM

## 2013-11-22 MED ORDER — AMOXICILLIN-POT CLAVULANATE 500-125 MG PO TABS: ORAL_TABLET | ORAL | Status: AC

## 2013-11-22 NOTE — Progress Notes (Signed)
CC: Alexa Hunter is a 28 y.o. female is here for infected toenail   Subjective: HPI:  Left great toe pain that has been present for the past week worsening on a daily basis now moderate in severity. Worse when walking or when she's wearing a shoe. Symptoms seem to be worsening along with worsening lifting of the toenail. No interventions as of yet. Denies discharge, nor skin changes at this toe. Denies recent or remote trauma other than ingrown toenail removal a few years ago. Denies fevers, chills nor joint pain elsewhere   Review Of Systems Outlined In HPI  Past Medical History  Diagnosis Date  . Contact dermatitis   . Asthma   . Abnormal Pap smear of cervix     Past Surgical History  Procedure Laterality Date  . None     Family History  Problem Relation Age of Onset  . Migraines Mother     History   Social History  . Marital Status: Single    Spouse Name: N/A    Number of Children: N/A  . Years of Education: N/A   Occupational History  . Food Service     [employer]    Social History Main Topics  . Smoking status: Never Smoker   . Smokeless tobacco: Not on file  . Alcohol Use: Yes     Comment: occasional  . Drug Use: No  . Sexual Activity: Not on file   Other Topics Concern  . Not on file   Social History Narrative   Has a son.       Objective: BP 124/72  Pulse 69  Wt 273 lb (123.832 kg)  General: Alert and Oriented, No Acute Distress Lungs: Clear comfortable work of breathing Cardiac: Regular rate and rhythm.  Extremities: No peripheral edema.  Strong peripheral pulses.  Mental Status: No depression, anxiety, nor agitation. Skin: Warm and dry. Mild redness at the base of the left great toenail which is quite tender to touch. No pain with pressure to the plantar aspect of the great toe nor is there pain with compression of the great toe laterally. Her toenail is growing at 45 up from the [place], additionally underneath appears to be curling in  on the nailbed  Assessment & Plan: Duwayne HeckDanielle was seen today for infected toenail.  Diagnoses and associated orders for this visit:  Pain of toe of left foot - Ambulatory referral to Podiatry  Toenail deformity - Ambulatory referral to Podiatry - amoxicillin-clavulanate (AUGMENTIN) 500-125 MG per tablet; Take one by mouth every 8 hours for ten total days.    She was given a basin and instructions to soak the foot in warm Epsom salts soaks for 15 minutes twice a day until she can see podiatry for consideration of either removal of the nail or shaving down the elevated portion of her nail. Start Augmentin given evidence of mild cellulitis at the proximal nailbed  Return if symptoms worsen or fail to improve.

## 2014-01-21 ENCOUNTER — Encounter: Payer: Self-pay | Admitting: Sports Medicine

## 2014-01-21 ENCOUNTER — Ambulatory Visit (INDEPENDENT_AMBULATORY_CARE_PROVIDER_SITE_OTHER): Payer: BC Managed Care – PPO | Admitting: Sports Medicine

## 2014-01-21 VITALS — BP 113/69 | HR 88 | Ht 64.0 in | Wt 274.0 lb

## 2014-01-21 DIAGNOSIS — R300 Dysuria: Secondary | ICD-10-CM | POA: Insufficient documentation

## 2014-01-21 LAB — POCT URINALYSIS DIPSTICK
Bilirubin, UA: NEGATIVE
Glucose, UA: NEGATIVE
Leukocytes, UA: NEGATIVE
Nitrite, UA: NEGATIVE
Protein, UA: NEGATIVE
Spec Grav, UA: 1.02
Urobilinogen, UA: 0.2
pH, UA: 7.5

## 2014-01-21 MED ORDER — CEPHALEXIN 500 MG PO CAPS: 500.0000 mg | ORAL_CAPSULE | Freq: Two times a day (BID) | ORAL | Status: DC

## 2014-01-21 NOTE — Progress Notes (Signed)
  Subjective:    CC: Dysuria  HPI: This is a pleasant 28 year old female who comes in with a several-day history of urinary urgency, frequency, mild dysuria. Feels like a bladder infection. She also has a vaginal discharge. Symptoms are moderate, persistent.  Past medical history, Surgical history, Family history not pertinant except as noted below, Social history, Allergies, and medications have been entered into the medical record, reviewed, and no changes needed.   Review of Systems: No fevers, chills, night sweats, weight loss, chest pain, or shortness of breath.   Objective:    General: Well Developed, well nourished, and in no acute distress.  Neuro: Alert and oriented x3, extra-ocular muscles intact, sensation grossly intact.  HEENT: Normocephalic, atraumatic, pupils equal round reactive to light, neck supple, no masses, no lymphadenopathy, thyroid nonpalpable.  Skin: Warm and dry, no rashes. Cardiac: Regular rate and rhythm, no murmurs rubs or gallops, no lower extremity edema.  Respiratory: Clear to auscultation bilaterally. Not using accessory muscles, speaking in full sentences. Abdomen: Soft, nontender, nondistended, normal bowel sounds, no palpable masses. Pelvic: External vulva unremarkable, vault unremarkable, cervix unremarkable, adnexa unremarkable. Wet prep and gonorrhea/chlamydia swabs were taken.  Urinalysis shows microscopic hematuria.  Impression and Recommendations:

## 2014-01-21 NOTE — Assessment & Plan Note (Addendum)
Microscopic hematuria on urinalysis. Pelvic exam performed, swabs sent for gonorrhea, chlamydia, and wet prep. Keflex, awaiting urine culture.

## 2014-01-21 NOTE — Addendum Note (Signed)
Addended by: Pixie CasinoUNNINGHAM, RHONDA C on: 01/21/2014 04:56 PM   Modules accepted: Orders

## 2014-01-22 ENCOUNTER — Other Ambulatory Visit: Payer: Self-pay | Admitting: Sports Medicine

## 2014-01-22 LAB — WET PREP FOR TRICH, YEAST, CLUE: Trich, Wet Prep: NONE SEEN

## 2014-01-22 MED ORDER — FLUCONAZOLE 150 MG PO TABS: 150.0000 mg | ORAL_TABLET | Freq: Once | ORAL | Status: DC

## 2014-01-22 MED ORDER — METRONIDAZOLE 500 MG PO TABS: 500.0000 mg | ORAL_TABLET | Freq: Two times a day (BID) | ORAL | Status: AC

## 2014-01-22 NOTE — Addendum Note (Signed)
Addended by: Monica BectonHEKKEKANDAM, THOMAS J on: 01/22/2014 07:33 PM   Modules accepted: Orders

## 2014-01-23 LAB — GC/CHLAMYDIA PROBE AMP
CT Probe RNA: NEGATIVE
GC Probe RNA: NEGATIVE

## 2014-01-25 LAB — URINE CULTURE: Colony Count: >=100000

## 2014-02-24 ENCOUNTER — Encounter: Payer: Self-pay | Admitting: Sports Medicine

## 2014-03-14 ENCOUNTER — Ambulatory Visit (INDEPENDENT_AMBULATORY_CARE_PROVIDER_SITE_OTHER): Payer: BC Managed Care – PPO | Admitting: Sports Medicine

## 2014-03-14 ENCOUNTER — Encounter: Payer: Self-pay | Admitting: Sports Medicine

## 2014-03-14 VITALS — BP 110/66 | HR 68 | Ht 64.0 in | Wt 269.0 lb

## 2014-03-14 DIAGNOSIS — M722 Plantar fascial fibromatosis: Secondary | ICD-10-CM

## 2014-03-14 MED ORDER — DIAZEPAM 5 MG PO TABS: ORAL_TABLET | ORAL | Status: DC

## 2014-03-14 MED ORDER — MELOXICAM 15 MG PO TABS: ORAL_TABLET | ORAL | Status: DC

## 2014-03-14 NOTE — Progress Notes (Signed)
   Subjective:    I'm seeing this patient as a consultation for: Dr. Nani Gasseratherine Metheney  CC: Right foot pain  HPI: This is a very pleasant 28 year old female,, she comes in with a several week history of pain that she localizes on the plantar aspect of her calcaneus, moderate, persistent without radiation, worse with the first few steps in the morning and after a long day at work. She has tried some ibuprofen but that's about it.  Past medical history, Surgical history, Family history not pertinant except as noted below, Social history, Allergies, and medications have been entered into the medical record, reviewed, and no changes needed.   Review of Systems: No headache, visual changes, nausea, vomiting, diarrhea, constipation, dizziness, abdominal pain, skin rash, fevers, chills, night sweats, weight loss, swollen lymph nodes, body aches, joint swelling, muscle aches, chest pain, shortness of breath, mood changes, visual or auditory hallucinations.   Objective:   General: Well Developed, well nourished, and in no acute distress.  Neuro/Psych: Alert and oriented x3, extra-ocular muscles intact, able to move all 4 extremities, sensation grossly intact. Skin: Warm and dry, no rashes noted.  Respiratory: Not using accessory muscles, speaking in full sentences, trachea midline.  Cardiovascular: Pulses palpable, no extremity edema. Abdomen: Does not appear distended. Right Foot: No visible erythema or swelling. Range of motion is full in all directions. Strength is 5/5 in all directions. No hallux valgus. Pes planus. No abnormal callus noted. No pain over the navicular prominence, or base of fifth metatarsal. Tender to palpation of the calcaneal insertion of plantar fascia. No pain at the Achilles insertion. No pain over the calcaneal bursa. No pain of the retrocalcaneal bursa. No tenderness to palpation over the tarsals, metatarsals, or phalanges. No hallux rigidus or limitus. No  tenderness palpation over interphalangeal joints. No pain with compression of the metatarsal heads. Neurovascularly intact distally.  Impression and Recommendations:   This case required medical decision making of moderate complexity.

## 2014-03-14 NOTE — Assessment & Plan Note (Signed)
We will start conservatively. Meloxicam, continue home rehabilitation exercises with icing, return for custom orthotics. She is a candidate for interventional treatment however she does have some understandable anxiety surrounding the procedure itself. When she is ready I will give her a Valium for use before the procedure for anxiolysis.  Return to see me for custom orthotics.

## 2014-03-17 ENCOUNTER — Ambulatory Visit: Payer: BC Managed Care – PPO | Admitting: Sports Medicine

## 2014-04-07 ENCOUNTER — Ambulatory Visit (INDEPENDENT_AMBULATORY_CARE_PROVIDER_SITE_OTHER): Payer: BC Managed Care – PPO | Admitting: Sports Medicine

## 2014-04-07 ENCOUNTER — Encounter: Payer: Self-pay | Admitting: Sports Medicine

## 2014-04-07 VITALS — BP 131/87 | HR 64 | Ht 64.0 in | Wt 267.0 lb

## 2014-04-07 DIAGNOSIS — M722 Plantar fascial fibromatosis: Secondary | ICD-10-CM

## 2014-04-07 NOTE — Assessment & Plan Note (Signed)
Patient returns with persistent pain, she is amenable to trying injection which was done today. Return for custom orthotics.

## 2014-04-07 NOTE — Progress Notes (Signed)
  Subjective:    CC: Follow-up  HPI: Right plantar fasciitis: Persistent pain, localized calcaneal insertion of the plantar fascia, she does have an appointment coming up in about 2 weeks for custom orthotics. Pain is persistent and severe and she does desire interventional treatment.  Past medical history, Surgical history, Family history not pertinant except as noted below, Social history, Allergies, and medications have been entered into the medical record, reviewed, and no changes needed.   Review of Systems: No fevers, chills, night sweats, weight loss, chest pain, or shortness of breath.   Objective:    General: Well Developed, well nourished, and in no acute distress.  Neuro: Alert and oriented x3, extra-ocular muscles intact, sensation grossly intact.  HEENT: Normocephalic, atraumatic, pupils equal round reactive to light, neck supple, no masses, no lymphadenopathy, thyroid nonpalpable.  Skin: Warm and dry, no rashes. Cardiac: Regular rate and rhythm, no murmurs rubs or gallops, no lower extremity edema.  Respiratory: Clear to auscultation bilaterally. Not using accessory muscles, speaking in full sentences.  Procedure: Real-time Ultrasound Guided Injection of right plantar fascia Device: GE Logiq E  Verbal informed consent obtained.  Time-out conducted.  Noted no overlying erythema, induration, or other signs of local infection.  Skin prepped in a sterile fashion.  Local anesthesia: Topical Ethyl chloride.  With sterile technique and under real time ultrasound guidance:  1 mL kenalog 40, 2 mL lidocaine injected easily at the calcaneal insertion of the plantar fascia. Completed without difficulty  Pain immediately resolved suggesting accurate placement of the medication.  Advised to call if fevers/chills, erythema, induration, drainage, or persistent bleeding.  Images permanently stored and available for review in the ultrasound unit.  Impression: Technically successful  ultrasound guided injection.  Impression and Recommendations:

## 2014-04-10 ENCOUNTER — Telehealth: Payer: Self-pay

## 2014-04-10 MED ORDER — TRAMADOL HCL 50 MG PO TABS: ORAL_TABLET | ORAL | Status: DC

## 2014-04-10 NOTE — Telephone Encounter (Signed)
Tramadol prescription is in my box. She needs to give it some time to work.

## 2014-04-10 NOTE — Telephone Encounter (Signed)
Patient called stated that she is still having pain in her foot. She is requesting a pain medication be sent to her pharmacy. Estelle Junehonda Masyn Rostro,CMA ]

## 2014-04-10 NOTE — Telephone Encounter (Signed)
Rx has been faxed to [facility]. Benjamyn Hestand,CMA  

## 2014-04-11 ENCOUNTER — Telehealth: Payer: Self-pay | Admitting: *Deleted

## 2014-04-11 NOTE — Telephone Encounter (Signed)
Alexa Hunter was [facility]. She expressed she was still having tenderness in her foot but would give the weekend and take the tramadol and see if her foot felt any better.

## 2014-04-28 ENCOUNTER — Encounter: Payer: Self-pay | Admitting: Sports Medicine

## 2014-04-28 ENCOUNTER — Ambulatory Visit (INDEPENDENT_AMBULATORY_CARE_PROVIDER_SITE_OTHER): Payer: BC Managed Care – PPO | Admitting: Sports Medicine

## 2014-04-28 ENCOUNTER — Ambulatory Visit (INDEPENDENT_AMBULATORY_CARE_PROVIDER_SITE_OTHER): Payer: BC Managed Care – PPO

## 2014-04-28 VITALS — BP 132/69 | HR 83 | Ht 64.0 in | Wt 267.0 lb

## 2014-04-28 DIAGNOSIS — M722 Plantar fascial fibromatosis: Secondary | ICD-10-CM

## 2014-04-28 DIAGNOSIS — M7731 Calcaneal spur, right foot: Secondary | ICD-10-CM

## 2014-04-28 NOTE — Assessment & Plan Note (Signed)
Only minimal improvement after injection. Custom orthotics as above. She does work as a Investment banker, operational on hard floors all day. Return in one month.

## 2014-04-28 NOTE — Addendum Note (Signed)
Addended by: Monica Becton on: 04/28/2014 10:28 AM   Modules accepted: Orders

## 2014-04-28 NOTE — Progress Notes (Signed)

## 2014-05-14 ENCOUNTER — Ambulatory Visit (INDEPENDENT_AMBULATORY_CARE_PROVIDER_SITE_OTHER): Payer: Self-pay | Admitting: Family Medicine

## 2014-05-14 ENCOUNTER — Encounter: Payer: Self-pay | Admitting: Family Medicine

## 2014-05-14 ENCOUNTER — Ambulatory Visit (INDEPENDENT_AMBULATORY_CARE_PROVIDER_SITE_OTHER): Payer: Self-pay

## 2014-05-14 VITALS — BP 104/66 | HR 67 | Wt 268.0 lb

## 2014-05-14 DIAGNOSIS — K760 Fatty (change of) liver, not elsewhere classified: Secondary | ICD-10-CM

## 2014-05-14 DIAGNOSIS — R1031 Right lower quadrant pain: Secondary | ICD-10-CM

## 2014-05-14 MED ORDER — IOHEXOL 300 MG/ML  SOLN: 100.0000 mL | Freq: Once | INTRAMUSCULAR | Status: AC | PRN

## 2014-05-14 NOTE — Progress Notes (Signed)
   Subjective:    Patient ID: Alexa Hunter, female    DOB: 05-Mar-1986, 29 y.o.   MRN: [id number]  HPI RLQ pain started 2 days ago after trying to have a BM.  Says pain can last 30 min. Says it was 10/10 last night.  Says she is not having any urinary or vaginal sxs.  Today pain is 7/10.  Worse with sitting and bending over.  Had similar pain before but not as intense and went to ED about 3 weeks ago and was told had a cyst on her ovary. No prior abdominla surgeries. Pain is crampy and sharp.    Review of Systems     Objective:   Physical Exam  Constitutional: She is oriented to person, place, and time. She appears well-developed and well-nourished.  HENT:  Head: Normocephalic and atraumatic.  Cardiovascular: Normal rate, regular rhythm and normal heart sounds.   Pulmonary/Chest: Effort normal and breath sounds normal.  Abdominal: Soft. Bowel sounds are normal. She exhibits no distension and no mass. There is tenderness. There is no rebound and no guarding.  mildy tender in the RUQ and extremely tender in the RLQ    Neurological: She is alert and oriented to person, place, and time.  Skin: Skin is warm and dry.  Psychiatric: She has a normal mood and affect. Her behavior is normal.          Assessment & Plan:  RLQ - she has had previous ovarian cysts which certainly is a possibility. She is extremely tender on exam today. Also concerning for acute appendicitis. Having a bowel movement seems to aggravate her pain and cause it to radiate towards her right flank. Will schedule today.

## 2014-05-15 ENCOUNTER — Telehealth: Payer: Self-pay | Admitting: Family Medicine

## 2014-05-15 DIAGNOSIS — M898X9 Other specified disorders of bone, unspecified site: Secondary | ICD-10-CM

## 2014-05-15 NOTE — Telephone Encounter (Signed)
Referral placed.Alexa Hunter Alexa Hunter  

## 2014-05-15 NOTE — Telephone Encounter (Signed)
Junious Dress[facility]Connie from [facility] called today and said Alexa Hunter is scheduled to come in on Monday to have a toenail removed and they need to have a referral for this appt.

## 2014-05-26 ENCOUNTER — Ambulatory Visit: Payer: BC Managed Care – PPO | Admitting: Sports Medicine

## 2014-05-26 DIAGNOSIS — Z0289 Encounter for other administrative examinations: Secondary | ICD-10-CM

## 2014-06-30 LAB — LIPASE: Lipase: 19

## 2014-06-30 LAB — CBC AND DIFFERENTIAL
Hemoglobin: 14.6 g/dL (ref 12.0–16.0)
Platelets: 291 10*3/uL (ref 150–399)
WBC: 11.7 10*3/mL

## 2014-06-30 LAB — HEPATIC FUNCTION PANEL
ALK PHOS: 95 U/L (ref 25–125)
ALT: 16 U/L (ref 7–35)
AST: 28 U/L (ref 13–35)

## 2014-06-30 LAB — BASIC METABOLIC PANEL
CREATININE: 0.5 mg/dL (ref 0.5–1.1)
POTASSIUM: 4.6 mmol/L (ref 3.4–5.3)
Sodium: 138 mmol/L (ref 137–147)

## 2014-06-30 LAB — COMPREHENSIVE METABOLIC PANEL
CHLORIDE: 100 mmol/L
Calcium: 9.2 mg/dL

## 2014-07-09 ENCOUNTER — Encounter: Payer: Self-pay | Admitting: Family Medicine

## 2015-01-06 ENCOUNTER — Ambulatory Visit (INDEPENDENT_AMBULATORY_CARE_PROVIDER_SITE_OTHER): Payer: Self-pay | Admitting: Family Medicine

## 2015-01-06 ENCOUNTER — Encounter: Payer: Self-pay | Admitting: Family Medicine

## 2015-01-06 VITALS — BP 125/79 | HR 96 | Temp 98.5°F | Ht 64.0 in | Wt 267.0 lb

## 2015-01-06 DIAGNOSIS — A084 Viral intestinal infection, unspecified: Secondary | ICD-10-CM

## 2015-01-06 MED ORDER — PROMETHAZINE HCL 25 MG PO TABS: 25.0000 mg | ORAL_TABLET | Freq: Three times a day (TID) | ORAL | Status: DC | PRN

## 2015-01-06 NOTE — Progress Notes (Signed)
   Subjective:    Patient ID: Alexa Hunter, female    DOB: October 09, 1985, 29 y.o.   MRN: [id number]  HPI Diarrhea sxs began on sunday last episode of diarrhea was this afternoon and it was somewhat formed. she has been experiencing some chills and nausea. she vomited Sunday and  yesterday.  She tried to go to work and felt worse again.  No fever.  + chills.  4 other people sick with vomiting diarrhea. She works in the [employer] at [employer].    Review of Systems     Objective:   Physical Exam  Constitutional: She is oriented to person, place, and time. She appears well-developed and well-nourished.  HENT:  Head: Normocephalic and atraumatic.  Cardiovascular: Normal rate, regular rhythm and normal heart sounds.   Pulmonary/Chest: Effort normal and breath sounds normal.  Abdominal: Soft. Bowel sounds are normal. She exhibits no distension and no mass. There is no tenderness. There is no rebound and no guarding.  Neurological: She is alert and oriented to person, place, and time.  Skin: Skin is warm and dry.  Psychiatric: She has a normal mood and affect. Her behavior is normal.          Assessment & Plan:  Viral gastroenteritis-discussed diagnosis. Make sure hydrating well. Given a prescription for Phenergan. Follow-up if not improving in the next 48 hours. Work note provided. 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

## 2015-01-06 NOTE — Patient Instructions (Signed)

## 2015-01-15 ENCOUNTER — Telehealth: Payer: Self-pay | Admitting: *Deleted

## 2015-01-15 NOTE — Telephone Encounter (Signed)
Bariactric referral placed.

## 2015-03-12 ENCOUNTER — Ambulatory Visit (INDEPENDENT_AMBULATORY_CARE_PROVIDER_SITE_OTHER): Payer: 59 | Admitting: Family Medicine

## 2015-03-12 ENCOUNTER — Encounter: Payer: Self-pay | Admitting: Family Medicine

## 2015-03-12 VITALS — BP 123/77 | HR 70 | Temp 98.5°F | Resp 18 | Wt 273.4 lb

## 2015-03-12 DIAGNOSIS — R350 Frequency of micturition: Secondary | ICD-10-CM | POA: Diagnosis not present

## 2015-03-12 DIAGNOSIS — N3000 Acute cystitis without hematuria: Secondary | ICD-10-CM

## 2015-03-12 DIAGNOSIS — R1030 Lower abdominal pain, unspecified: Secondary | ICD-10-CM

## 2015-03-12 LAB — POCT URINALYSIS DIPSTICK
Bilirubin, UA: NEGATIVE
Glucose, UA: NEGATIVE
Ketones, UA: NEGATIVE
NITRITE UA: NEGATIVE
PH UA: 6.5
PROTEIN UA: NEGATIVE
Spec Grav, UA: 1.02
UROBILINOGEN UA: 0.2

## 2015-03-12 LAB — POCT URINE PREGNANCY: Preg Test, Ur: NEGATIVE

## 2015-03-12 MED ORDER — SULFAMETHOXAZOLE-TRIMETHOPRIM 800-160 MG PO TABS: 1.0000 | ORAL_TABLET | Freq: Two times a day (BID) | ORAL | Status: DC

## 2015-03-12 NOTE — Patient Instructions (Signed)

## 2015-03-12 NOTE — Progress Notes (Signed)
   Subjective:    Patient ID: Alexa Hunter, female    DOB: Sep 25, 1985, 29 y.o.   MRN: [id number]  HPI Abdominal Pain Lower abdomen today. States had another episode one week ago. Also having urinary frequency the last 3 days.. It feels like sharp pain when she stands up or sit down. Painful to lift her son out of the care seat.   No fever or chills,  No change in vaginal d/c. Pressing on it makes it hurt more.  Feels like her bowels are moving well. She had a BM today.   No blood in the urine. No nausea. She says infection feels very similar to when she was diagnosed with an ovarian cyst a couple months ago in the emergency department. Except this time should she feels like her pain is worse than it was before. She also had a history of an ovarian cyst a couple of years ago as well.  Review of Systems     Objective:   Physical Exam  Constitutional: She is oriented to person, place, and time. She appears well-developed and well-nourished.  HENT:  Head: Normocephalic and atraumatic.  Cardiovascular: Normal rate, regular rhythm and normal heart sounds.   Pulmonary/Chest: Effort normal and breath sounds normal.  Abdominal: Soft. Bowel sounds are normal. She exhibits no distension and no mass. There is tenderness. There is no rebound and no guarding.  + TTP suprapubically.    Neurological: She is alert and oriented to person, place, and time.  Skin: Skin is warm and dry.  Psychiatric: She has a normal mood and affect. Her behavior is normal.          Assessment & Plan:  UTI - will tx with Bactrim. Hydrate well. Call on Monday if not better. Consider pelvic US if not better.

## 2015-04-17 ENCOUNTER — Other Ambulatory Visit: Payer: Self-pay

## 2015-04-17 MED ORDER — NORGESTIMATE-ETH ESTRADIOL 0.25-35 MG-MCG PO TABS: ORAL_TABLET | ORAL | Status: DC

## 2015-04-17 NOTE — Progress Notes (Signed)
[facility]. States she discussed this with her weight loss Dr and they agreed it would be a good idea to restart.

## 2015-05-11 ENCOUNTER — Ambulatory Visit: Payer: 59 | Admitting: Family Medicine

## 2015-05-13 ENCOUNTER — Ambulatory Visit (INDEPENDENT_AMBULATORY_CARE_PROVIDER_SITE_OTHER): Payer: Self-pay | Admitting: Family Medicine

## 2015-05-13 ENCOUNTER — Encounter: Payer: Self-pay | Admitting: Family Medicine

## 2015-05-13 ENCOUNTER — Other Ambulatory Visit (HOSPITAL_COMMUNITY)
Admission: RE | Admit: 2015-05-13 | Discharge: 2015-05-13 | Disposition: A | Discharge status: Home / Self Care | Payer: Medicaid Other | Source: Ambulatory Visit | Attending: Family Medicine | Admitting: Family Medicine

## 2015-05-13 VITALS — BP 125/83 | HR 74 | Wt 280.0 lb

## 2015-05-13 DIAGNOSIS — N926 Irregular menstruation, unspecified: Secondary | ICD-10-CM

## 2015-05-13 DIAGNOSIS — Z1151 Encounter for screening for human papillomavirus (HPV): Secondary | ICD-10-CM | POA: Insufficient documentation

## 2015-05-13 DIAGNOSIS — Z8659 Personal history of other mental and behavioral disorders: Secondary | ICD-10-CM | POA: Insufficient documentation

## 2015-05-13 DIAGNOSIS — Z01419 Encounter for gynecological examination (general) (routine) without abnormal findings: Secondary | ICD-10-CM | POA: Insufficient documentation

## 2015-05-13 DIAGNOSIS — Z0000 Encounter for general adult medical examination without abnormal findings: Secondary | ICD-10-CM

## 2015-05-13 HISTORY — DX: Personal history of other mental and behavioral disorders: Z86.59

## 2015-05-13 LAB — POCT URINE PREGNANCY: Preg Test, Ur: NEGATIVE

## 2015-05-13 NOTE — Progress Notes (Signed)
Subjective:     Alexa Hunter is a 30 y.o. female and is here for a comprehensive physical exam. The patient reports no problems. She started birth control last December. She was supposed to have a period last week but did not. She started feeling nauseated and actually threw up at work on Saturday and wanted to make sure that she wasn't pregnant before she started the next round of fourth control. She has been a little bit queasy and has had some abdominal discomfort since then. No fevers chills or sweats. She does work at the hospital.  She is planning on weight loss surgery with Dr. Clent Ridges.  She is having the sleeve surgery.  No problems with sedation in the past.  She is still due for sleep apnea test.    Social History   Social History  . Marital Status: Single    Spouse Name: N/A  . Number of Children: N/A  . Years of Education: N/A   Occupational History  . Food Service     [employer]    Social History Main Topics  . Smoking status: Never Smoker   . Smokeless tobacco: Not on file  . Alcohol Use: Yes     Comment: occasional  . Drug Use: No  . Sexual Activity: Not on file   Other Topics Concern  . Not on file   Social History Narrative   Has a son.     Health Maintenance  Topic Date Due  . PAP SMEAR  05/21/2014  . INFLUENZA VACCINE  01/06/2016 (Originally 11/24/2014)  . TETANUS/TDAP  12/09/2020  . HIV Screening  Completed    The following portions of the patient's history were reviewed and updated as appropriate: allergies, current medications, past family history, past medical history, past social history, past surgical history and problem list.  Review of Systems Pertinent items noted in HPI and remainder of comprehensive ROS otherwise negative.   Objective:    BP 125/83 mmHg  Pulse 74  Wt 280 lb (127.007 kg)  SpO2 100% General appearance: alert, cooperative and appears stated age Head: Normocephalic, without obvious abnormality, atraumatic Eyes: conj  clear, EOMi, PEERLA Ears: conj clear, EOMI, pEERLA Nose: Nares normal. Septum midline. Mucosa normal. No drainage or sinus tenderness. Throat: lips, mucosa, and tongue normal; teeth and gums normal Neck: no adenopathy, no carotid bruit, no JVD, supple, symmetrical, trachea midline and thyroid not enlarged, symmetric, no tenderness/mass/nodules Back: symmetric, no curvature. ROM normal. No CVA tenderness. Lungs: clear to auscultation bilaterally Breasts: normal appearance, no masses or tenderness Heart: regular rate and rhythm, S1, S2 normal, no murmur, click, rub or gallop Abdomen: soft, non-tender; bowel sounds normal; no masses,  no organomegaly Pelvic: cervix normal in appearance, external genitalia normal, no adnexal masses or tenderness, no cervical motion tenderness, rectovaginal septum normal, uterus normal size, shape, and consistency and vagina normal without discharge Extremities: extremities normal, atraumatic, no cyanosis or edema Pulses: 2+ and symmetric Skin: Skin color, texture, turgor normal. No rashes or lesions Lymph nodes: Cervical, supraclavicular, and axillary nodes normal. Neurologic: Alert and oriented X 3, normal strength and tone. Normal symmetric reflexes. Normal coordination and gait    Assessment:    Healthy female exam.     Plan:     See After Visit Summary for Counseling Recommendations  Keep up a regular exercise program and make sure you are eating a healthy diet Try to eat 4 servings of dairy a day, or if you are lactose intolerant take a  calcium with vitamin D daily.  Your vaccines are up to date.  Pap smear performed today.    Likely recent episode of gastroenteritis. She last vomited on Saturday but has had some nausea since then. Exercising hydrated and advance diet as tolerated. If she suddenly feels worse in call us back sooner. Urine pregnancy test was negative. Okay to start new pack of birth control.  She is low risk for surgery gastric  surgery for weight loss.  She is cleared for surgery. I strongly feel that she would benefit from weight loss surgery.    Amenorrhea on birth control. This is actually not unusual. We did do a urine pregnancy test just to make sure. She is not pregnant.

## 2015-05-13 NOTE — Patient Instructions (Signed)
Keep up a regular exercise program and make sure you are eating a healthy diet.  The recommendation is currently 30 minutes 5 days per week of exercise. Try to eat 4 servings of dairy a day, or if you are lactose intolerant take a calcium with vitamin D daily.  Your vaccines are up to date.

## 2015-05-15 LAB — CYTOLOGY - PAP

## 2015-05-15 MED ORDER — METRONIDAZOLE 500 MG PO TABS: 500.0000 mg | ORAL_TABLET | Freq: Two times a day (BID) | ORAL | Status: DC

## 2015-05-15 NOTE — Addendum Note (Signed)
Addended by: Nani Gasser D on: 05/15/2015 05:10 PM   Modules accepted: Orders

## 2015-05-19 ENCOUNTER — Telehealth: Payer: Self-pay | Admitting: Family Medicine

## 2015-05-19 MED ORDER — METRONIDAZOLE 0.75 % VA GEL: 1.0000 | Freq: Two times a day (BID) | VAGINAL | Status: DC

## 2015-05-19 NOTE — Telephone Encounter (Signed)
   Prescription sent for vaginal gel.

## 2015-05-19 NOTE — Telephone Encounter (Signed)
Pt called to state she is getting nauseous every time she takes the new ABX (flagyl). Inquired if Pt was taking with food, states she is. Will route to PCP to see if a new Rx could be sent in.

## 2015-05-19 NOTE — Telephone Encounter (Signed)
Pt advised of new rx. verbalized understanding.

## 2015-05-27 ENCOUNTER — Ambulatory Visit (INDEPENDENT_AMBULATORY_CARE_PROVIDER_SITE_OTHER): Payer: Managed Care, Other (non HMO) | Admitting: Physician Assistant

## 2015-05-27 ENCOUNTER — Encounter: Payer: Self-pay | Admitting: Physician Assistant

## 2015-05-27 VITALS — BP 128/75 | HR 69 | Ht 64.0 in | Wt 280.0 lb

## 2015-05-27 DIAGNOSIS — N898 Other specified noninflammatory disorders of vagina: Secondary | ICD-10-CM | POA: Diagnosis not present

## 2015-05-27 MED ORDER — FLUCONAZOLE 150 MG PO TABS: 150.0000 mg | ORAL_TABLET | Freq: Every day | ORAL | Status: DC

## 2015-05-27 NOTE — Progress Notes (Signed)
   Subjective:    Patient ID: Alexa Hunter, female    DOB: Oct 11, 1985, 30 y.o.   MRN: [id number]  HPI Pt is a 30 yo female who presents to clinic with thick, clumpy and describes as "cottage cheese" with itching. Occurred a few days after treatment with metronidazole for BV. No fever, chills, abdominal pain, flank pain, dysuria. Not tried anything to make better.    Review of Systems  All other systems reviewed and are negative.      Objective:   Physical Exam  Constitutional: She is oriented to person, place, and time. She appears well-developed and well-nourished.  Neurological: She is alert and oriented to person, place, and time.  Psychiatric: She has a normal mood and affect. Her behavior is normal.          Assessment & Plan:  Vaginal discharge- likely yeast infection due to metronidazole treatment. Will not do wet prep and just treat today. Treated with diflucan. Per pt she has tolerated before. Take one tablet now and then repeat in 48-72 hours if symptoms persist.

## 2015-06-24 ENCOUNTER — Ambulatory Visit (INDEPENDENT_AMBULATORY_CARE_PROVIDER_SITE_OTHER): Payer: Managed Care, Other (non HMO) | Admitting: Sports Medicine

## 2015-06-24 DIAGNOSIS — M722 Plantar fascial fibromatosis: Secondary | ICD-10-CM

## 2015-06-24 MED ORDER — TRAMADOL HCL 50 MG PO TABS: ORAL_TABLET | ORAL | Status: DC

## 2015-06-24 MED ORDER — MELOXICAM 15 MG PO TABS: ORAL_TABLET | ORAL | Status: DC

## 2015-06-24 MED ORDER — LORAZEPAM 2 MG PO TABS: ORAL_TABLET | ORAL | Status: DC

## 2015-06-24 NOTE — Assessment & Plan Note (Signed)
Minimal response to an injection 2 years ago, overall does well when wearing tennis shoes with custom orthotics. Adding meloxicam, tramadol for breakthrough pain, she will decide whether she wants to do another shot, if she does she will do Ativan preprocedural anxiolysis.  Return to see me in one month, or sooner if she decides to do a shot.

## 2015-06-24 NOTE — Progress Notes (Signed)
  Subjective:    CC: Follow-up  HPI: Plantar fasciitis: Treated about 2 years ago with custom orthotics and injection, minimal response to the injection, custom orthotics to do fairly well when she wears her tennis shoes. 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 Declines injection, she is too anxious. Symptoms are severe, persistent. No radiation.  Past medical history, Surgical history, Family history not pertinant except as noted below, Social history, Allergies, and medications have been entered into the medical record, reviewed, and no changes needed.   Review of Systems: No fevers, chills, night sweats, weight loss, chest pain, or shortness of breath.   Objective:    General: Well Developed, well nourished, and in no acute distress.  Neuro: Alert and oriented x3, extra-ocular muscles intact, sensation grossly intact.  HEENT: Normocephalic, atraumatic, pupils equal round reactive to light, neck supple, no masses, no lymphadenopathy, thyroid nonpalpable.  Skin: Warm and dry, no rashes. Cardiac: Regular rate and rhythm, no murmurs rubs or gallops, no lower extremity edema.  Respiratory: Clear to auscultation bilaterally. Not using accessory muscles, speaking in full sentences.  Impression and Recommendations:   I spent 25 minutes with this patient, greater than 50% was face-to-face time counseling regarding the above diagnoses

## 2015-07-09 ENCOUNTER — Ambulatory Visit (INDEPENDENT_AMBULATORY_CARE_PROVIDER_SITE_OTHER): Payer: Managed Care, Other (non HMO) | Admitting: Osteopathic Medicine

## 2015-07-09 ENCOUNTER — Encounter: Payer: Self-pay | Admitting: Osteopathic Medicine

## 2015-07-09 VITALS — BP 132/85 | HR 82 | Ht 64.0 in | Wt 281.0 lb

## 2015-07-09 DIAGNOSIS — N926 Irregular menstruation, unspecified: Secondary | ICD-10-CM | POA: Diagnosis not present

## 2015-07-09 DIAGNOSIS — Z3009 Encounter for other general counseling and advice on contraception: Secondary | ICD-10-CM | POA: Diagnosis not present

## 2015-07-09 LAB — POCT URINE PREGNANCY: Preg Test, Ur: NEGATIVE

## 2015-07-09 NOTE — Progress Notes (Signed)
HPI: Alexa Hunter is a 30 y.o. female who presents [facility] [place] [place] today for chief complaint of:  Chief Complaint  Patient presents with  . Amenorrhea    forgot OCP    4 weeks ago missed 5 pills, so stopped the pills and waiting for period to restart. Pregnancy test at work ?Positive? faint line. Did have sex without backup method x 1.    Past medical, social and family history reviewed: Past Medical History  Diagnosis Date  . Contact dermatitis   . Asthma   . Abnormal Pap smear of cervix    Past Surgical History  Procedure Laterality Date  . None     Social History  Substance Use Topics  . Smoking status: Never Smoker   . Smokeless tobacco: Not on file  . Alcohol Use: Yes     Comment: occasional   Family History  Problem Relation Age of Onset  . Migraines Mother     Current Outpatient Prescriptions  Medication Sig Dispense Refill  . LORazepam (ATIVAN) 2 MG tablet 1 tab by mouth 2 hours before procedure, may do an additional tablet 1 hour before procedure if insufficient anxiolysis 2 tablet 0  . meloxicam (MOBIC) 15 MG tablet One tab PO qAM with breakfast for 2 weeks, then daily prn pain. 30 tablet 3  . traMADol (ULTRAM) 50 MG tablet 1-2 tabs by mouth Q8 hours, maximum 6 tabs per day. 90 tablet 0   No current facility-administered medications for this visit.   Allergies  Allergen Reactions  . Flagyl [Metronidazole] Nausea And Vomiting    Oral flagyl only      Review of Systems: CARDIAC: No  chest pain GASTROINTESTINAL: No  nausea, No  vomiting GENITOURINARY: No  abnormal genital bleeding/discharge  Exam:  BP 132/85 mmHg  Pulse 82  Ht 5\' 4"  (1.626 m)  Wt 281 lb (127.461 kg)  BMI 48.21 kg/m2 Constitutional: VS see above. General Appearance: alert, well-developed, well-nourished, NAD Psychiatric: Normal judgment/insight. Anxious mood and affect. Oriented x3.    Results for orders placed or performed in visit on  07/09/15 (from the past 72 hour(s))  POCT urine pregnancy     Status: None   Collection Time: 07/09/15  8:24 AM  Result Value Ref Range   Preg Test, Ur Negative Negative    ASSESSMENT/PLAN: Advised on potential of false-negative urine tests with early pregnancy, these tests append on the level of pregnancy hormone, blood test can usually pick up this hormone at lower levels but again if it significantly early pregnancy still may be a false negative. Offered patient serum hCG today or we can hold off and if she still hasn't habit. In 1 week or so could certainly recommend blood test. Patient given orders and instructions. Patient has new birth control pack at home, advised can start this first day of next period. Patient currently under a lot of stress as she plans her upcoming wedding. We did have a discussion about alternative forms of contraception, long-term reversible, such as arm implanter IUD. Other alternatives NuvaRing. Patient will consider these in light of family planning goals, we'll let me know if she is interested in any of these other methods.   Missed period - Plan: POCT urine pregnancy, hCG, quantitative, pregnancy  Encounter for other general counseling or advice on contraception  Return as needed.

## 2015-07-09 NOTE — Patient Instructions (Signed)
Start new birth control pill pack in the first day of your next period or within 5 days of then.   If still no period in one week, go to lab to get blood test done for pregnancy.   Any questions, let me know! Dr. Mervyn SkeetersA.

## 2015-08-28 DIAGNOSIS — J4520 Mild intermittent asthma, uncomplicated: Secondary | ICD-10-CM | POA: Insufficient documentation

## 2015-09-28 HISTORY — PX: GASTRIC BYPASS: SHX52

## 2015-10-07 ENCOUNTER — Other Ambulatory Visit: Payer: Self-pay | Admitting: Family Medicine

## 2015-10-16 ENCOUNTER — Encounter: Payer: Self-pay | Admitting: Osteopathic Medicine

## 2015-10-16 ENCOUNTER — Ambulatory Visit (INDEPENDENT_AMBULATORY_CARE_PROVIDER_SITE_OTHER): Payer: Managed Care, Other (non HMO) | Admitting: Osteopathic Medicine

## 2015-10-16 VITALS — BP 112/76 | HR 59 | Ht 64.0 in | Wt 266.0 lb

## 2015-10-16 DIAGNOSIS — Z3041 Encounter for surveillance of contraceptive pills: Secondary | ICD-10-CM | POA: Diagnosis not present

## 2015-10-16 LAB — POCT URINE PREGNANCY: Preg Test, Ur: NEGATIVE

## 2015-10-16 MED ORDER — MONONESSA 0.25-35 MG-MCG PO TABS: 1.0000 | ORAL_TABLET | Freq: Every day | ORAL | Status: DC

## 2015-10-16 NOTE — Progress Notes (Signed)
HPI: Alexa Hunter is a 30 y.o. female who [facility] [place] [place] today for chief complaint of:  Chief Complaint  Patient presents with  . Medication Refill    BIRTH CONTROL    . Context: recent gastric bypass surgery and had period after than  . Modifying factors: out of OCP for 3 weeks - tried to refill it but was told needed doctor visit. LMP 10/01/15. Recent bariatric surgery 09/28/15.  Using condoms now.     Past medical, social and family history reviewed: Past Medical History  Diagnosis Date  . Contact dermatitis   . Asthma   . Abnormal Pap smear of cervix    Past Surgical History  Procedure Laterality Date  . None     Social History  Substance Use Topics  . Smoking status: Never Smoker   . Smokeless tobacco: Not on file  . Alcohol Use: Yes     Comment: occasional   Family History  Problem Relation Age of Onset  . Migraines Mother     Current Outpatient Prescriptions  Medication Sig Dispense Refill  . MONONESSA 0.25-35 MG-MCG tablet TK 1 T PO QD  5   No current facility-administered medications for this visit.   Allergies  Allergen Reactions  . Flagyl [Metronidazole] Nausea And Vomiting    Oral flagyl only      Review of Systems: CONSTITUTIONAL:   No recent illness, \ CARDIAC: No  chest pain GENITOURINARY: No  abnormal genital bleeding/discharge   Exam:  BP 112/76 mmHg  Pulse 59  Ht 5\' 4"  (1.626 m)  Wt 266 lb (120.657 kg)  BMI 45.64 kg/m2 Constitutional: VS see above. General Appearance: alert, well-developed, well-nourished, NAD Psychiatric: Normal judgment/insight. Normal mood and affect. Oriented x3.    Results for orders placed or performed in visit on 10/16/15 (from the past 72 hour(s))  POCT urine pregnancy     Status: None   Collection Time: 10/16/15  9:31 AM  Result Value Ref Range   Preg Test, Ur Negative Negative      ASSESSMENT/PLAN: See pt instructions, re backup method contraception  until next period then start the pills   Encounter for surveillance of contraceptive pills - Plan: POCT urine pregnancy    All questions were answered. Visit summary with medication list and pertinent instructions was printed for patient to review. ER/RTC precautions were reviewed with the patient. Return as directed by PCP and as needed.

## 2015-10-16 NOTE — Patient Instructions (Addendum)
Oral Contraception Use Oral contraceptive pills (OCPs) are medicines taken to prevent pregnancy. OCPs work by preventing the ovaries from releasing eggs. The hormones in OCPs also cause the cervical mucus to thicken, preventing the sperm from entering the uterus. The hormones also cause the uterine lining to become thin, not allowing a fertilized egg to attach to the inside of the uterus. OCPs are highly effective when taken exactly as prescribed. However, OCPs do not prevent sexually transmitted diseases (STDs). Safe sex practices, such as using condoms along with an OCP, can help prevent STDs. Before taking OCPs, you may have a physical exam and Pap test. Your health care provider may also order blood tests if necessary. Your health care provider will make sure you are a good candidate for oral contraception. Discuss with your health care provider the possible side effects of the OCP you may be prescribed. When starting an OCP, it can take 2 to 3 months for the body to adjust to the changes in hormone levels in your body.  HOW TO TAKE ORAL CONTRACEPTIVE PILLS Your health care provider may advise you on how to start taking the first cycle of OCPs. Otherwise, you can:   Start on day 1 of your menstrual period. You will not need any backup contraceptive protection with this start time.   Start on the first Sunday after your menstrual period or the day you get your prescription. In these cases, you will need to use backup contraceptive protection for the first week.   Start the pill at any time of your cycle. If you take the pill within 5 days of the start of your period, you are protected against pregnancy right away. In this case, you will not need a backup form of birth control. If you start at any other time of your menstrual cycle, you will need to use another form of birth control for 7 days. If your OCP is the type called a minipill, it will protect you from pregnancy after taking it for 2 days (48  hours). After you have started taking OCPs:   If you forget to take 1 pill, take it as soon as you remember. Take the next pill at the regular time.   If you miss 2 or more pills, call your health care provider because different pills have different instructions for missed doses. Use backup birth control until your next menstrual period starts.   If you use a 28-day pack that contains inactive pills and you miss 1 of the last 7 pills (pills with no hormones), it will not matter. Throw away the rest of the non-hormone pills and start a new pill pack.  No matter which day you start the OCP, you will always start a new pack on that same day of the week. Have an extra pack of OCPs and a backup contraceptive method available in case you miss some pills or lose your OCP pack.  HOME CARE INSTRUCTIONS   Do not smoke.   Always use a condom to protect against STDs. OCPs do not protect against STDs.   Use a calendar to mark your menstrual period days.   Read the information and directions that came with your OCP. Talk to your health care provider if you have questions.  SEEK MEDICAL CARE IF:   You develop nausea and vomiting.   You have abnormal vaginal discharge or bleeding.   You develop a rash.   You miss your menstrual period.   You are losing   your hair.   You need treatment for mood swings or depression.   You get dizzy when taking the OCP.   You develop acne from taking the OCP.   You become pregnant.  SEEK IMMEDIATE MEDICAL CARE IF:   You develop chest pain.   You develop shortness of breath.   You have an uncontrolled or severe headache.   You develop numbness or slurred speech.   You develop visual problems.   You develop pain, redness, and swelling in the legs.    This information is not intended to replace advice given to you by your health care provider. Make sure you discuss any questions you have with your health care provider.   Document  Released: 03/31/2011 Document Revised: 05/02/2014 Document Reviewed: 09/30/2012 Elsevier Interactive Patient Education 2016 [employer]  

## 2015-11-23 ENCOUNTER — Ambulatory Visit (INDEPENDENT_AMBULATORY_CARE_PROVIDER_SITE_OTHER): Payer: Managed Care, Other (non HMO) | Admitting: Family Medicine

## 2015-11-23 ENCOUNTER — Ambulatory Visit (INDEPENDENT_AMBULATORY_CARE_PROVIDER_SITE_OTHER): Payer: Managed Care, Other (non HMO)

## 2015-11-23 VITALS — BP 114/74 | HR 76 | Temp 98.4°F | Wt 247.0 lb

## 2015-11-23 DIAGNOSIS — N858 Other specified noninflammatory disorders of uterus: Secondary | ICD-10-CM | POA: Diagnosis not present

## 2015-11-23 DIAGNOSIS — R1031 Right lower quadrant pain: Secondary | ICD-10-CM | POA: Diagnosis not present

## 2015-11-23 DIAGNOSIS — R300 Dysuria: Secondary | ICD-10-CM | POA: Diagnosis not present

## 2015-11-23 LAB — POCT URINALYSIS DIPSTICK
Glucose, UA: NEGATIVE
KETONES UA: 80
Leukocytes, UA: NEGATIVE
Nitrite, UA: NEGATIVE
PH UA: 6
PROTEIN UA: 30
Urobilinogen, UA: 1

## 2015-11-23 LAB — WET PREP FOR TRICH, YEAST, CLUE
CLUE CELLS WET PREP: NONE SEEN
TRICH WET PREP: NONE SEEN
WBC WET PREP: NONE SEEN
Yeast Wet Prep HPF POC: NONE SEEN

## 2015-11-23 LAB — POCT URINE PREGNANCY: Preg Test, Ur: NEGATIVE

## 2015-11-23 MED ORDER — IOPAMIDOL (ISOVUE-300) INJECTION 61%
100.0000 mL | Freq: Once | INTRAVENOUS | Status: AC | PRN
  Administered 2015-11-23: 100 mL via INTRAVENOUS

## 2015-11-23 MED ORDER — CEPHALEXIN 500 MG PO CAPS: 500.0000 mg | ORAL_CAPSULE | Freq: Two times a day (BID) | ORAL | 0 refills | Status: DC

## 2015-11-23 NOTE — Progress Notes (Signed)
Alexa Hunter is a 30 y.o. female who presents to [facility] today for right lower quadrant abdominal pain. Patient has a one-day history of worsening right lower quadrant abdominal pain. She notes pressure and denies any urinary frequency or urgency. She does note her urine is darker colored than usual. She denies any vaginal discharge or bleeding. No fevers or chills. She notes that she had quite a bit of pain when she was having sex last night which is unusual for her. She has a history of cholecystectomy but no history of appendectomy.   Past Medical History:  Diagnosis Date  . Abnormal Pap smear of cervix   . Asthma   . Contact dermatitis    Past Surgical History:  Procedure Laterality Date  . none     Social History  Substance Use Topics  . Smoking status: Never Smoker  . Smokeless tobacco: Not on file  . Alcohol use Yes     Comment: occasional   family history includes Migraines in her mother.  ROS as above:  Medications: Current Outpatient Prescriptions  Medication Sig Dispense Refill  . MONONESSA 0.25-35 MG-MCG tablet Take 1 tablet by mouth daily. 1 Package 12  . cephALEXin (KEFLEX) 500 MG capsule Take 1 capsule (500 mg total) by mouth 2 (two) times daily. 14 capsule 0   No current facility-administered medications for this visit.    Allergies  Allergen Reactions  . Flagyl [Metronidazole] Nausea And Vomiting    Oral flagyl only     Exam:  BP 114/74   Pulse 76   Temp 98.4 F (36.9 C) (Oral)   Wt 247 lb (112 kg)   BMI 42.40 kg/m  Gen: Well NAD Obese HEENT: EOMI,  MMM Lungs: Normal work of breathing. CTABL Heart: RRR no MRG Abd: NABS, Soft. Nondistended, tender to palpation right lower quadrant with rebound and guarding present. Exts: Brisk capillary refill, warm and well perfused.  Plan: Normal external genitalia. Normal  vaginal canal normal appearing cervix without pus from the cervical os or cervical motion tenderness. No masses palpated on bimanual exam however patient does experience pain with palpation of the right adnexa.  Results for orders placed or performed in visit on 11/23/15 (from the past 24 hour(s))  POCT Urinalysis Dipstick     Status: Abnormal   Collection Time: 11/23/15 11:42 AM  Result Value Ref Range   Color, UA amber    Clarity, UA clear    Glucose, UA neg    Bilirubin, UA mod    Ketones, UA 80    Spec Grav, UA >=1.030    Blood, UA mod    pH, UA 6.0    Protein, UA 30    Urobilinogen, UA 1.0    Nitrite, UA neg    Leukocytes, UA Negative Negative  POCT urine pregnancy     Status: None   Collection Time: 11/23/15 12:10 PM  Result Value Ref Range   Preg Test, Ur Negative Negative   No results found.    Assessment and Plan: 30 y.o. female with Right lower quadrant abdominal pain. Unclear etiology concerning for appendicitis. CT scan as well as CBC and CMP pending. Other labs also ordered below including wet prep GC chlamydia and urine culture. Will contact patient with results.   Orders Placed This Encounter  Procedures  . Urine Culture  . WET PREP FOR TRICH, YEAST, CLUE  . GC/Chlamydia Probe Amp  . CT  Abdomen Pelvis W Contrast    Order Specific Question:   If indicated for the ordered procedure, I authorize the administration of contrast media per Radiology protocol    Answer:   Yes    Order Specific Question:   Reason for Exam (SYMPTOM  OR DIAGNOSIS REQUIRED)    Answer:   RLQ abd pain with rebound and guarding    Order Specific Question:   Is the patient pregnant?    Answer:   No    Order Specific Question:   Preferred imaging location?    Answer:   [facility]  . CBC  . Comprehensive metabolic panel    Order Specific Question:   Has the patient fasted?    Answer:   No  . POCT Urinalysis Dipstick  . POCT urine pregnancy    Discussed warning signs or  symptoms. Please see discharge instructions. Patient expresses understanding.

## 2015-11-23 NOTE — Patient Instructions (Addendum)
Thank you for coming in today.  Get labs NOW and go to Radiology to get set up for CT scan.  Also get labs. I will be calling you this afternoon with results.  If your belly pain worsens, or you have high fever, bad vomiting, blood in your stool or black tarry stool go to the Emergency Room.     Abdominal Pain, Adult Many things can cause abdominal pain. Usually, abdominal pain is not caused by a disease and will improve without treatment. It can often be observed and treated at home. Your health care provider will do a physical exam and possibly order blood tests and X-rays to help determine the seriousness of your pain. However, in many cases, more time must pass before a clear cause of the pain can be found. Before that point, your health care provider may not know if you need more testing or further treatment. HOME CARE INSTRUCTIONS Monitor your abdominal pain for any changes. The following actions may help to alleviate any discomfort you are experiencing:  Only take over-the-counter or prescription medicines as directed by your health care provider.  Do not take laxatives unless directed to do so by your health care provider.  Try a clear liquid diet (broth, tea, or water) as directed by your health care provider. Slowly move to a bland diet as tolerated. SEEK MEDICAL CARE IF:  You have unexplained abdominal pain.  You have abdominal pain associated with nausea or diarrhea.  You have pain when you urinate or have a bowel movement.  You experience abdominal pain that wakes you in the night.  You have abdominal pain that is worsened or improved by eating food.  You have abdominal pain that is worsened with eating fatty foods.  You have a fever. SEEK IMMEDIATE MEDICAL CARE IF:  Your pain does not go away within 2 hours.  You keep throwing up (vomiting).  Your pain is felt only in portions of the abdomen, such as the right side or the left lower portion of the abdomen.  You  pass bloody or black tarry stools. MAKE SURE YOU:  Understand these instructions.  Will watch your condition.  Will get help right away if you are not doing well or get worse.   This information is not intended to replace advice given to you by your health care provider. Make sure you discuss any questions you have with your health care provider.   Document Released: 01/19/2005 Document Revised: 12/31/2014 Document Reviewed: 12/19/2012 Elsevier Interactive Patient Education [employer]

## 2015-11-24 ENCOUNTER — Other Ambulatory Visit: Payer: Self-pay | Admitting: Family Medicine

## 2015-11-24 ENCOUNTER — Telehealth: Payer: Self-pay | Admitting: Family Medicine

## 2015-11-24 ENCOUNTER — Ambulatory Visit (INDEPENDENT_AMBULATORY_CARE_PROVIDER_SITE_OTHER): Payer: Managed Care, Other (non HMO)

## 2015-11-24 DIAGNOSIS — N83201 Unspecified ovarian cyst, right side: Secondary | ICD-10-CM | POA: Diagnosis not present

## 2015-11-24 DIAGNOSIS — N949 Unspecified condition associated with female genital organs and menstrual cycle: Secondary | ICD-10-CM

## 2015-11-24 LAB — CBC
HCT: 41.6 % (ref 35.0–45.0)
HEMOGLOBIN: 13.1 g/dL (ref 11.7–15.5)
MCH: 27.3 pg (ref 27.0–33.0)
MCHC: 31.5 g/dL — AB (ref 32.0–36.0)
MCV: 86.8 fL (ref 80.0–100.0)
MPV: 9.5 fL (ref 7.5–12.5)
PLATELETS: 241 10*3/uL (ref 140–400)
RBC: 4.79 MIL/uL (ref 3.80–5.10)
RDW: 14.5 % (ref 11.0–15.0)
WBC: 8.7 10*3/uL (ref 3.8–10.8)

## 2015-11-24 LAB — COMPREHENSIVE METABOLIC PANEL
ALBUMIN: 4 g/dL (ref 3.6–5.1)
ALT: 26 U/L (ref 6–29)
AST: 21 U/L (ref 10–30)
Alkaline Phosphatase: 70 U/L (ref 33–115)
BUN: 6 mg/dL — ABNORMAL LOW (ref 7–25)
CHLORIDE: 104 mmol/L (ref 98–110)
CO2: 28 mmol/L (ref 20–31)
CREATININE: 0.69 mg/dL (ref 0.50–1.10)
Calcium: 9.1 mg/dL (ref 8.6–10.2)
Glucose, Bld: 75 mg/dL (ref 65–99)
Potassium: 4 mmol/L (ref 3.5–5.3)
SODIUM: 140 mmol/L (ref 135–146)
Total Bilirubin: 0.9 mg/dL (ref 0.2–1.2)
Total Protein: 6.8 g/dL (ref 6.1–8.1)

## 2015-11-24 LAB — GC/CHLAMYDIA PROBE AMP
CT PROBE, AMP APTIMA: NOT DETECTED
GC PROBE AMP APTIMA: NOT DETECTED

## 2015-11-24 NOTE — Progress Notes (Signed)
Orders updated. Patient will have a pelvic ultrasound today.

## 2015-11-24 NOTE — Telephone Encounter (Signed)
Call patient about her CT scan results. Feeling a bit better. Plan for ultrasound and better visualize the cyst and evaluate for blood flow

## 2015-11-25 LAB — URINE CULTURE: Colony Count: >=100000

## 2015-12-22 ENCOUNTER — Ambulatory Visit (INDEPENDENT_AMBULATORY_CARE_PROVIDER_SITE_OTHER): Payer: Managed Care, Other (non HMO) | Admitting: Obstetrics & Gynecology

## 2015-12-22 ENCOUNTER — Encounter: Payer: Self-pay | Admitting: Obstetrics & Gynecology

## 2015-12-22 VITALS — BP 113/76 | HR 64 | Ht 64.0 in | Wt 234.0 lb

## 2015-12-22 DIAGNOSIS — N83201 Unspecified ovarian cyst, right side: Secondary | ICD-10-CM | POA: Diagnosis not present

## 2015-12-22 NOTE — Progress Notes (Signed)
History:  30 y.o. G2P0101 here today for f/u of right ov cyst. Pt was seen in the ED due to severe pelvic pain.  She was dx's with an ovarian cyst. She reports that since that eval her pain has resolved but, she is worried about what she is to do next.  She has not been sexually active since the initial dx. But, does reports pain with intercourse prior to her visit with primary care.  The following portions of the patient's history were reviewed and updated as appropriate: allergies, current medications, past family history, past medical history, past social history, past surgical history and problem list.  Review of Systems:  Pertinent items are noted in HPI.  Objective:  Physical Exam Blood pressure 113/76, pulse 64, height 5\' 4"  (1.626 m), weight 234 lb (106.1 kg), last menstrual period 11/25/2015. Gen: NAD Lungs: CTA CV: RRR Abd: Soft, nontender and nondistended Pelvic: Normal appearing external genitalia; no CMT.  Normal discharge.  Small uterus, no other palpable masses, no uterine or adnexal tenderness  Labs and Imaging [place] Transvaginal Non-ob  Result Date: 11/24/2015 CLINICAL DATA:  Right ovarian cyst on CT.  Abdominal pain. EXAM: TRANSABDOMINAL AND TRANSVAGINAL ULTRASOUND OF PELVIS DOPPLER ULTRASOUND OF OVARIES TECHNIQUE: Both transabdominal and transvaginal ultrasound examinations of the pelvis were performed. Transabdominal technique was performed for global imaging of the pelvis including uterus, ovaries, adnexal regions, and pelvic cul-de-sac. It was necessary to proceed with endovaginal exam following the transabdominal exam to visualize the ovaries and endometrium. Color and duplex Doppler ultrasound was utilized to evaluate blood flow to the ovaries. COMPARISON:  CT from yesterday FINDINGS: Uterus Measurements: 8 x 4 x 5 cm. No fibroids or other mass visualized. Endometrium Thickness: 4 mm.  No focal abnormality visualized. Right ovary Measurements: 55 x 38 x 52 mm. Two avascular  cystic masses. A 35 x 21 x 32 mm mass has lace-like internal septations consistent with hemorrhagic cyst. The other cyst measures 34 x 37 x 37 mm and is anechoic except for a 2 mm nodular focus along the wall seen on image 73. Left ovary Measurements: 25 x 16 x 16 mm. Probable corpus luteum. There is a ring shaped echogenic focus measuring 4 mm without shadowing. A subtle density is seen in the left ovary on CT comparison (9:53) and this is likely developing mineralization. No fatty mass by CT. Pulsed Doppler evaluation of both ovaries demonstrates normal low-resistance arterial and venous waveforms. Other findings Small pelvic fluid with few internal echoes. IMPRESSION: 1. 35 mm hemorrhagic cyst in the right ovary. Traced and mildly complex pelvic fluid may reflect leakage as cause of pain. 2. 37 mm right ovarian cyst has a benign appearance but is complicated by single tiny nodule along the wall. Recommend follow-up in 6-12 weeks to evaluate for persistence. 3. Negative for ovarian torsion. Electronically Signed   By: Marnee SpringJonathon  Watts M.D.   On: 11/24/2015 09:33   [place] Pelvis Complete  Result Date: 11/24/2015 CLINICAL DATA:  Right ovarian cyst on CT.  Abdominal pain. EXAM: TRANSABDOMINAL AND TRANSVAGINAL ULTRASOUND OF PELVIS DOPPLER ULTRASOUND OF OVARIES TECHNIQUE: Both transabdominal and transvaginal ultrasound examinations of the pelvis were performed. Transabdominal technique was performed for global imaging of the pelvis including uterus, ovaries, adnexal regions, and pelvic cul-de-sac. It was necessary to proceed with endovaginal exam following the transabdominal exam to visualize the ovaries and endometrium. Color and duplex Doppler ultrasound was utilized to evaluate blood flow to the ovaries. COMPARISON:  CT from yesterday FINDINGS: Uterus Measurements:  8 x 4 x 5 cm. No fibroids or other mass visualized. Endometrium Thickness: 4 mm.  No focal abnormality visualized. Right ovary Measurements: 55 x 38 x 52  mm. Two avascular cystic masses. A 35 x 21 x 32 mm mass has lace-like internal septations consistent with hemorrhagic cyst. The other cyst measures 34 x 37 x 37 mm and is anechoic except for a 2 mm nodular focus along the wall seen on image 73. Left ovary Measurements: 25 x 16 x 16 mm. Probable corpus luteum. There is a ring shaped echogenic focus measuring 4 mm without shadowing. A subtle density is seen in the left ovary on CT comparison (9:53) and this is likely developing mineralization. No fatty mass by CT. Pulsed Doppler evaluation of both ovaries demonstrates normal low-resistance arterial and venous waveforms. Other findings Small pelvic fluid with few internal echoes. IMPRESSION: 1. 35 mm hemorrhagic cyst in the right ovary. Traced and mildly complex pelvic fluid may reflect leakage as cause of pain. 2. 37 mm right ovarian cyst has a benign appearance but is complicated by single tiny nodule along the wall. Recommend follow-up in 6-12 weeks to evaluate for persistence. 3. Negative for ovarian torsion. Electronically Signed   By: Marnee Spring M.D.   On: 11/24/2015 09:33   Ct Abdomen Pelvis W Contrast  Result Date: 11/23/2015 CLINICAL DATA:  Right lower quadrant pain with rebound and guarding. EXAM: CT ABDOMEN AND PELVIS WITH CONTRAST TECHNIQUE: Multidetector CT imaging of the abdomen and pelvis was performed using the standard protocol following bolus administration of intravenous contrast. CONTRAST:  ISOVUE-300 IOPAMIDOL (ISOVUE-300) INJECTION 61% COMPARISON:  CT abdomen pelvis 05/14/2014 FINDINGS: The patient vomited during the contrast infusion and there is motion on the study. The lower abdomen and pelvis were re-scanned several minutes later due to the motion. Lung bases clear without infiltrate or effusion Prior cholecystectomy. Bile ducts nondilated. Liver normal in size and contour without focal lesion. 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 No  bowel obstruction. No bowel thickening or edema. Normal appendix. Both kidneys excrete contrast symmetrically. No renal mass or obstruction. Urinary bladder normal. Septated cyst in the right adnexa measuring 5.7 x 4.0 cm. Small amount of free fluid. Normal uterus. Negative for mass or adenopathy. No acute skeletal abnormality. IMPRESSION: Septated cyst right adnexa measuring 5.7 x 4.0 cm. Small amount of free fluid. Normal appendix Electronically Signed   By: Marlan Palau M.D.   On: 11/23/2015 14:42   [place] Art/ven Flow Abd Pelv Doppler  Result Date: 11/24/2015 CLINICAL DATA:  Right ovarian cyst on CT.  Abdominal pain. EXAM: TRANSABDOMINAL AND TRANSVAGINAL ULTRASOUND OF PELVIS DOPPLER ULTRASOUND OF OVARIES TECHNIQUE: Both transabdominal and transvaginal ultrasound examinations of the pelvis were performed. Transabdominal technique was performed for global imaging of the pelvis including uterus, ovaries, adnexal regions, and pelvic cul-de-sac. It was necessary to proceed with endovaginal exam following the transabdominal exam to visualize the ovaries and endometrium. Color and duplex Doppler ultrasound was utilized to evaluate blood flow to the ovaries. COMPARISON:  CT from yesterday FINDINGS: Uterus Measurements: 8 x 4 x 5 cm. No fibroids or other mass visualized. Endometrium Thickness: 4 mm.  No focal abnormality visualized. Right ovary Measurements: 55 x 38 x 52 mm. Two avascular cystic masses. A 35 x 21 x 32 mm mass has lace-like internal septations consistent with hemorrhagic cyst. The other cyst measures 34 x 37 x 37 mm and is anechoic except for a 2 mm nodular  focus along the wall seen on image 73. Left ovary Measurements: 25 x 16 x 16 mm. Probable corpus luteum. There is a ring shaped echogenic focus measuring 4 mm without shadowing. A subtle density is seen in the left ovary on CT comparison (9:53) and this is likely developing mineralization. No fatty mass by CT. Pulsed Doppler evaluation of both ovaries  demonstrates normal low-resistance arterial and venous waveforms. Other findings Small pelvic fluid with few internal echoes. IMPRESSION: 1. 35 mm hemorrhagic cyst in the right ovary. Traced and mildly complex pelvic fluid may reflect leakage as cause of pain. 2. 37 mm right ovarian cyst has a benign appearance but is complicated by single tiny nodule along the wall. Recommend follow-up in 6-12 weeks to evaluate for persistence. 3. Negative for ovarian torsion. Electronically Signed   By: Marnee Spring M.D.   On: 11/24/2015 09:33    Assessment & Plan:  Right ovarian cyct- syuspect resolution as sx have imporved and [place] suggests spillage  Repeat TV US 6-8 weeks F/u sooner prn  Hadley Soileau L. Harraway-Smith, M.D., [place]

## 2015-12-22 NOTE — Patient Instructions (Signed)
Ovarian Cyst An ovarian cyst is a fluid-filled sac that forms on an ovary. The ovaries are small organs that produce eggs in women. Various types of cysts can form on the ovaries. Most are not cancerous. Many do not cause problems, and they often go away on their own. Some may cause symptoms and require treatment. Common types of ovarian cysts include:  Functional cysts--These cysts may occur every month during the menstrual cycle. This is normal. The cysts usually go away with the next menstrual cycle if the woman does not get pregnant. Usually, there are no symptoms with a functional cyst.  Endometrioma cysts--These cysts form from the tissue that lines the uterus. They are also called "chocolate cysts" because they become filled with blood that turns brown. This type of cyst can cause pain in the lower abdomen during intercourse and with your menstrual period.  Cystadenoma cysts--This type develops from the cells on the outside of the ovary. These cysts can get very big and cause lower abdomen pain and pain with intercourse. This type of cyst can twist on itself, cut off its blood supply, and cause severe pain. It can also easily rupture and cause a lot of pain.  Dermoid cysts--This type of cyst is sometimes found in both ovaries. These cysts may contain different kinds of body tissue, such as skin, teeth, hair, or cartilage. They usually do not cause symptoms unless they get very big.  Theca lutein cysts--These cysts occur when too much of a certain hormone (human chorionic gonadotropin) is produced and overstimulates the ovaries to produce an egg. This is most common after procedures used to assist with the conception of a baby (in vitro fertilization). CAUSES   Fertility drugs can cause a condition in which multiple large cysts are formed on the ovaries. This is called ovarian hyperstimulation syndrome.  A condition called polycystic ovary syndrome can cause hormonal imbalances that can lead to  nonfunctional ovarian cysts. SIGNS AND SYMPTOMS  Many ovarian cysts do not cause symptoms. If symptoms are present, they may include:  Pelvic pain or pressure.  Pain in the lower abdomen.  Pain during sexual intercourse.  Increasing girth (swelling) of the abdomen.  Abnormal menstrual periods.  Increasing pain with menstrual periods.  Stopping having menstrual periods without being pregnant. DIAGNOSIS  These cysts are commonly found during a routine or annual pelvic exam. Tests may be ordered to find out more about the cyst. These tests may include:  Ultrasound.  X-ray of the pelvis.  CT scan.  MRI.  Blood tests. TREATMENT  Many ovarian cysts go away on their own without treatment. Your health care provider may want to check your cyst regularly for 2-3 months to see if it changes. For women in menopause, it is particularly important to monitor a cyst closely because of the higher rate of ovarian cancer in menopausal women. When treatment is needed, it may include any of the following:  A procedure to drain the cyst (aspiration). This may be done using a long needle and ultrasound. It can also be done through a laparoscopic procedure. This involves using a thin, lighted tube with a tiny camera on the end (laparoscope) inserted through a small incision.  Surgery to remove the whole cyst. This may be done using laparoscopic surgery or an open surgery involving a larger incision in the lower abdomen.  Hormone treatment or birth control pills. These methods are sometimes used to help dissolve a cyst. HOME CARE INSTRUCTIONS   Only take over-the-counter   or prescription medicines as directed by your health care provider.  Follow up with your health care provider as directed.  Get regular pelvic exams and Pap tests. SEEK MEDICAL CARE IF:   Your periods are late, irregular, or painful, or they stop.  Your pelvic pain or abdominal pain does not go away.  Your abdomen becomes  larger or swollen.  You have pressure on your bladder or trouble emptying your bladder completely.  You have pain during sexual intercourse.  You have feelings of fullness, pressure, or discomfort in your stomach.  You lose weight for no apparent reason.  You feel generally ill.  You become constipated.  You lose your appetite.  You develop acne.  You have an increase in body and facial hair.  You are gaining weight, without changing your exercise and eating habits.  You think you are pregnant. SEEK IMMEDIATE MEDICAL CARE IF:   You have increasing abdominal pain.  You feel sick to your stomach (nauseous), and you throw up (vomit).  You develop a fever that comes on suddenly.  You have abdominal pain during a bowel movement.  Your menstrual periods become heavier than usual. MAKE SURE YOU:  Understand these instructions.  Will watch your condition.  Will get help right away if you are not doing well or get worse.   This information is not intended to replace advice given to you by your health care provider. Make sure you discuss any questions you have with your health care provider.   Document Released: 04/11/2005 Document Revised: 04/16/2013 Document Reviewed: 12/17/2012 Elsevier Interactive Patient Education 2016 [employer]  

## 2016-01-18 DIAGNOSIS — E860 Dehydration: Secondary | ICD-10-CM | POA: Insufficient documentation

## 2016-02-25 ENCOUNTER — Ambulatory Visit (INDEPENDENT_AMBULATORY_CARE_PROVIDER_SITE_OTHER): Payer: Managed Care, Other (non HMO)

## 2016-02-25 ENCOUNTER — Ambulatory Visit (INDEPENDENT_AMBULATORY_CARE_PROVIDER_SITE_OTHER): Payer: Managed Care, Other (non HMO) | Admitting: Family Medicine

## 2016-02-25 ENCOUNTER — Encounter: Payer: Self-pay | Admitting: Family Medicine

## 2016-02-25 VITALS — BP 121/64 | HR 83 | Wt 218.0 lb

## 2016-02-25 DIAGNOSIS — Z3490 Encounter for supervision of normal pregnancy, unspecified, unspecified trimester: Secondary | ICD-10-CM

## 2016-02-25 DIAGNOSIS — Z3A00 Weeks of gestation of pregnancy not specified: Secondary | ICD-10-CM | POA: Diagnosis not present

## 2016-02-25 DIAGNOSIS — N912 Amenorrhea, unspecified: Secondary | ICD-10-CM | POA: Diagnosis not present

## 2016-02-25 LAB — POCT URINE PREGNANCY: Preg Test, Ur: POSITIVE — AB

## 2016-02-25 NOTE — Patient Instructions (Signed)
Start a daily prenatal vitamin.

## 2016-02-25 NOTE — Progress Notes (Signed)
   Subjective:    Patient ID: Alexa Hunter, female    DOB: 31-May-1985, 31 y.o.   MRN: [id number]  HPI  She had weight loss surgery and is no longer on birth control.   She's here today because she has started having some abdominal discomfort and nausea and some breast tenderness. She did a home pregnancy test yesterday at home and it was positive.  She hasn't been able to sleep.  She wants to know if it'll be okay to take melatonin.   Review of Systems     Objective:   Physical Exam  Constitutional: She is oriented to person, place, and time. She appears well-developed and well-nourished.  HENT:  Head: Normocephalic and atraumatic.  Cardiovascular: Normal rate, regular rhythm and normal heart sounds.   Pulmonary/Chest: Effort normal and breath sounds normal.  Neurological: She is alert and oriented to person, place, and time.  Skin: Skin is warm and dry.  Psychiatric: She has a normal mood and affect. Her behavior is normal.       Assessment & Plan:  Pregnancy-test here in the office was positive. We'll send for quad and schedule for ultrasound since states her questionable. She said her last period was about 4 weeks ago but it was very light spotting and not a normal period for her. She may actually be closer to 8 weeks into pregnancy. Did encourage her to start a prenatal vitamin. She's also to call her bariatric surgeon and make sure that she doesn't need to be on something extra or supplemental. She has already made an appointment with OB/GYN in about 2 weeks.  Insomnia-can try Benadryl, warm shower and trying to relax.

## 2016-02-26 ENCOUNTER — Telehealth: Payer: Self-pay | Admitting: Family Medicine

## 2016-02-26 LAB — HCG, QUANTITATIVE, PREGNANCY: hCG, Beta Chain, Quant, S: 21.1 m[IU]/mL — ABNORMAL HIGH

## 2016-02-26 NOTE — Telephone Encounter (Signed)
She may be actually having a miscarriage. Her hCG Sharene ButtersQuant was so low that it made me wonder if the pregnancy was actually viable. If bleeding becomes very heavy over the next couple more blood days and [facility]. But she may actually be miscarrying the early pregnancy.

## 2016-02-26 NOTE — Telephone Encounter (Signed)
Pt advised of results and recommendation from PCP. Verbalized understanding.

## 2016-02-26 NOTE — Telephone Encounter (Signed)
Pt called clinic today stating she is having some mild abdominal cramping, Pt states it feels like the cramps she has when she is on her period. Pt also reports some pink spotting. Advised some spotting was normal during the first trimester but will route to PCP for review.

## 2016-02-29 ENCOUNTER — Ambulatory Visit: Payer: Managed Care, Other (non HMO) | Admitting: Family Medicine

## 2016-02-29 ENCOUNTER — Telehealth: Payer: Self-pay | Admitting: *Deleted

## 2016-02-29 NOTE — Telephone Encounter (Signed)
lvm informing pt to return call about appt today since [facility]. Asked that she call back ASAP to see if she has f/u with her OB/GYN about this.Loralee PacasBarkley, Lamon Rotundo [place]

## 2016-03-28 ENCOUNTER — Telehealth: Payer: Self-pay

## 2016-03-28 DIAGNOSIS — O0001 Abdominal pregnancy with intrauterine pregnancy: Secondary | ICD-10-CM

## 2016-03-28 NOTE — Telephone Encounter (Signed)
Ok to order HCG quant for today and then repeat on Thurs or Friday.

## 2016-03-28 NOTE — Telephone Encounter (Signed)
Notified patient .  She will have it completed today and Friday.

## 2016-03-29 ENCOUNTER — Encounter: Payer: Self-pay | Admitting: Family Medicine

## 2016-03-30 LAB — HCG, QUANTITATIVE, PREGNANCY: HCG, BETA CHAIN, QUANT, S: 592.2 m[IU]/mL — AB

## 2016-04-01 LAB — HCG, QUANTITATIVE, PREGNANCY: hCG, Beta Chain, Quant, S: 1699.6 m[IU]/mL — ABNORMAL HIGH

## 2016-04-11 ENCOUNTER — Encounter: Payer: Self-pay | Admitting: Family Medicine

## 2016-04-25 NOTE — L&D Delivery Note (Signed)
Delivery Summary for Alexa Hunter  Labor Events:   Preterm labor:   Rupture date:   Rupture time:   Rupture type: Artificial  Fluid Color: Clear  Induction:   Augmentation:   Complications:   Cervical ripening:          Delivery:   Episiotomy:   Lacerations:   Repair suture:   Repair # of packets:   Blood loss (ml): 150   Information for the patient's newborn:  Wells GuilesWatkins, Boy Kadiatou [[id number]][[id number]]    Delivery 02/25/2017 9:37 PM by  Vaginal, Spontaneous Delivery Sex:  female Gestational Age: 7172w2d Delivery Clinician:   Living?:         APGARS  One minute Five minutes Ten minutes  Skin color:        Heart rate:        Grimace:        Muscle tone:        Breathing:        Totals: 8  9      Presentation/position:      Resuscitation:   Cord information:    Disposition of cord blood:     Blood gases sent?  Complications:   Placenta: Delivered:       appearance Newborn Measurements: Weight: 4 lb 8 oz (2040 g)  Height:    Head circumference:    Chest circumference:    Other providers:    Additional  information: Forceps:   Vacuum:   Breech:   Observed anomalies       Delivery Note At 9:37 PM a viable and healthy preterm female was delivered via Vaginal, Spontaneous Delivery (Presentation: Vertex; ROA position).  APGAR: 8, 9; weight 4 lb 8 oz (2040 g).   Placenta status: spontaneously remvoed, intact.  Cord: 3-vessel, with the following complications: none.  Cord pH: not obtained. Cord blood collected.   Anesthesia: Epidural Episiotomy: None Lacerations:  None Suture Repair: None Est. Blood Loss (mL):  150  Mom to postpartum.  Baby to Couplet care / Skin to Skin.  Hildred Lasernika Nakaila Freeze 02/25/2017, 10:58 PM

## 2016-04-28 LAB — HM PAP SMEAR: HM Pap smear: NEGATIVE

## 2016-05-22 ENCOUNTER — Emergency Department: Payer: Managed Care, Other (non HMO)

## 2016-05-22 ENCOUNTER — Emergency Department
Admission: EM | Admit: 2016-05-22 | Discharge: 2016-05-22 | Disposition: A | Discharge status: Home / Self Care | Payer: Managed Care, Other (non HMO) | Attending: Emergency Medicine | Admitting: Emergency Medicine

## 2016-05-22 ENCOUNTER — Encounter: Payer: Self-pay | Admitting: Emergency Medicine

## 2016-05-22 DIAGNOSIS — Z79899 Other long term (current) drug therapy: Secondary | ICD-10-CM | POA: Diagnosis not present

## 2016-05-22 DIAGNOSIS — N9489 Other specified conditions associated with female genital organs and menstrual cycle: Secondary | ICD-10-CM | POA: Diagnosis not present

## 2016-05-22 DIAGNOSIS — J45909 Unspecified asthma, uncomplicated: Secondary | ICD-10-CM | POA: Insufficient documentation

## 2016-05-22 DIAGNOSIS — N939 Abnormal uterine and vaginal bleeding, unspecified: Secondary | ICD-10-CM

## 2016-05-22 DIAGNOSIS — Z3A01 Less than 8 weeks gestation of pregnancy: Secondary | ICD-10-CM | POA: Diagnosis not present

## 2016-05-22 DIAGNOSIS — O039 Complete or unspecified spontaneous abortion without complication: Secondary | ICD-10-CM | POA: Diagnosis not present

## 2016-05-22 DIAGNOSIS — O209 Hemorrhage in early pregnancy, unspecified: Secondary | ICD-10-CM | POA: Diagnosis present

## 2016-05-22 LAB — COMPREHENSIVE METABOLIC PANEL
ALBUMIN: 3.5 g/dL (ref 3.5–5.0)
ALT: 14 U/L (ref 14–54)
AST: 35 U/L (ref 15–41)
Alkaline Phosphatase: 58 U/L (ref 38–126)
Anion gap: 8 (ref 5–15)
BILIRUBIN TOTAL: 1.1 mg/dL (ref 0.3–1.2)
BUN: 7 mg/dL (ref 6–20)
CHLORIDE: 106 mmol/L (ref 101–111)
CO2: 24 mmol/L (ref 22–32)
Calcium: 8.4 mg/dL — ABNORMAL LOW (ref 8.9–10.3)
Creatinine, Ser: 0.66 mg/dL (ref 0.44–1.00)
GFR calc Af Amer: >60 mL/min (ref >60)
GLUCOSE: 91 mg/dL (ref 65–99)
POTASSIUM: 4.5 mmol/L (ref 3.5–5.1)
Sodium: 138 mmol/L (ref 135–145)
TOTAL PROTEIN: 7.2 g/dL (ref 6.5–8.1)

## 2016-05-22 LAB — CBC WITH DIFFERENTIAL/PLATELET
BASOS ABS: 0.1 10*3/uL (ref 0–0.1)
Basophils Relative: 1 %
Eosinophils Absolute: 0.2 10*3/uL (ref 0–0.7)
Eosinophils Relative: 1 %
HCT: 38 % (ref 35.0–47.0)
Hemoglobin: 12.7 g/dL (ref 12.0–16.0)
LYMPHS PCT: 26 %
Lymphs Abs: 4.2 10*3/uL — ABNORMAL HIGH (ref 1.0–3.6)
MCH: 29.1 pg (ref 26.0–34.0)
MCHC: 33.4 g/dL (ref 32.0–36.0)
MCV: 87 fL (ref 80.0–100.0)
Monocytes Absolute: 0.6 10*3/uL (ref 0.2–0.9)
Monocytes Relative: 4 %
NEUTROS ABS: 11 10*3/uL — AB (ref 1.4–6.5)
Neutrophils Relative %: 68 %
PLATELETS: 236 10*3/uL (ref 150–440)
RBC: 4.37 MIL/uL (ref 3.80–5.20)
RDW: 13.9 % (ref 11.5–14.5)
WBC: 16 10*3/uL — AB (ref 3.6–11.0)

## 2016-05-22 LAB — TYPE AND SCREEN
ABO/RH(D): O POS
Antibody Screen: NEGATIVE

## 2016-05-22 LAB — HCG, QUANTITATIVE, PREGNANCY: HCG, BETA CHAIN, QUANT, S: 2651 m[IU]/mL — AB (ref <5)

## 2016-05-22 MED ORDER — METHYLERGONOVINE MALEATE 0.2 MG PO TABS: 0.2000 mg | ORAL_TABLET | Freq: Four times a day (QID) | ORAL | 0 refills | Status: AC

## 2016-05-22 MED ORDER — FENTANYL CITRATE (PF) 100 MCG/2ML IJ SOLN
50.0000 ug | Freq: Once | INTRAMUSCULAR | Status: AC
  Administered 2016-05-22: 50 ug via INTRAVENOUS
  Filled 2016-05-22: qty 2

## 2016-05-22 MED ORDER — TRANEXAMIC ACID 1000 MG/10ML IV SOLN
1000.0000 mg | INTRAVENOUS | Status: AC
  Administered 2016-05-22: 1000 mg via INTRAVENOUS
  Filled 2016-05-22: qty 10

## 2016-05-22 MED ORDER — SODIUM CHLORIDE 0.9 % IV BOLUS (SEPSIS)
1000.0000 mL | Freq: Once | INTRAVENOUS | Status: AC
  Administered 2016-05-22: 1000 mL via INTRAVENOUS

## 2016-05-22 NOTE — Discharge Instructions (Signed)
If you have significant bleeding, or pain return to the emergency room. If you have lightheadedness return to the emergency room. You do have a cyst on her ovary which is likely benign but our radiologist requested you have a repeat ultrasound in 5-7 weeks to reassess.

## 2016-05-22 NOTE — ED Provider Notes (Signed)
Talked with patient, had her stand up and checked her blood pressure it was 108/71 with no increase her heart rate and no dizziness. She feels well. She reports her bleeding has all but stopped, she is anxious to go home and I feel this is reasonable as she agrees to return immediately if worsening bleeding   Alexa Everyobert Mcadoo Muzquiz, MD 05/22/16 1046

## 2016-05-22 NOTE — ED Provider Notes (Addendum)
[facility] Emergency Department Provider Note  ____________________________________________   I have reviewed the triage vital signs and the nursing notes.   HISTORY  Chief Complaint Vaginal Bleeding    HPI Alexa Hunter is a [age] y.o. female  who is G3 P2 is low weeks' pregnant, went to her primary be GYN was told that she had a fetal demise. She took 4 pills of Cytotec transvaginally this afternoon so to have significant bleeding. Patient has no history of a bleeding dyscrasia, she has no history of blood thinners or DVT or PE, no history of CAD. Patient states she's been having significant cramping, and has passed innumerable clots. She states that she has had passage of fetal tissue as well. She believes that she passed the entire fetus.       Past Medical History:  Diagnosis Date  . Abnormal Pap smear of cervix   . Asthma   . Contact dermatitis     Patient Active Problem List   Diagnosis Date Noted  . Adnexal cyst 11/24/2015  . History of depression 05/13/2015  . Plantar fasciitis, right 03/14/2014  . History of abnormal cervical Pap smear 05/21/2013  . Eczema 12/10/2010  . HYPERLIPIDEMIA 03/23/2010  . OBESITY 03/23/2010  . MIGRAINE HEADACHE 08/26/2008  . 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 RHINITIS, ALLERGIC NOS 03/17/2006  . ASTHMA 03/17/2006    Past Surgical History:  Procedure Laterality Date  . CHOLECYSTECTOMY  2013  . 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 none      Prior to Admission medications   Medication Sig Start Date End Date Taking? Authorizing Provider  Cholecalciferol (VITAMIN D3) 2000 units capsule Take by mouth. 10/13/15   Historical Provider, MD  Multiple Vitamins-Minerals (THERA-M) TABS Take by mouth. 10/13/15   Historical Provider, MD    Allergies Flagyl [metronidazole]  Family History  Problem Relation Age of Onset  . Migraines Mother   . Hypertension Mother   . Diabetes Maternal Grandmother   . Diabetes Sister      Social History Social History  Substance Use Topics  . Smoking status: Never Smoker  . Smokeless tobacco: Never Used  . Alcohol use No    Review of Systems Constitutional: No fever/chills Eyes: No visual changes. ENT: No sore throat. No stiff neck no neck pain Cardiovascular: Denies chest pain. Respiratory: Denies shortness of breath. Gastrointestinal:   no vomiting.  No diarrhea.  No constipation. Genitourinary: Negative for dysuria. Musculoskeletal: Negative lower extremity swelling Skin: Negative for rash. Neurological: Negative for severe headaches, focal weakness or numbness. 10-point ROS otherwise negative.  ____________________________________________   PHYSICAL EXAM:  VITAL SIGNS: ED Triage Vitals  Enc Vitals Group     BP 05/22/16 0404 (!) 86/59     Pulse Rate 05/22/16 0404 60     Resp 05/22/16 0404 (!) 8     Temp 05/22/16 0409 97.9 F (36.6 C)     Temp Source 05/22/16 0409 Oral     SpO2 05/22/16 0404 100 %     Weight 05/22/16 0409 202 lb (91.6 kg)     Height 05/22/16 0409 5\' 4"  (1.626 m)     Head Circumference --      Peak Flow --      Pain Score 05/22/16 0409 9     Pain Loc --      Pain Edu? --      Excl. in GC? --     Constitutional: Alert and oriented. Well appearing and in no acute distress.  Eyes: Conjunctivae are normal. PERRL. EOMI. Head: Atraumatic. Nose: No congestion/rhinnorhea. Mouth/Throat: Mucous membranes are moist.  Oropharynx non-erythematous. Neck: No stridor.   Nontender with no meningismus Cardiovascular: Normal rate, regular rhythm. Grossly normal heart sounds.  Good peripheral circulation. Respiratory: Normal respiratory effort.  No retractions. Lungs CTAB. Abdominal: Soft and nontender. No distention. No guarding no rebound Back:  There is no focal tenderness or step off.  there is no midline tenderness there are no lesions noted. there is no CVA tenderness Pelvic exam: Female nurse chaperone present, no external lesions  noted, physiologic vaginal discharge noted with no purulent discharge, no cervical motion tenderness, no adnexal tenderness or mass, is minimal uterine tenderness, there is a large blood clot in the vault. No fetal tissue noted. Musculoskeletal: No lower extremity tenderness, no upper extremity tenderness. No joint effusions, no DVT signs strong distal pulses no edema Neurologic:  Normal speech and language. No gross focal neurologic deficits are appreciated.  Skin:  Skin is warm, dry and intact. No rash noted. Psychiatric: Mood and affect are normal. Speech and behavior are normal.  ____________________________________________   LABS (all labs ordered are listed, but only abnormal results are displayed)  Labs Reviewed  COMPREHENSIVE METABOLIC PANEL - Abnormal; Notable for the following:       Result Value   Calcium 8.4 (*)    All other components within normal limits  CBC WITH DIFFERENTIAL/PLATELET - Abnormal; Notable for the following:    WBC 16.0 (*)    Neutro Abs 11.0 (*)    Lymphs Abs 4.2 (*)    All other components within normal limits  HCG, QUANTITATIVE, PREGNANCY  PROTIME-INR  TYPE AND SCREEN   ____________________________________________  EKG  I personally interpreted any EKGs ordered by me or triage  ____________________________________________  RADIOLOGY  I reviewed any imaging ordered by me or triage that were performed during my shift and, if possible, patient and/or family made aware of any abnormal findings. ____________________________________________   PROCEDURES  Procedure(s) performed: None  Procedures  Critical Care performed: None  ____________________________________________   INITIAL IMPRESSION / ASSESSMENT AND PLAN / ED COURSE  Pertinent labs & imaging results that were available during my care of the patient were reviewed by me and considered in my medical decision making (see chart for details).  Patient with significant vaginal  bleeding lightheadedness initial pressure was somewhat low. We're giving her IV fluid and she was responding. I talked to Dr. Toma Copier of OB/GYN who is kind enough to give a consult. She recommended that I give the patient 1 g IV lysteda, patient has no known Drug medications to that. We will do so. She also advised getting an ultrasound and evaluating the amount of clot. We did discuss the patient's vital signs, her blood work and her exam etc.    ----------------------------------------- 5:44 AM on 05/22/2016 -----------------------------------------  H and feels like her bleeding is stopped after TXA  ----------------------------------------- 7:30 AM on 05/22/2016 -----------------------------------------  She has no ongoing bleeding at this time, no clots. Ultrasound results reviewed with patient. She understands she must repeat ultrasound done as an outpatient to further evaluate her rate, although a subcentimeter lesion is not likely to be of great significance. The patient also is advised of return precautions including bleeding or lightheadedness. Blood pressure slightly low here but unclear what the patient's baseline is that she is responding well to fluids. She is not tachycardic and she feels much better. Did discuss with Dr. Dalbert Garnet who agrees with this plan. She  would like methergin to be taken at home. Hemoglobin is reassuring and she is Rh+   ----------------------------------------- 7:46 AM on 05/22/2016 -----------------------------------------  Dr. Cyril Loosen will follow-up on patient. She was mildly orthostatic we will give more IV fluid in concordance with the OB/GYN plan and if she is that her after that given that she has no ongoing bleeding at this time we will discharge her with extensive return precautions. ____________________________________________   FINAL CLINICAL IMPRESSION(S) / ED DIAGNOSES  Final diagnoses:  None      This chart was dictated using voice  recognition software.  Despite best efforts to proofread,  errors can occur which can change meaning.      Jeanmarie Plant, MD 05/22/16 1610    Jeanmarie Plant, MD 05/22/16 9604    Jeanmarie Plant, MD 05/22/16 5409    Jeanmarie Plant, MD 05/22/16 [id number]

## 2016-05-22 NOTE — ED Triage Notes (Signed)
Patient brought in by ems from home. Patient was [redacted] weeks pregnant and on Thursday the patients OB told her the baby did not have a heart beat. Patient was given methotrexate Saturday. Patient reports that she delivered that baby at 16:30 yesterday afternoon. Patient states she has been saturating a thick pad about every 45 minutes since yesterday at 15:30. Patient states that she had a syncopal episode this morning.

## 2016-05-22 NOTE — ED Notes (Signed)
Returned to room via stretcher. 

## 2016-05-22 NOTE — ED Notes (Signed)
Patient transported to Ultrasound 

## 2016-08-10 DIAGNOSIS — Z8751 Personal history of pre-term labor: Secondary | ICD-10-CM | POA: Insufficient documentation

## 2016-08-10 DIAGNOSIS — Z8759 Personal history of other complications of pregnancy, childbirth and the puerperium: Secondary | ICD-10-CM | POA: Insufficient documentation

## 2016-08-10 LAB — OB RESULTS CONSOLE HEPATITIS B SURFACE ANTIGEN: Hepatitis B Surface Ag: NEGATIVE

## 2016-08-10 LAB — OB RESULTS CONSOLE RUBELLA ANTIBODY, IGM: Rubella: IMMUNE

## 2016-08-10 LAB — OB RESULTS CONSOLE HIV ANTIBODY (ROUTINE TESTING): HIV: NONREACTIVE

## 2016-08-10 LAB — OB RESULTS CONSOLE RPR: RPR: NONREACTIVE

## 2016-09-22 DIAGNOSIS — K912 Postsurgical malabsorption, not elsewhere classified: Secondary | ICD-10-CM | POA: Insufficient documentation

## 2016-09-22 DIAGNOSIS — Z903 Acquired absence of stomach [part of]: Secondary | ICD-10-CM | POA: Insufficient documentation

## 2016-12-12 DIAGNOSIS — O99810 Abnormal glucose complicating pregnancy: Secondary | ICD-10-CM | POA: Insufficient documentation

## 2016-12-29 ENCOUNTER — Observation Stay
Admission: EM | Admit: 2016-12-29 | Discharge: 2016-12-29 | Disposition: A | Discharge status: Home / Self Care | Payer: Managed Care, Other (non HMO) | Attending: Obstetrics and Gynecology | Admitting: Obstetrics and Gynecology

## 2016-12-29 DIAGNOSIS — O26893 Other specified pregnancy related conditions, third trimester: Secondary | ICD-10-CM | POA: Diagnosis not present

## 2016-12-29 DIAGNOSIS — Z3A27 27 weeks gestation of pregnancy: Secondary | ICD-10-CM

## 2016-12-29 DIAGNOSIS — O4702 False labor before 37 completed weeks of gestation, second trimester: Secondary | ICD-10-CM | POA: Diagnosis present

## 2016-12-29 DIAGNOSIS — O26899 Other specified pregnancy related conditions, unspecified trimester: Secondary | ICD-10-CM | POA: Diagnosis present

## 2016-12-29 DIAGNOSIS — R109 Unspecified abdominal pain: Secondary | ICD-10-CM | POA: Diagnosis not present

## 2016-12-29 LAB — URINALYSIS, ROUTINE W REFLEX MICROSCOPIC
Bilirubin Urine: NEGATIVE
Glucose, UA: NEGATIVE mg/dL
HGB URINE DIPSTICK: NEGATIVE
KETONES UR: NEGATIVE mg/dL
Leukocytes, UA: NEGATIVE
Nitrite: NEGATIVE
PROTEIN: NEGATIVE mg/dL
Specific Gravity, Urine: 1.009 (ref 1.005–1.030)
pH: 7 (ref 5.0–8.0)

## 2016-12-29 MED ORDER — LACTATED RINGERS IV BOLUS (SEPSIS)
1000.0000 mL | Freq: Once | INTRAVENOUS | Status: AC
  Administered 2016-12-29: 1000 mL via INTRAVENOUS

## 2016-12-29 MED ORDER — ACETAMINOPHEN 325 MG PO TABS: 650.0000 mg | ORAL_TABLET | Freq: Four times a day (QID) | ORAL | Status: DC | PRN

## 2016-12-29 NOTE — Final Progress Note (Signed)
L&D OB Triage Note  Alexa LambertDanielle Laughlin Hunter is an unassigned 31 y.o. [id number] female at 146w0d, EDD Estimated Date of Delivery: 03/30/17 who presented to triage for complaints of crampy pain and possible contractions.  [facility]. She was seen today by her OB physician who noted she was likely dehydrated. Noted that she tried to go home and PO hydrate, but due to her h/o gastric bypass, she is unable to take in large volumes.  She was evaluated by the nurses with no significant findings/findings for preterm labor.  She was noted to have uterine irritability with occasional contractions. Vital signs stable. An NST was performed and has been reviewed by MD. She was treated with IVF bolus.    Physical Exam:  Blood pressure 114/68, pulse 83, temperature 98.7 F (37.1 C), temperature source Oral, resp. rate 18, height 5\' 4"  (1.626 m), weight 217 lb (98.4 kg), last menstrual period 01/24/2016.  Cervical exam deferred.    NST INTERPRETATION: Indications: rule out uterine contractions  Mode: External Baseline Rate (A): 145 bpm Variability: Moderate Accelerations: 15 x 15 Decelerations: None     Contraction Frequency (min): irregular UI  Impression: reactive   Labs:  Results for orders placed or performed during the hospital encounter of 12/29/16  Urinalysis, Routine w reflex microscopic  Result Value Ref Range   Color, Urine STRAW (A) YELLOW   APPearance CLEAR (A) CLEAR   Specific Gravity, Urine 1.009 1.005 - 1.030   pH 7.0 5.0 - 8.0   Glucose, UA NEGATIVE NEGATIVE mg/dL   Hgb urine dipstick NEGATIVE NEGATIVE   Bilirubin Urine NEGATIVE NEGATIVE   Ketones, ur NEGATIVE NEGATIVE mg/dL   Protein, ur NEGATIVE NEGATIVE mg/dL   Nitrite NEGATIVE NEGATIVE   Leukocytes, UA NEGATIVE NEGATIVE    Plan: NST performed was reviewed and was found to be reactive. She was discharged home with bleeding/preterm labor precautions. Advised on Tylenol prn.  Continue  routine prenatal care. Follow up with OB/GYN as previously scheduled.     [name], MD [facility]

## 2016-12-29 NOTE — Discharge Instructions (Signed)

## 2016-12-30 DIAGNOSIS — O4702 False labor before 37 completed weeks of gestation, second trimester: Secondary | ICD-10-CM | POA: Diagnosis not present

## 2017-02-17 LAB — OB RESULTS CONSOLE HIV ANTIBODY (ROUTINE TESTING): HIV: NONREACTIVE

## 2017-02-20 ENCOUNTER — Inpatient Hospital Stay
Admission: EM | Admit: 2017-02-20 | Discharge: 2017-02-23 | DRG: 833 | Disposition: A | Discharge status: Home / Self Care | Payer: Managed Care, Other (non HMO) | Attending: Obstetrics and Gynecology | Admitting: Obstetrics and Gynecology

## 2017-02-20 ENCOUNTER — Inpatient Hospital Stay: Payer: Managed Care, Other (non HMO)

## 2017-02-20 DIAGNOSIS — O365930 Maternal care for other known or suspected poor fetal growth, third trimester, not applicable or unspecified: Secondary | ICD-10-CM | POA: Diagnosis present

## 2017-02-20 DIAGNOSIS — O09213 Supervision of pregnancy with history of pre-term labor, third trimester: Secondary | ICD-10-CM | POA: Diagnosis not present

## 2017-02-20 DIAGNOSIS — O99213 Obesity complicating pregnancy, third trimester: Secondary | ICD-10-CM | POA: Diagnosis present

## 2017-02-20 DIAGNOSIS — O42919 Preterm premature rupture of membranes, unspecified as to length of time between rupture and onset of labor, unspecified trimester: Secondary | ICD-10-CM

## 2017-02-20 DIAGNOSIS — O99843 Bariatric surgery status complicating pregnancy, third trimester: Secondary | ICD-10-CM | POA: Diagnosis present

## 2017-02-20 DIAGNOSIS — O09893 Supervision of other high risk pregnancies, third trimester: Secondary | ICD-10-CM

## 2017-02-20 DIAGNOSIS — O42913 Preterm premature rupture of membranes, unspecified as to length of time between rupture and onset of labor, third trimester: Secondary | ICD-10-CM | POA: Diagnosis present

## 2017-02-20 DIAGNOSIS — E669 Obesity, unspecified: Secondary | ICD-10-CM | POA: Diagnosis present

## 2017-02-20 DIAGNOSIS — O365990 Maternal care for other known or suspected poor fetal growth, unspecified trimester, not applicable or unspecified: Secondary | ICD-10-CM | POA: Diagnosis not present

## 2017-02-20 DIAGNOSIS — Z3A34 34 weeks gestation of pregnancy: Secondary | ICD-10-CM

## 2017-02-20 DIAGNOSIS — Z8759 Personal history of other complications of pregnancy, childbirth and the puerperium: Secondary | ICD-10-CM

## 2017-02-20 HISTORY — DX: Supervision of other high risk pregnancies, third trimester: O09.893

## 2017-02-20 LAB — URINALYSIS, ROUTINE W REFLEX MICROSCOPIC
Bacteria, UA: NONE SEEN
Bilirubin Urine: NEGATIVE
GLUCOSE, UA: NEGATIVE mg/dL
HGB URINE DIPSTICK: NEGATIVE
Ketones, ur: NEGATIVE mg/dL
Nitrite: NEGATIVE
PH: 7 (ref 5.0–8.0)
PROTEIN: NEGATIVE mg/dL
Specific Gravity, Urine: 1.005 (ref 1.005–1.030)

## 2017-02-20 LAB — CBC
HCT: 37 % (ref 35.0–47.0)
Hemoglobin: 12.1 g/dL (ref 12.0–16.0)
MCH: 27.4 pg (ref 26.0–34.0)
MCHC: 32.7 g/dL (ref 32.0–36.0)
MCV: 83.9 fL (ref 80.0–100.0)
PLATELETS: 268 10*3/uL (ref 150–440)
RBC: 4.41 MIL/uL (ref 3.80–5.20)
RDW: 13.6 % (ref 11.5–14.5)
WBC: 11 10*3/uL (ref 3.6–11.0)

## 2017-02-20 LAB — CHLAMYDIA/NGC RT PCR (ARMC ONLY)
Chlamydia Tr: NOT DETECTED
N GONORRHOEAE: NOT DETECTED

## 2017-02-20 LAB — TYPE AND SCREEN
ABO/RH(D): O POS
ANTIBODY SCREEN: NEGATIVE

## 2017-02-20 MED ORDER — AZITHROMYCIN 500 MG PO TABS
1000.0000 mg | ORAL_TABLET | Freq: Once | ORAL | Status: AC
  Administered 2017-02-20: 1000 mg via ORAL
  Filled 2017-02-20: qty 2

## 2017-02-20 MED ORDER — DOCUSATE SODIUM 100 MG PO CAPS
100.0000 mg | ORAL_CAPSULE | Freq: Every day | ORAL | Status: DC
  Administered 2017-02-21 – 2017-02-23 (×3): 100 mg via ORAL
  Filled 2017-02-20 (×4): qty 1

## 2017-02-20 MED ORDER — CALCIUM CARBONATE ANTACID 500 MG PO CHEW: 2.0000 | CHEWABLE_TABLET | ORAL | Status: DC | PRN

## 2017-02-20 MED ORDER — ZOLPIDEM TARTRATE 5 MG PO TABS
5.0000 mg | ORAL_TABLET | Freq: Every evening | ORAL | Status: DC | PRN
  Administered 2017-02-20 – 2017-02-21 (×2): 5 mg via ORAL
  Filled 2017-02-20 (×2): qty 1

## 2017-02-20 MED ORDER — PRENATAL MULTIVITAMIN CH
1.0000 | ORAL_TABLET | Freq: Every day | ORAL | Status: DC
  Administered 2017-02-21 – 2017-02-23 (×3): 1 via ORAL
  Filled 2017-02-20 (×4): qty 1

## 2017-02-20 MED ORDER — SODIUM CHLORIDE 0.9 % IV SOLN
2.0000 g | Freq: Four times a day (QID) | INTRAVENOUS | Status: AC
  Administered 2017-02-20 – 2017-02-22 (×8): 2 g via INTRAVENOUS
  Filled 2017-02-20 (×8): qty 2000

## 2017-02-20 MED ORDER — LACTATED RINGERS IV SOLN
INTRAVENOUS | Status: DC
  Administered 2017-02-20: 21:00:00 via INTRAVENOUS
  Administered 2017-02-20: 1000 mL via INTRAVENOUS
  Administered 2017-02-21 – 2017-02-23 (×4): via INTRAVENOUS

## 2017-02-20 MED ORDER — BETAMETHASONE SOD PHOS & ACET 6 (3-3) MG/ML IJ SUSP
12.0000 mg | INTRAMUSCULAR | Status: AC
  Administered 2017-02-20 – 2017-02-21 (×2): 12 mg via INTRAMUSCULAR
  Filled 2017-02-20: qty 2

## 2017-02-20 MED ORDER — ACETAMINOPHEN 325 MG PO TABS
650.0000 mg | ORAL_TABLET | ORAL | Status: DC | PRN
  Administered 2017-02-22: 650 mg via ORAL
  Filled 2017-02-20: qty 2

## 2017-02-20 MED ORDER — BETAMETHASONE SOD PHOS & ACET 6 (3-3) MG/ML IJ SUSP
INTRAMUSCULAR | Status: AC
  Filled 2017-02-20: qty 1

## 2017-02-20 NOTE — OB Triage Note (Signed)
Patient transferred from 338 to Obs 1 via wheelchair for NST. Pt reports good fetal movement and denies contractions at this time. Discussed plan of care. Pt verbalized understanding and agreed to plan.

## 2017-02-20 NOTE — OB Triage Note (Signed)
Pt presents c/o LOF since 1:00 this morning that continues to trickle out. (Nitrazine positive). Denies feeling any ctx or bleeding. Reports positive fetal movement. No recent intercourse. Vitals WNL. Will continue to monitor.

## 2017-02-20 NOTE — H&P (Signed)
Obstetric History and Physical  Alexa Hunter is an unassigned 31 y.o. [phone] with IUP at [redacted]w[redacted]d presenting for complaints of ruptured membranes since 0100 this morning.  Patient states that she has been feeling an intermittent trickle since that time. Initially denied contractions at arrival, however now noting contractions ~ q 10 minutes.  Denies vaginal bleeding and notes active fetal movement.  Nitrazine test was positive. Denies recent coitus.   Of note, patient receives [facility]), beginning at [redacted] weeks gestation.  She has a h/o preterm delivery x 1 (also at ~ 34 weeks).  Was on [facility] this pregnancy.  Of note, patient also with h/o gastric bypass.    Prenatal Course Pregnancy complications or risks: Patient Active Problem List   Diagnosis Date Noted  . Labor and delivery, indication for care 02/20/2017  . Cramping affecting pregnancy, antepartum 12/29/2016  . Adnexal cyst 11/24/2015  . History of depression 05/13/2015  . Plantar fasciitis, right 03/14/2014  . History of abnormal cervical Pap smear 05/21/2013  . Eczema 12/10/2010  . HYPERLIPIDEMIA 03/23/2010  . OBESITY 03/23/2010  . MIGRAINE HEADACHE 08/26/2008  . 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 RHINITIS, ALLERGIC NOS 03/17/2006  . ASTHMA 03/17/2006   She plans to breastfeed She desires oral contraceptives (estrogen/progesterone) for postpartum contraception.   Prenatal labs and studies (Reviewed in Care Everywhere): ABO, Rh: --/--/O POS (01/28 0424) Antibody: NEG (01/28 0424) Rubella:  Immune (08/10/2016) RPR:   Negative (08/10/2016) HBsAg:   Negative (08/10/2016) HIV:  Non-reactive (08/10/2016) GBS: Unknown 1 hr Glucola  (not performed due to h/o bariatric surgery). Patient did not complete alternative screening.  Genetic screening declined Anatomy US normal   Past Medical History:  Diagnosis Date  . Abnormal Pap smear of cervix   . Asthma   . Contact dermatitis     Past  Surgical History:  Procedure Laterality Date  . CHOLECYSTECTOMY  2013  . GASTRIC BYPASS  09/28/2015    OB History  Gravida Para Term Preterm AB Living  4 1   1 2 1   SAB TAB Ectopic Multiple Live Births  2            # Outcome Date GA Lbr Len/2nd Weight Sex Delivery Anes PTL Lv  4 Current           3 SAB           2 SAB           1 Preterm               Social History   Social History  . Marital status: Married    Spouse name: N/A  . Number of children: N/A  . Years of education: N/A   Occupational History  . [employer]    Social History Main Topics  . Smoking status: Never Smoker  . Smokeless tobacco: Never Used  . Alcohol use No  . Drug use: No  . Sexual activity: Yes    Birth control/ protection: Pill, Condom   Other Topics Concern  . None   Social History Narrative   Has a son.      Family History  Problem Relation Age of Onset  . Migraines Mother   . Hypertension Mother   . Diabetes Maternal Grandmother   . Diabetes Sister     Prescriptions Prior to Admission  Medication Sig Dispense Refill Last Dose  . Cholecalciferol (VITAMIN D3) 2000 units capsule Take  by mouth.   02/19/2017 at Unknown time  . Multiple Vitamins-Minerals (THERA-M) TABS Take by mouth.   02/19/2017 at Unknown time    Allergies  Allergen Reactions  . Flagyl [Metronidazole] Nausea And Vomiting    Oral flagyl only    Review of Systems: Negative except for what is mentioned in HPI.  Physical Exam: BP 101/68 (BP Location: Right Arm)   Pulse (!) 56   Temp 97.6 F (36.4 C) (Oral)   Resp 18   Ht 5\' 4"  (1.626 m)   Wt 230 lb (104.3 kg)   LMP 01/24/2016 (LMP Unknown)   SpO2 100%   BMI 39.48 kg/m  CONSTITUTIONAL: Well-developed, well-nourished female in no acute distress.  HENT:  Normocephalic, atraumatic, External right and left ear normal. Oropharynx is clear and moist EYES: Conjunctivae and EOM are normal. Pupils are equal, round, and  reactive to light. No scleral icterus.  NECK: Normal range of motion, supple, no masses SKIN: Skin is warm and dry. No rash noted. Not diaphoretic. No erythema. No pallor. NEUROLOGIC: Alert and oriented to person, place, and time. Normal reflexes, muscle tone coordination. No cranial nerve deficit noted. PSYCHIATRIC: Normal mood and affect. Normal behavior. Normal judgment and thought content. CARDIOVASCULAR: Normal heart rate noted, regular rhythm RESPIRATORY: Effort and breath sounds normal, no problems with respiration noted ABDOMEN: Soft, nontender, nondistended, gravid. MUSCULOSKELETAL: Normal range of motion. No edema and no tenderness. 2+ distal pulses.  Cervical Exam: Dilatation 1 cm   Effacement thick%.  Based on speculum exam. Moderate white vaginal discharge present. No fluid noted on Valsalva.  Digital exam not performed.  Presentation: cephalic (based on ultrasound) FHT:  Baseline rate 150 bpm   Variability moderate  Accelerations present   Decelerations none Contractions: Every occasional mins   Pertinent Labs/Studies:   Results for orders placed or performed during the hospital encounter of 02/20/17 (from the past 24 hour(s))  Urinalysis, Routine w reflex microscopic     Status: Abnormal   Collection Time: 02/20/17  9:50 AM  Result Value Ref Range   Color, Urine YELLOW (A) YELLOW   APPearance HAZY (A) CLEAR   Specific Gravity, Urine 1.005 1.005 - 1.030   pH 7.0 5.0 - 8.0   Glucose, UA NEGATIVE NEGATIVE mg/dL   Hgb urine dipstick NEGATIVE NEGATIVE   Bilirubin Urine NEGATIVE NEGATIVE   Ketones, ur NEGATIVE NEGATIVE mg/dL   Protein, ur NEGATIVE NEGATIVE mg/dL   Nitrite NEGATIVE NEGATIVE   Leukocytes, UA TRACE (A) NEGATIVE   RBC / HPF 0-5 0 - 5 RBC/hpf   WBC, UA 0-5 0 - 5 WBC/hpf   Bacteria, UA NONE SEEN NONE SEEN   Squamous Epithelial / LPF 6-30 (A) NONE SEEN    Imaging:  CLINICAL DATA:  31 year old pregnant female presents with a history of preterm premature  rupture of membranes.  Assigned EDC: 03/30/2017, projecting to an expected gestational age of [redacted] weeks 4 days.  EXAM: OBSTETRICAL ULTRASOUND >14 WKS  FINDINGS: Number of Fetuses: 1  Heart Rate:  141 bpm  Movement: Yes  Presentation: Cephalic  Previa: No  Placental Location: Anterior  Amniotic Fluid (Subjective): Normal  Amniotic Fluid (Objective):  Vertical pocket 4.2cm  AFI 10.9 cm (5%ile= 8.1 cm, 95%= 24.8 cm for 34 wks)  FETAL BIOMETRY  BPD:  8.3cm 33w 2d  HC:    29.2cm  32w   1d  AC:   20.5cm  32w   3d  FL:   6.6cm  33w   5d  Current Mean  GA: 33w 1d              US EDC: 04/09/2017  Estimated Fetal Weight:  2,074g    9%ile  FETAL ANATOMY  Lateral Ventricles: Appears normal  Thalami/CSP: Appears normal  Posterior Fossa:  Limited views appear normal  Upper Lip: Appears normal  Spine: Not well visualized  4 Chamber Heart on Left: Appears normal  LVOT: Appears normal  RVOT: Not visualized  Stomach on Left: Appears normal  3 Vessel Cord: Appears normal  Cord Insertion site: Not visualized  Kidneys: Appears normal  Bladder: Appears normal  Extremities: Not well visualized  Sex: Not visualized.  Technically difficult due to: Advanced gestational age.  Maternal Findings:  Cervix: Not visualized on this transabdominal scan due to advanced fetal age with cephalic fetal positioning.  IMPRESSION: 1. Single living intrauterine gestation in cephalic lie at 33 weeks 1 day by average ultrasound age, compared to the expected gestational age of [redacted] weeks 4 days. 2. Estimated fetal weight 2074 g, at the 9th percentile. Close clinical monitoring advised. Fetal umbilical Doppler and/or follow-up obstetric scan in 4 weeks can be obtained as clinically warranted. 3. Normal amniotic fluid volume.  AFI 10.9. 4. Anterior placenta with no previa. 5. Limited fetal anatomic survey due to advanced gestational age,  as described, with no abnormality detected.  Assessment : Alexa Hunter is a 31 y.o. [phone] at 8139w4d being admitted for observation of questionable PPROM with h/o prior preterm delivery, obesity s/p gastric bypass surgery.  Ultrasound today also noting growth restriction.   Plan: Labor:  - Expectant management.  Nitrazine test positive but no evidence of fluid leakage on speculum exam. If labor ensues, will manage for delivery. If no labor, will continue to manage expectantly. Discussed with [facility] (curbside consult with Dr. Kirby FunkSarah Ellestad), who recommends monitoring for 28-48 hours for contractions, signs of labor, allow for complete dose of antenatal steroids, and if no change in status, patient could potentially be managed outpatient.  Would also recommend repeat ultrasound.  - AFI currently 10.9, will repeat in 48 hours. If decreased, will continue to manage and potentially deliver. If stable, can manage outpatient.  - Initially desired to [facility] if delivery was to occur, but has now reconsidered and remain at [facility].  - Initiated on antibiotics for latency - Given 1st dose of betamethasone. Will attempt to given 2nd dose in 24 hours.  - Neonatology consult.  - Admission labs ordered.  - If no signs of labor, patient can be further monitored on antepartum service.   FWB:  - Reassuring fetal heart tracing.  GBS unknown.  Started on ampicillin for GBS unknown status.  - Growth restriction noted on recent ultrasound (9th%ile).  Last ultrasound performed by [facility] at [redacted] weeks gestation was at the 49th percentile (1081 grams) on 01/02/17.   Will repeat ultrasound in 48 hours for rechecking AFI, will also do BPP and Dopplers at that visit.     Hildred Laserherry, Tyjon Bowen, MD [facility]

## 2017-02-20 NOTE — OB Triage Note (Signed)
Reactive NST noted. Patient transported back to 338 via wheelchair in stable condition.

## 2017-02-21 LAB — RPR: RPR Ser Ql: NONREACTIVE

## 2017-02-21 NOTE — Progress Notes (Addendum)
  Antenatal Progress Note  Subjective:      HPI:   Alexa Hunter is a 31 y.o. [phone] female at 7948w5d, Estimated Date of Delivery: 03/30/17 by 1st trimester ultrasound,  who was admitted for possible PPROM and fetal growth restriction.  HD# 2.   Subjective:  Patient denies complaints today.  Tolerating diet.    Review of Systems Denies contractions, leakage of fluids, vaginal bleeding, and reports good fetal movement.     Objective:   Vitals:   02/20/17 1955 02/20/17 2345 02/21/17 0358 02/21/17 0754  BP: 120/61 (!) 107/52 (!) 106/55 (!) 105/47  Pulse: (!) 59 66 (!) 57 (!) 58  Resp: 18 18 18 18   Temp: 98.4 F (36.9 C) 98 F (36.7 C) 98 F (36.7 C) 98 F (36.7 C)  TempSrc: Oral Oral Oral Oral  SpO2: 100% 95% 99% 100%  Weight:      Height:        General appearance: alert and no distress Lungs: clear to auscultation bilaterally Heart: regular rate and rhythm, S1, S2 normal, no murmur, click, rub or gallop Abdomen: soft, non-tender; bowel sounds normal; no masses,  no organomegaly. Gravid.  Pelvic: deferred Extremities: extremities normal, atraumatic, no cyanosis or edema   FHT: pending NST this a.m.  Last shift NST reviewed, reactive.   Fetal Heart Rate A Mode: External Baseline Rate (A): 135 bpm Variability: Moderate Accelerations: 15 x 15 Decelerations: None  Uterine Activity Mode: Toco Contraction Frequency (min): none noted Contraction Duration (sec): 0 Contraction Quality: Mild Resting Tone Palpated: Relaxed Resting Time: Adequate    Assessment:  31 y.o. [id number] female at 5048w5d, Estimated Date of Delivery: 03/30/17 with:    1. Preterm premature rupture of membranes   2. Fetal growth restriction    Plan:   1. Suspected PPROM - nitrazine test positive yesterday, however no fluid noted on vaginal exam. Will perform ROM Plus after at least 24 hours (could not perform yesterday due to already having a vaginal exam).  AFI 10.9 cm on last  ultrasound, will repeat in 1-2 days to reassess AFI.  If AFI stable, can consider d/c home. Waiting for 2nd dose of steroids this morning.  2. Fetal growth restriction, 9%ile for growth on last limited ultrasound.  Will order Dopplers and BPP with next ultrasound.  3. Continue antibiotics for latency.  4. Continue to monitor for s/s of labor or chorioamnionitis.    Hildred Laserherry, Emir Nack, MD [facility]

## 2017-02-21 NOTE — Progress Notes (Signed)
Patient transferred to OBS 2 at 1935 for shift NST. Patient denies pain. Repots no vaginal bleeding and positive fetal movement. Patient reports scant amount LOF.

## 2017-02-21 NOTE — OB Triage Note (Signed)
Reactive category I NST. Patient transfered back to room 338 via wheelchair in stable condition.

## 2017-02-21 NOTE — OB Triage Note (Signed)
NST

## 2017-02-22 DIAGNOSIS — O09213 Supervision of pregnancy with history of pre-term labor, third trimester: Secondary | ICD-10-CM

## 2017-02-22 DIAGNOSIS — O99843 Bariatric surgery status complicating pregnancy, third trimester: Secondary | ICD-10-CM

## 2017-02-22 DIAGNOSIS — O365990 Maternal care for other known or suspected poor fetal growth, unspecified trimester, not applicable or unspecified: Secondary | ICD-10-CM | POA: Diagnosis present

## 2017-02-22 DIAGNOSIS — Z3A34 34 weeks gestation of pregnancy: Secondary | ICD-10-CM

## 2017-02-22 DIAGNOSIS — O42913 Preterm premature rupture of membranes, unspecified as to length of time between rupture and onset of labor, third trimester: Principal | ICD-10-CM

## 2017-02-22 LAB — WET PREP, GENITAL
CLUE CELLS WET PREP: NONE SEEN
Sperm: NONE SEEN
TRICH WET PREP: NONE SEEN
Yeast Wet Prep HPF POC: NONE SEEN

## 2017-02-22 LAB — CULTURE, BETA STREP (GROUP B ONLY)

## 2017-02-22 LAB — ROM PLUS (ARMC ONLY): ROM PLUS: NEGATIVE

## 2017-02-22 LAB — OB RESULTS CONSOLE GBS: STREP GROUP B AG: NEGATIVE

## 2017-02-22 MED ORDER — LIDOCAINE HCL (PF) 1 % IJ SOLN
INTRAMUSCULAR | Status: AC
  Filled 2017-02-22: qty 30

## 2017-02-22 NOTE — OB Triage Note (Signed)
NST

## 2017-02-22 NOTE — Progress Notes (Signed)
Nurse went to patient's room for NST, however patient refused at this time stating "I just now have a chance to get some sleep, can you come back later." I asked her nurse Eber JonesCarolyn to let me know when patient consents to NST and will return.

## 2017-02-22 NOTE — Progress Notes (Signed)
Pt rode in wheelchair x2 around nurses station.  Pt's mom came to nurse and states "her back is hurting worse".  BP notified and pt taken to OBS 2 for external monitoring.

## 2017-02-22 NOTE — OB Triage Note (Signed)
Pt c/o back pain that is intermittent and it is a tightening feeling. 6/10 on pain scale.

## 2017-02-22 NOTE — Progress Notes (Signed)
Pt ask to "walk around" " back hurting from laying in bed".  Encouraged to ride in wheelchair around the unit, unable to walk around d/t presenting symptoms.  Pt in wheelchair mom pushing her around nurses station.

## 2017-02-22 NOTE — Progress Notes (Addendum)
  Antenatal Progress Note  Subjective:     HPI:   Alexa Hunter is a 31 y.o. female at 5628w6d, Estimated Date of Delivery: 03/30/17 by 1st trimester ultrasound,  who was admitted for possible PPROM and fetal growth restriction.  HD# 3.   Subjective:  Patient notes feeling a little down.  Notes being in the hospital is making her a little "stir crazy".  Tolerating diet.    Review of Systems Denies contractions, vaginal bleeding, and reports good fetal movement. Does note occasional "trickles", but no gushing.     Objective:   Vitals:   02/21/17 1645 02/21/17 1940 02/22/17 0405 02/22/17 0807  BP: 104/66 118/65 (!) 97/56 104/61  Pulse: 60 (!) 58 (!) 46 60  Resp: 18 16 16 18   Temp: 97.8 F (36.6 C) 98.1 F (36.7 C) 98.2 F (36.8 C) 97.9 F (36.6 C)  TempSrc: Oral Oral Oral Oral  SpO2:   100% 99%  Weight:      Height:        General appearance: alert and no distress Lungs: clear to auscultation bilaterally Heart: regular rate and rhythm, S1, S2 normal, no murmur, click, rub or gallop Abdomen: soft, non-tender; bowel sounds normal; no masses,  no organomegaly. Gravid.  Pelvic: deferred Extremities: extremities normal, atraumatic, no cyanosis or edema   FHT: pending NST this a.m.  Last shift NST reviewed, reactive.   Fetal Heart Rate A Mode: External Baseline Rate (A): 145 bpm Variability: Moderate Accelerations: 15 x 15 Decelerations: None  Uterine Activity Mode: Toco Contraction Frequency (min): none noted Contraction Duration (sec): 50 Contraction Quality: Mild Resting Tone Palpated: Relaxed Resting Time: Adequate   Assessment:  31 y.o. [id number] female 2628w6d, Estimated Date of Delivery: 03/30/17 with:    1. Preterm premature rupture of membranes   2. Fetal growth restriction    Plan:   1. Suspected PPROM - nitrazine test positive on admission, however no fluid noted on vaginal exam. Will perform ROM Plus today.  AFI 10.9 cm on last ultrasound, will  repeat tomorrow to reassess AFI.  If AFI stable or increasing, can consider d/c home. Is now s/p full course of betamethasone. No signs of labor at this time.  Wet prep also collected.  2. Fetal growth restriction, 9%ile for growth on recent hospital limited ultrasound.  Will order Dopplers and BPP with next ultrasound. Continue NSTs q shift. 3. Continue antibiotics for latency until GBS culture returns..  4. Continue to monitor for s/s of labor or chorioamnionitis.  5. Ted hose for DVT prophylaxis.   Hildred Laserherry, Shaelin Lalley, MD [facility]

## 2017-02-23 ENCOUNTER — Inpatient Hospital Stay: Payer: Managed Care, Other (non HMO)

## 2017-02-23 ENCOUNTER — Inpatient Hospital Stay
Admit: 2017-02-23 | Discharge: 2017-02-23 | Disposition: A | Discharge status: Home / Self Care | Payer: Managed Care, Other (non HMO) | Attending: Maternal & Fetal Medicine | Admitting: Maternal & Fetal Medicine

## 2017-02-23 ENCOUNTER — Other Ambulatory Visit: Payer: Self-pay | Admitting: *Deleted

## 2017-02-23 DIAGNOSIS — O365930 Maternal care for other known or suspected poor fetal growth, third trimester, not applicable or unspecified: Secondary | ICD-10-CM

## 2017-02-23 NOTE — Progress Notes (Signed)
DC inst reviewed with pt.  Verb importance of f/u for US next wk.  Ambulatory to car for discharge

## 2017-02-23 NOTE — Discharge Instructions (Signed)
Intrauterine Growth Restriction Intrauterine growth restriction (IUGR) is when your baby is not growing normally during your pregnancy. A baby with IUGR is smaller than it should be and may weigh less than normal at birth. IUGR can result if there is a problem with the organ that supplies your baby with oxygen and nutrition (placenta). Usually, there is no way to prevent this type of problem. What are the causes? The most common cause of IUGR is a problem with the placenta or umbilical cord that causes your developing baby to get less oxygen or nutrition than needed. Other causes include:  Poor maternal nutrition.  Chemicals found in substances such as cigarettes, alcohol, and some illegal drugs.  Some prescription medicines.  Congenital defects.  Genetic disorders.  An infection.  Carrying more than one baby, such as twins or triplets (multiple gestations).  What increases the risk? This condition is more likely to develop in women who:  Are over the age of 35 at the time of delivery (advanced maternal age).  Have medical conditions such as high blood pressure, diabetes, heart or kidney disease, anemia, or conditions that increase the risk for blood clotting.  Live at a very high altitude during pregnancy.  Have a personal history or family history of IUGR.  Take medicines during pregnancy that are linked with congenital disabilities.  Have a personal or family history of a genetic disorder.  Come into contact with infected cat feces (toxoplasmosis).  Come into contact with chickenpox (varicella) or German measles (rubella).  Have or are at risk of getting an infectious disease such as syphilis, HIV, or herpes.  Eat poorly during their pregnancy.  Weigh less than 100 pounds.  Have a family history of multiple gestations.  Have had infertility treatments.  Use tobacco, illegal drugs, or drink alcohol during pregnancy.  What are the signs or symptoms? IUGR does not  cause many symptoms. You might notice that your baby does not move or kick very often. Also, your belly may not be as big as expected for the stage of your pregnancy. How is this diagnosed? This condition is diagnosed with physical and prenatal exams. Your health care provider will measure the size of your baby inside your womb during a routine screening exam using sound waves (ultrasound). Your health care provider will compare the size of your baby to the size of other babies at the same stage of development (gestational age). Your health care provider will diagnose IUGR if your baby is smaller than 90 percent of all other babies at the same gestational age. You may also have tests to find the cause of IUGR. These can include:  Having fluid removed from your womb to check for signs of infection or a congenital disability (amniocentesis).  A series of tests to monitor your baby's health (fetal monitoring).  How is this treated? In most cases, treatment for this condition focuses on stopping the cause of your baby's small growth. Your health care providers will monitor your pregnancy closely and help you manage your pregnancy. If this condition is caused by a placenta problem and the baby is not getting enough blood, treatment may include:  Medicine to start labor and deliver your baby early (induction).  Cesarean delivery. In this procedure, your baby is delivered through a cut (incision) in the abdomen and womb (uterus).  Follow these instructions at home:  Make sure you are eating enough calories and gaining enough weight.  Eat a balanced diet. Work with a nutrition specialist (  dietitian), if needed.  Rest as needed. Try to get at least eight hours of sleep every night.  Do not drink alcohol.  Do not use illegal drugs.  Do not use any tobacco products, including cigarettes, chewing tobacco, or e-cigarettes. If you need help quitting, ask your health care provider.  Talk to your  health care provider about steps you can take to avoid infections.  Take medicines, vitamins, and mineral supplements only as directed by your health care provider. Make sure that your health care provider knows about all of the prescribed or over-the-counter medicines, supplements, vitamins, eye drops, and creams that you are using.  Keep all follow-up visits as directed by your health care provider. This is important. Contact a health care provider if:  Your baby is not moving as often as before. This information is not intended to replace advice given to you by your health care provider. Make sure you discuss any questions you have with your health care provider. Document Released: 01/19/2008 Document Revised: 09/17/2015 Document Reviewed: 04/07/2014 Elsevier Interactive Patient Education  2018 [employer]vier Inc.  

## 2017-02-23 NOTE — Progress Notes (Signed)
Pt in [facility] for shift NST. Monitors applied and assessing.

## 2017-02-23 NOTE — Discharge Summary (Signed)
OB Discharge Summary     Patient Name: Alexa Hunter DOB: Feb 01, 1986 MRN: [id number]  Date of admission: 02/20/2017  Date of discharge: 02/23/2017  Admitting diagnosis: 35 weeks- fluid leaking Intrauterine pregnancy: [redacted]w[redacted]d     Secondary diagnosis:  Active Problems:   OBESITY   Labor and delivery, indication for care   History of preterm delivery, currently pregnant in third trimester   Pregnancy affected by fetal growth restriction  Additional problems: None     Discharge diagnosis: Preterm Pregnancy (with intact membranes), Fetal growth restriction                                                                  Hospital course:  The patient was admitted to Labor and Delivery on 02/20/2017 for complaints of possible ruptured membranes.  Her testing was equivocal for ruptured membranes (positive nitrazine test, but negative ROMPlus and speculum exam findings) and so the decision was made to admit for observation for further monitoring. An ultrasound was performed that noted normal AFI, however inicidental finding of fetal growth restriction was present (9th percentile). She was administered a course of antenatal steroids, and underwent q shift NSTs.  Her ultrasound was repeated again on 02/23/2017 along with a consultation by [facility]. Based on findings, this indicated intact fluid levels, but also findings consistent with fetal growth restriction. Her Doppler studies were normal.  She was able to discharge home, with strict precautions and instructions to follow up with her OB/GYN in at [facility].   Physical exam  Vitals:   02/22/17 2331 02/23/17 0444 02/23/17 0743 02/23/17 1600  BP: (!) 92/44 (!) 92/49 115/69 (!) 99/50  Pulse: (!) 55 (!) 45 60 77  Resp: 18 20 18 18   Temp: 98.1 F (36.7 C) 98.4 F (36.9 C) 98.2 F (36.8 C) 98.6 F (37 C)  TempSrc: Oral Oral Oral Oral  SpO2: 97% 97% 98% 98%  Weight:      Height:       General appearance: alert and no  distress Lungs: clear to auscultation bilaterally Heart: regular rate and rhythm, S1, S2 normal, no murmur, click, rub or gallop Abdomen: soft, non-tender; bowel sounds normal; no masses,  no organomegaly. Gravid.  Pelvic: deferred Extremities: extremities normal, atraumatic, no cyanosis or edema    Labs: Lab Results  Component Value Date   WBC 11.0 02/20/2017   HGB 12.1 02/20/2017   HCT 37.0 02/20/2017   MCV 83.9 02/20/2017   PLT 268 02/20/2017   CMP Latest Ref Rng & Units 05/22/2016  Glucose 65 - 99 mg/dL 91  BUN 6 - 20 mg/dL 7  Creatinine 2.95 - 6.21 mg/dL 3.08  Sodium 657 - 846 mmol/L 138  Potassium 3.5 - 5.1 mmol/L 4.5  Chloride 101 - 111 mmol/L 106  CO2 22 - 32 mmol/L 24  Calcium 8.9 - 10.3 mg/dL 9.6(E)  Total Protein 6.5 - 8.1 g/dL 7.2  Total Bilirubin 0.3 - 1.2 mg/dL 1.1  Alkaline Phos 38 - 126 U/L 58  AST 15 - 41 U/L 35  ALT 14 - 54 U/L 14     Imaging:  (Refer to Imaging dates 02/20/2017 and 02/23/2017 for ultrasound reports)  Discharge instruction: per After Visit Summary.  After visit meds:  Allergies as  of 02/23/2017      Reactions   Flagyl [metronidazole] Nausea And Vomiting   Oral flagyl only      Medication List    TAKE these medications   THERA-M Tabs Take by mouth.   Vitamin D3 2000 units capsule Take by mouth.       Diet: routine diet  Activity: Activity as tolerated.    Outpatient follow up:1 week Follow up Appt:Future Appointments Date Time Provider Department Center  03/02/2017 9:00 AM [facility] US 1 [facility]  03/09/2017 10:00 AM [facility]   Follow up Visit:No Follow-up on file.   02/23/2017 Hildred LaserAnika Devario Bucklew, MD

## 2017-02-23 NOTE — Progress Notes (Signed)
Patient refused NST throughout night.  Patient stated that she was "just too tired" and wanted to get some rest.

## 2017-02-23 NOTE — Progress Notes (Signed)
  Antenatal Progress Note  Subjective:     HPI:   Alexa Hunter is a 31 y.o. female at 4922w0d, Estimated Date of Delivery: 03/30/17 by 1st trimester ultrasound,  who was admitted for possible PPROM and fetal growth restriction.  HD# 4. Patient has completed full course of antenatal steroids.   Subjective:  Patient denies major complaints. Just tired.   Review of Systems Denies contractions, vaginal bleeding, and reports good fetal movement. Notes that she has not felt any further "gushes"    Objective:   Vitals:   02/22/17 1856 02/22/17 2331 02/23/17 0444 02/23/17 0743  BP: 108/62 (!) 92/44 (!) 92/49 115/69  Pulse: (!) 49 (!) 55 (!) 45 60  Resp: 20 18 20 18   Temp: 98.2 F (36.8 C) 98.1 F (36.7 C) 98.4 F (36.9 C) 98.2 F (36.8 C)  TempSrc: Oral Oral Oral Oral  SpO2: 96% 97% 97% 98%  Weight:      Height:        General appearance: alert and no distress Lungs: clear to auscultation bilaterally Heart: regular rate and rhythm, S1, S2 normal, no murmur, click, rub or gallop Abdomen: soft, non-tender; bowel sounds normal; no masses,  no organomegaly. Gravid.  Pelvic: deferred Extremities: extremities normal, atraumatic, no cyanosis or edema   FHT: (see NST below, performed at ~ 9:00 a.m.)  Fetal Heart Rate A Mode: External Baseline Rate (A): 135 bpm Variability: Moderate Accelerations: 15 x 15 Decelerations: None  Uterine Activity Mode: Toco Contraction Frequency (min): None Contraction Duration (sec): 50 Contraction Quality: Mild Resting Tone Palpated: Relaxed Resting Time: Adequate   Assessment:  31 y.o. [id number] female 4522w0d, Estimated Date of Delivery: 03/30/17 with:    1. Preterm premature rupture of membranes   2. Fetal growth restriction    Plan:   1. Suspected PPROM - nitrazine test positive on admission, however no fluid noted on vaginal exam. ROMPlus test negative.  AFI 10.9 cm on last ultrasound, will repeat today to reassess AFI.  If AFI  stable or increasing, can consider d/c home as patient was either not ruptured, or had a small leak and may reseal. Is now s/p full course of betamethasone.  No signs of labor at this time.  All other testing negative. 2. Fetal growth restriction, 9%ile for growth on recent hospital limited ultrasound. [facility] noted growth at 4 9%ile. Will order Dopplers and BPP with next ultrasound. Continue NSTs q shift.  Will await recommendations by [facility] as far as further management. Likely will require IOL at 37 weeks if growth restriction continues.  3. Antibiotics discontinued as patient is GBS negative.  4. Continue to monitor for s/s of labor or chorioamnionitis.  5. Ted hose for DVT prophylaxis.   Dispo: Pending recommendations by [facility]. If patient stable to d/c home, can discharge today.  If recommendations are for delivery, will proceed as directed.   Alexa Hunter, Alexa Mordan, MD [facility]

## 2017-02-25 ENCOUNTER — Inpatient Hospital Stay
Admission: EM | Admit: 2017-02-25 | Discharge: 2017-02-27 | DRG: 807 | Disposition: A | Discharge status: Home / Self Care | Payer: Managed Care, Other (non HMO) | Attending: Obstetrics and Gynecology | Admitting: Obstetrics and Gynecology

## 2017-02-25 ENCOUNTER — Encounter: Payer: Self-pay | Admitting: *Deleted

## 2017-02-25 ENCOUNTER — Inpatient Hospital Stay: Payer: Managed Care, Other (non HMO) | Admitting: Anesthesiology

## 2017-02-25 DIAGNOSIS — O365930 Maternal care for other known or suspected poor fetal growth, third trimester, not applicable or unspecified: Secondary | ICD-10-CM | POA: Diagnosis present

## 2017-02-25 DIAGNOSIS — Z3A35 35 weeks gestation of pregnancy: Secondary | ICD-10-CM | POA: Diagnosis not present

## 2017-02-25 DIAGNOSIS — O9081 Anemia of the puerperium: Secondary | ICD-10-CM | POA: Diagnosis not present

## 2017-02-25 DIAGNOSIS — O42913 Preterm premature rupture of membranes, unspecified as to length of time between rupture and onset of labor, third trimester: Secondary | ICD-10-CM | POA: Diagnosis not present

## 2017-02-25 DIAGNOSIS — O42919 Preterm premature rupture of membranes, unspecified as to length of time between rupture and onset of labor, unspecified trimester: Secondary | ICD-10-CM | POA: Diagnosis present

## 2017-02-25 DIAGNOSIS — O09213 Supervision of pregnancy with history of pre-term labor, third trimester: Secondary | ICD-10-CM

## 2017-02-25 DIAGNOSIS — O09893 Supervision of other high risk pregnancies, third trimester: Secondary | ICD-10-CM

## 2017-02-25 DIAGNOSIS — O99844 Bariatric surgery status complicating childbirth: Secondary | ICD-10-CM | POA: Diagnosis present

## 2017-02-25 DIAGNOSIS — O365990 Maternal care for other known or suspected poor fetal growth, unspecified trimester, not applicable or unspecified: Secondary | ICD-10-CM | POA: Diagnosis present

## 2017-02-25 LAB — CBC
HCT: 35.4 % (ref 35.0–47.0)
Hemoglobin: 11.5 g/dL — ABNORMAL LOW (ref 12.0–16.0)
MCH: 27.5 pg (ref 26.0–34.0)
MCHC: 32.7 g/dL (ref 32.0–36.0)
MCV: 84.2 fL (ref 80.0–100.0)
PLATELETS: 233 10*3/uL (ref 150–440)
RBC: 4.2 MIL/uL (ref 3.80–5.20)
RDW: 14.3 % (ref 11.5–14.5)
WBC: 14 10*3/uL — ABNORMAL HIGH (ref 3.6–11.0)

## 2017-02-25 LAB — RAPID HIV SCREEN (HIV 1/2 AB+AG)
HIV 1/2 Antibodies: NONREACTIVE
HIV-1 P24 Antigen - HIV24: NONREACTIVE

## 2017-02-25 LAB — TYPE AND SCREEN
ABO/RH(D): O POS
Antibody Screen: NEGATIVE

## 2017-02-25 MED ORDER — BUPIVACAINE HCL (PF) 0.25 % IJ SOLN
INTRAMUSCULAR | Status: DC | PRN
Start: 2017-02-25 — End: 2017-02-26
  Administered 2017-02-25: 10 mL via EPIDURAL

## 2017-02-25 MED ORDER — AMMONIA AROMATIC IN INHA
RESPIRATORY_TRACT | Status: AC
  Filled 2017-02-25: qty 10

## 2017-02-25 MED ORDER — BUTORPHANOL TARTRATE 2 MG/ML IJ SOLN
1.0000 mg | INTRAMUSCULAR | Status: DC | PRN
Start: 2017-02-25 — End: 2017-02-25

## 2017-02-25 MED ORDER — DIBUCAINE 1 % RE OINT: 1.0000 "application " | TOPICAL_OINTMENT | RECTAL | Status: DC | PRN

## 2017-02-25 MED ORDER — ACETAMINOPHEN 325 MG PO TABS: 650.0000 mg | ORAL_TABLET | ORAL | Status: DC | PRN

## 2017-02-25 MED ORDER — SOD CITRATE-CITRIC ACID 500-334 MG/5ML PO SOLN: 30.0000 mL | ORAL | Status: DC | PRN

## 2017-02-25 MED ORDER — LIDOCAINE HCL (PF) 1 % IJ SOLN
INTRAMUSCULAR | Status: AC
  Filled 2017-02-25: qty 30

## 2017-02-25 MED ORDER — SODIUM CHLORIDE FLUSH 0.9 % IV SOLN
INTRAVENOUS | Status: AC
  Filled 2017-02-25: qty 20

## 2017-02-25 MED ORDER — BENZOCAINE-MENTHOL 20-0.5 % EX AERO
1.0000 "application " | INHALATION_SPRAY | CUTANEOUS | Status: DC | PRN
  Administered 2017-02-26: 1 via TOPICAL
  Filled 2017-02-25: qty 56

## 2017-02-25 MED ORDER — ONDANSETRON HCL 4 MG PO TABS: 4.0000 mg | ORAL_TABLET | ORAL | Status: DC | PRN

## 2017-02-25 MED ORDER — ONDANSETRON HCL 4 MG/2ML IJ SOLN: 4.0000 mg | Freq: Four times a day (QID) | INTRAMUSCULAR | Status: DC | PRN

## 2017-02-25 MED ORDER — IBUPROFEN 800 MG PO TABS
800.0000 mg | ORAL_TABLET | Freq: Four times a day (QID) | ORAL | Status: DC
  Administered 2017-02-26 – 2017-02-27 (×6): 800 mg via ORAL
  Filled 2017-02-25 (×7): qty 1

## 2017-02-25 MED ORDER — LIDOCAINE HCL (PF) 1 % IJ SOLN
INTRAMUSCULAR | Status: DC | PRN
  Administered 2017-02-25 (×2): 3 mL

## 2017-02-25 MED ORDER — ONDANSETRON HCL 4 MG/2ML IJ SOLN: 4.0000 mg | INTRAMUSCULAR | Status: DC | PRN

## 2017-02-25 MED ORDER — PRENATAL MULTIVITAMIN CH
1.0000 | ORAL_TABLET | Freq: Every day | ORAL | Status: DC
  Administered 2017-02-26 – 2017-02-27 (×2): 1 via ORAL
  Filled 2017-02-25 (×2): qty 1

## 2017-02-25 MED ORDER — OXYTOCIN 40 UNITS IN LACTATED RINGERS INFUSION - SIMPLE MED
2.5000 [IU]/h | INTRAVENOUS | Status: DC
  Administered 2017-02-25: 2.5 [IU]/h via INTRAVENOUS

## 2017-02-25 MED ORDER — LIDOCAINE-EPINEPHRINE (PF) 1.5 %-1:200000 IJ SOLN
INTRAMUSCULAR | Status: DC | PRN
  Administered 2017-02-25: 3 mL via PERINEURAL

## 2017-02-25 MED ORDER — OXYCODONE-ACETAMINOPHEN 5-325 MG PO TABS: 2.0000 | ORAL_TABLET | ORAL | Status: DC | PRN

## 2017-02-25 MED ORDER — OXYTOCIN 40 UNITS IN LACTATED RINGERS INFUSION - SIMPLE MED
INTRAVENOUS | Status: AC
  Administered 2017-02-25: 2 m[IU]/min via INTRAVENOUS
  Filled 2017-02-25: qty 1000

## 2017-02-25 MED ORDER — MISOPROSTOL 25 MCG QUARTER TABLET: 50.0000 ug | ORAL_TABLET | ORAL | Status: DC | PRN

## 2017-02-25 MED ORDER — OXYTOCIN 10 UNIT/ML IJ SOLN
INTRAMUSCULAR | Status: AC
  Filled 2017-02-25: qty 2

## 2017-02-25 MED ORDER — COCONUT OIL OIL
1.0000 "application " | TOPICAL_OIL | Status: DC | PRN
  Administered 2017-02-26: 1 via TOPICAL
  Filled 2017-02-25: qty 120

## 2017-02-25 MED ORDER — MISOPROSTOL 200 MCG PO TABS
ORAL_TABLET | ORAL | Status: AC
  Filled 2017-02-25: qty 4

## 2017-02-25 MED ORDER — TERBUTALINE SULFATE 1 MG/ML IJ SOLN: 0.2500 mg | Freq: Once | INTRAMUSCULAR | Status: DC | PRN

## 2017-02-25 MED ORDER — OXYTOCIN 40 UNITS IN LACTATED RINGERS INFUSION - SIMPLE MED
1.0000 m[IU]/min | INTRAVENOUS | Status: DC
  Administered 2017-02-25: 12 m[IU]/min via INTRAVENOUS
  Administered 2017-02-25: 2 m[IU]/min via INTRAVENOUS
  Filled 2017-02-25: qty 1000

## 2017-02-25 MED ORDER — LACTATED RINGERS IV SOLN
500.0000 mL | INTRAVENOUS | Status: DC | PRN
Start: 2017-02-25 — End: 2017-02-25

## 2017-02-25 MED ORDER — WITCH HAZEL-GLYCERIN EX PADS: 1.0000 "application " | MEDICATED_PAD | CUTANEOUS | Status: DC | PRN

## 2017-02-25 MED ORDER — OXYCODONE-ACETAMINOPHEN 5-325 MG PO TABS: 1.0000 | ORAL_TABLET | ORAL | Status: DC | PRN

## 2017-02-25 MED ORDER — ZOLPIDEM TARTRATE 5 MG PO TABS: 5.0000 mg | ORAL_TABLET | Freq: Every evening | ORAL | Status: DC | PRN

## 2017-02-25 MED ORDER — DIPHENHYDRAMINE HCL 25 MG PO CAPS: 25.0000 mg | ORAL_CAPSULE | Freq: Four times a day (QID) | ORAL | Status: DC | PRN

## 2017-02-25 MED ORDER — LIDOCAINE HCL (PF) 1 % IJ SOLN: 30.0000 mL | INTRAMUSCULAR | Status: DC | PRN

## 2017-02-25 MED ORDER — OXYTOCIN BOLUS FROM INFUSION
500.0000 mL | Freq: Once | INTRAVENOUS | Status: AC
  Administered 2017-02-25: 500 mL via INTRAVENOUS

## 2017-02-25 MED ORDER — LACTATED RINGERS IV SOLN
INTRAVENOUS | Status: DC
  Administered 2017-02-25 (×3): via INTRAVENOUS

## 2017-02-25 MED ORDER — SENNOSIDES-DOCUSATE SODIUM 8.6-50 MG PO TABS
2.0000 | ORAL_TABLET | ORAL | Status: DC
  Administered 2017-02-26: 2 via ORAL
  Filled 2017-02-25: qty 2

## 2017-02-25 MED ORDER — ACETAMINOPHEN 325 MG PO TABS
650.0000 mg | ORAL_TABLET | ORAL | Status: DC | PRN
Start: 2017-02-25 — End: 2017-02-27

## 2017-02-25 MED ORDER — FENTANYL 2.5 MCG/ML W/ROPIVACAINE 0.15% IN NS 100 ML EPIDURAL (ARMC)
EPIDURAL | Status: AC
  Filled 2017-02-25: qty 100

## 2017-02-25 MED ORDER — SIMETHICONE 80 MG PO CHEW: 80.0000 mg | CHEWABLE_TABLET | ORAL | Status: DC | PRN

## 2017-02-25 NOTE — OB Triage Note (Signed)
Started cramping @ 1000 today. Gush of fluid @ 1100 and another @ 1200. States she has a history of preterm delivery @ 34 weeks.. Was on 17P but did not receive her shot this past week. [name], Alexa Hunter

## 2017-02-25 NOTE — Progress Notes (Signed)
Intrapartum Progress Note  S: Patient notes feeling her contractions.  O: Blood pressure 130/79, pulse (!) 58, temperature 98.5 F (36.9 C), temperature source Oral, resp. rate 18, height 5\' 4"  (1.626 m), weight 217 lb (98.4 kg), last menstrual period 01/24/2016. Gen App: NAD, comfortable Abdomen: soft, gravid FHT: baseline 150 bpm.  Accels present.  Decels absent. moderate in degree variability.   Tocometer: contractions q 2-3 minutes Cervix: 3.5/60/-1/cephalic/forebag present.  Extremities: Nontender, no edema.  Pitocin: 12 mIU  Labs:  No new labs   Assessment:  1: SIUP at 5554w2d 2. PPROM 3. H/o PPROM and preterm delivery in prior pregnancy 4. GBS negative   Plan:  1. Continue IOL with Pitocin 2. Can use IV pain meds for now until active labor ensues, then can have epidural.  3. Continue to anticipate vaginal delivery.    Hildred Laserherry, Korinna Tat, MD 02/25/2017 7:44 PM

## 2017-02-25 NOTE — H&P (Addendum)
Obstetric History and Physical  Kinberly Xenia Nile is a 31 y.o. [phone] with IUP at [redacted]w[redacted]d presenting for complaints of leaking fluid since 1100 this morning. Patient states she has been having  irregular, every 5-6 minutes contractions, none vaginal bleeding, ruptured, clear fluid membranes, with active fetal movement.    Of note, patient was recently discharged after a 3 day hospitalization for suspected PPROM earlier this week. At the time of her discharge, she had received a full course of antenatal steroids.  Her AFI actually increased from 10.9 to 19.7 cm, and she was deemed to still have intact membranes based on further testing.  Incidentally the fetus was noted to be growth restricted at 7th percentile on recent admission.   Prenatal Course Source of Care: [facility] with onset of care at 8 weeks Pregnancy complications or risks: Patient Active Problem List   Diagnosis Date Noted  . Preterm premature rupture of membranes 02/25/2017  . Pregnancy affected by fetal growth restriction 02/22/2017  . Labor and delivery, indication for care 02/20/2017  . History of preterm delivery, currently pregnant in third trimester 02/20/2017  . Adnexal cyst 11/24/2015  . History of depression 05/13/2015  . Plantar fasciitis, right 03/14/2014  . History of abnormal cervical Pap smear 05/21/2013  . Eczema 12/10/2010  . HYPERLIPIDEMIA 03/23/2010  . OBESITY 03/23/2010  . MIGRAINE HEADACHE 08/26/2008  . 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 RHINITIS, ALLERGIC NOS 03/17/2006  . ASTHMA 03/17/2006   She plans to breastfeed She desires vasectomy for postpartum contraception.   Prenatal labs and studies: ABO, Rh: --/--/O POS (10/29 1259) Antibody: NEG (10/29 1259) Rubella: Immune (04/18 0000) RPR: Non Reactive (10/29 1259)  HBsAg: Negative (04/18 0000)  HIV: Non-reactive (10/26 0000)  JWJ:XBJYNWGN (10/31 0000) 1 hr Glucola  (not performed due to h/o bariatric surgery). Patient did not  complete alternative screening.  Genetic screening declined Anatomy US normal   Past Medical History:  Diagnosis Date  . Abnormal Pap smear of cervix   . Asthma   . Contact dermatitis     Past Surgical History:  Procedure Laterality Date  . CHOLECYSTECTOMY  2013  . 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 none      OB History  Gravida Para Term Preterm AB Living  4 1   1 2 1   SAB TAB Ectopic Multiple Live Births  2       1    # Outcome Date GA Lbr Len/2nd Weight Sex Delivery Anes PTL Lv  4 Current           3 Preterm 2013 [redacted]w[redacted]d  4 lb (1.814 kg) M   N LIV     Complications: Preterm premature rupture of membranes  2 SAB  [redacted]w[redacted]d         1 SAB  [redacted]w[redacted]d             Social History   Social History  . Marital status: Married    Spouse name: N/A  . Number of children: N/A  . Years of education: N/A   Occupational History  . [employer]    Social History Main Topics  . Smoking status: Never Smoker  . Smokeless tobacco: Never Used  . Alcohol use No  . Drug use: No  . Sexual activity: Yes    Birth control/ protection: Pill, Condom   Other Topics Concern  . None   Social History Narrative   Has a son.  Family History  Problem Relation Age of Onset  . Migraines Mother   . Hypertension Mother   . Diabetes Maternal Grandmother   . Diabetes Sister     Prescriptions Prior to Admission  Medication Sig Dispense Refill Last Dose  . Cholecalciferol (VITAMIN D3) 2000 units capsule Take by mouth.   02/24/2017 at Unknown time  . hydroxyprogesterone caproate (MAKENA) 250 mg/mL OIL injection Inject 250 mg into the muscle once.   Past Month at Unknown time  . Prenatal Vit-Fe Fumarate-FA (MULTIVITAMIN-PRENATAL) 27-0.8 MG TABS tablet Take 1 tablet by mouth daily at 12 noon.   02/24/2017 at Unknown time  . Multiple Vitamins-Minerals (THERA-M) TABS Take by mouth.   Not Taking at Unknown time    Allergies  Allergen Reactions  . Flagyl [Metronidazole]  Nausea And Vomiting    Oral flagyl only    Review of Systems: Negative except for what is mentioned in HPI.  Physical Exam: BP 125/74 (BP Location: Left Arm)   Pulse (!) 53   Temp 98 F (36.7 C) (Oral)   Resp 18   Ht 5\' 4"  (1.626 m)   Wt 217 lb (98.4 kg)   LMP 01/24/2016 (LMP Unknown)   BMI 37.25 kg/m  CONSTITUTIONAL: Well-developed, well-nourished female in no acute distress.  HENT:  Normocephalic, atraumatic, External right and left ear normal. Oropharynx is clear and moist EYES: Conjunctivae and EOM are normal. Pupils are equal, round, and reactive to light. No scleral icterus.  NECK: Normal range of motion, supple, no masses SKIN: Skin is warm and dry. No rash noted. Not diaphoretic. No erythema. No pallor. NEUROLOGIC: Alert and oriented to person, place, and time. Normal reflexes, muscle tone coordination. No cranial nerve deficit noted. PSYCHIATRIC: Normal mood and affect. Normal behavior. Normal judgment and thought content. CARDIOVASCULAR: Normal heart rate noted, regular rhythm RESPIRATORY: Effort and breath sounds normal, no problems with respiration noted ABDOMEN: Soft, nontender, nondistended, gravid. MUSCULOSKELETAL: Normal range of motion. No edema and no tenderness. 2+ distal pulses.  Cervical Exam: Dilatation 2cm   Effacement 50-60%   Station -2.  Gross pooling noted.    Presentation: cephalic FHT:  Baseline rate 150 bpm   Variability moderate  Accelerations present   Decelerations none Contractions: infrequent   Pertinent Labs/Studies:   Results for orders placed or performed during the hospital encounter of 02/25/17 (from the past 24 hour(s))  CBC     Status: Abnormal   Collection Time: 02/25/17  2:45 PM  Result Value Ref Range   WBC 14.0 (H) 3.6 - 11.0 K/uL   RBC 4.20 3.80 - 5.20 MIL/uL   Hemoglobin 11.5 (L) 12.0 - 16.0 g/dL   HCT 13.035.4 86.535.0 - 78.447.0 %   MCV 84.2 80.0 - 100.0 fL   MCH 27.5 26.0 - 34.0 pg   MCHC 32.7 32.0 - 36.0 g/dL   RDW 69.614.3 29.511.5 - 28.414.5  %   Platelets 233 150 - 440 K/uL  Type and screen [facility]     Status: None (Preliminary result)   Collection Time: 02/25/17  2:45 PM  Result Value Ref Range   ABO/RH(D) PENDING    Antibody Screen PENDING    Sample Expiration 02/28/2017      Imaging:  MFM ultrasound on 02/23/2017.  Ultrasound demonstrates a single, live IUP at 35 weeks.  The amniotic fluid volume is normal(17.9 cm) and active fetal movements are seen.  The estimated fetal weight is 2078 g (7th%ile).  The umbilical artery dopplers area within normal  limits for gestational age.    Detailed evaluation of the fetal anatomy was performed.  Images of the fetal abdominal cord insertion, cardiav 3VV, IVC/SVD were suboptimally seen.  The remainder of the detailed anatomic survey was unremarkable.   Recommendations:  1. NST/AFI twice weekly and dopplers weekly. Follow up growth in 3 weeks.  2. Recommend delivery between 37-39 weeks.  We initially discussed delivery at 37 weeks (EFW on ultrasound reporting package was at a lower percentile, however the reporting package  Here is the one we conventionally use and places the EFW at 7th percentile.  If antenatal testing or interval growth is not reassuring, earlier delivery can be planned.   Assessment : Jazlynne Milliner is a 31 y.o. [phone] at [redacted]w[redacted]d being admitted for induction of labor due to PPROM.   Plan: Labor:  Induction/Augmentation as ordered as per protocol with Pitocin. Analgesia as needed. FWB: Reassuring fetal heart tracing.  GBS negative Delivery plan: Hopeful for vaginal delivery    Hildred Laser, MD [facility]

## 2017-02-25 NOTE — Anesthesia Procedure Notes (Signed)
Epidural Patient location during procedure: OB Start time: 02/25/2017 8:50 PM End time: 02/25/2017 9:10 PM  Staffing Anesthesiologist: Yves DillARROLL, Sholanda Croson Performed: anesthesiologist   Preanesthetic Checklist Completed: patient identified, site marked, surgical consent, pre-op evaluation, timeout performed, IV checked, risks and benefits discussed and monitors and equipment checked  Epidural Patient position: sitting Prep: Betadine Patient monitoring: heart rate, continuous pulse ox and blood pressure Approach: midline Location: L3-L4 Injection technique: LOR air  Needle:  Needle type: Tuohy  Needle gauge: 17 G Needle length: 9 cm and 9 Catheter type: closed end flexible Catheter size: 19 Gauge Test dose: negative and 1.5% lidocaine with Epi 1:200 K  Assessment Events: blood not aspirated, injection not painful, no injection resistance, negative IV test and no paresthesia  Additional Notes Patient placed in sitting position.  Time out called .  Back prepped and draped in sterile fashion.  A skin wheal was made in the L3-L4 interspace with 1% Lidocaine plain.  Challenging epidural with the angle of the needle to the right from midline.  Good loss of resistance with no blood or paresthesias or fluid.  Epidural catheter threaded 3 cm and the TD was negative.  Catheter was affixed to the back in sterile fashion.  Patient tolerated the procedure well.Reason for block:procedure for pain

## 2017-02-26 DIAGNOSIS — O42913 Preterm premature rupture of membranes, unspecified as to length of time between rupture and onset of labor, third trimester: Secondary | ICD-10-CM

## 2017-02-26 DIAGNOSIS — Z3A35 35 weeks gestation of pregnancy: Secondary | ICD-10-CM

## 2017-02-26 LAB — CBC
HCT: 34 % — ABNORMAL LOW (ref 35.0–47.0)
HEMOGLOBIN: 10.7 g/dL — AB (ref 12.0–16.0)
MCH: 26.5 pg (ref 26.0–34.0)
MCHC: 31.4 g/dL — AB (ref 32.0–36.0)
MCV: 84.6 fL (ref 80.0–100.0)
PLATELETS: 195 10*3/uL (ref 150–440)
RBC: 4.02 MIL/uL (ref 3.80–5.20)
RDW: 14.5 % (ref 11.5–14.5)
WBC: 16.8 10*3/uL — ABNORMAL HIGH (ref 3.6–11.0)

## 2017-02-26 LAB — RPR: RPR: NONREACTIVE

## 2017-02-26 MED ORDER — METHYLERGONOVINE MALEATE 0.2 MG/ML IJ SOLN
INTRAMUSCULAR | Status: AC
  Administered 2017-02-26: 0.2 mg via INTRAMUSCULAR
  Filled 2017-02-26: qty 1

## 2017-02-26 MED ORDER — METHYLERGONOVINE MALEATE 0.2 MG PO TABS: 0.2000 mg | ORAL_TABLET | ORAL | Status: DC | PRN

## 2017-02-26 MED ORDER — METHYLERGONOVINE MALEATE 0.2 MG/ML IJ SOLN
0.2000 mg | INTRAMUSCULAR | Status: DC | PRN
  Administered 2017-02-26: 0.2 mg via INTRAMUSCULAR

## 2017-02-26 NOTE — Progress Notes (Signed)
Post Partum Day # 1, s/p SVD  Subjective: no complaints, up ad lib, voiding and tolerating PO  Objective: Temp:  [98 F (36.7 C)-98.5 F (36.9 C)] 98 F (36.7 C) (11/04 0308) Pulse Rate:  [47-117] 58 (11/04 0430) Resp:  [18] 18 (11/04 0308) BP: (107-183)/(57-133) 136/71 (11/04 0308) SpO2:  [95 %-100 %] 97 % (11/04 0308) Weight:  [217 lb (98.4 kg)] 217 lb (98.4 kg) (11/03 1306)  Physical Exam:  General: alert and no distress  Lungs: clear to auscultation bilaterally Breasts: normal appearance, no masses or tenderness Heart: regular rate and rhythm, S1, S2 normal, no murmur, click, rub or gallop Abdomen: soft, non-tender; bowel sounds normal; no masses,  no organomegaly Pelvis: Lochia: appropriate, Uterine Fundus: firm Extremities: DVT Evaluation: No evidence of DVT seen on physical exam. Negative Homan's sign. No cords or calf tenderness. No significant calf/ankle edema.  Recent Labs    02/25/17 1445 02/26/17 0530  HGB 11.5* 10.7*  HCT 35.4 34.0*    Assessment/Plan: Doing well postpartum Breastfeeding, Lactation consult Circumcision prior to discharge  Contraception: planning for vasectomy Anemia, mild postpartum. Asymptomatic. Will treat with  PNV with iron.  Plan for discharge tomorrow.    LOS: 1 day   Hildred Laserherry, Reona Zendejas, MD [facility] 02/26/2017 8:56 AM

## 2017-02-26 NOTE — Anesthesia Preprocedure Evaluation (Signed)
Anesthesia Evaluation  Patient identified by MRN, date of birth, ID band Patient awake    Reviewed: Allergy & Precautions, NPO status , Patient's Chart, lab work & pertinent test results  Airway Mallampati: II  TM Distance: >3 FB     Dental no notable dental hx.    Pulmonary asthma ,    Pulmonary exam normal        Cardiovascular negative cardio ROS Normal cardiovascular exam     Neuro/Psych  Headaches, negative psych ROS   GI/Hepatic negative GI ROS, Neg liver ROS,   Endo/Other  negative endocrine ROS  Renal/GU negative Renal ROS     Musculoskeletal negative musculoskeletal ROS (+)   Abdominal Normal abdominal exam  (+)   Peds negative pediatric ROS (+)  Hematology negative hematology ROS (+)   Anesthesia Other Findings   Reproductive/Obstetrics (+) Pregnancy                             Anesthesia Physical Anesthesia Plan  ASA: II  Anesthesia Plan: Epidural   Post-op Pain Management:    Induction:   PONV Risk Score and Plan: 2  Airway Management Planned:   Additional Equipment:   Intra-op Plan:   Post-operative Plan:   Informed Consent: I have reviewed the patients History and Physical, chart, labs and discussed the procedure including the risks, benefits and alternatives for the proposed anesthesia with the patient or authorized representative who has indicated his/her understanding and acceptance.   Dental advisory given  Plan Discussed with: CRNA and Surgeon  Anesthesia Plan Comments:         Anesthesia Quick Evaluation

## 2017-02-26 NOTE — Progress Notes (Signed)
Dr Valentino Saxonherry called, made aware pt having increased bleeding with handful clots with most recent fundal massage. Per MD, administer Methergine IM per order.

## 2017-02-26 NOTE — Anesthesia Postprocedure Evaluation (Signed)
Anesthesia Post Note  Patient: Alexa Hunter  Procedure(s) Performed: AN AD HOC LABOR EPIDURAL  Patient location during evaluation: Mother Baby Anesthesia Type: Epidural Level of consciousness: awake and alert and oriented Pain management: pain level controlled Vital Signs Assessment: post-procedure vital signs reviewed and stable Respiratory status: spontaneous breathing Cardiovascular status: blood pressure returned to baseline Postop Assessment: no headache and patient able to bend at knees Anesthetic complications: no Comments: Patient ambulating well.  Challenging epidural and precipitous delivery.  Mild back discomfort getting better..     Last Vitals:  Vitals:   02/26/17 1659 02/26/17 1915  BP: 114/77 129/84  Pulse: (!) 55 79  Resp: 18 18  Temp: 36.7 C 36.8 C  SpO2:  99%    Last Pain:  Vitals:   02/26/17 1915  TempSrc: Oral  PainSc:                  Dillin Lofgren

## 2017-02-26 NOTE — Plan of Care (Signed)
Mother able to demonstrate appropriate self care. Denies c/o and states effective pain control with scheduled Motrin. Lochial Flow is small and Fundus is at U/-1.

## 2017-02-27 MED ORDER — IBUPROFEN 800 MG PO TABS: 800.0000 mg | ORAL_TABLET | Freq: Three times a day (TID) | ORAL | 1 refills | Status: DC | PRN

## 2017-02-27 NOTE — Discharge Instructions (Signed)
Breastfeeding Challenges and Solutions  Even though breastfeeding is natural, it can be challenging, especially in the first few weeks after childbirth. It is normal for problems to arise when starting to breastfeed your new baby, even if you have breastfed before. This document provides some solutions to the most common breastfeeding challenges.  Challenges and solutions  Challenge--Cracked or Sore Nipples  Cracked or sore nipples are commonly experienced by breastfeeding mothers. Cracked or sore nipples often are caused by inadequate latching (when your baby's mouth attaches to your breast to breastfeed). Soreness can also happen if your baby is not positioned properly at your breast. Although nipple cracking and soreness are common during the first week after birth, nipple pain is never normal. If you experience nipple cracking or soreness that lasts longer than 1 week or nipple pain, call your health care provider or lactation consultant.  Solution  Ensure proper latching and positioning of your baby by following the steps below:  · Find a comfortable place to sit or lie down, with your neck and back well supported.  · Place a pillow or rolled up blanket under your baby to bring him or her to the level of your breast (if you are seated).  · Make sure that your baby's abdomen is facing your abdomen.  · Gently massage your breast. With your fingertips, massage from your chest wall toward your nipple in a circular motion. This encourages milk flow. You may need to continue this action during the feeding if your milk flows slowly.  · Support your breast with 4 fingers underneath and your thumb above your nipple. Make sure your fingers are well away from your nipple and your baby’s mouth.  · Stroke your baby's lips gently with your finger or nipple.  · When your baby's mouth is open wide enough, quickly bring your baby to your breast, placing your entire nipple and as much of the colored area around your nipple  (areola) as possible into your baby's mouth.  ? More areola should be visible above your baby's upper lip than below the lower lip.  ? Your baby's tongue should be between his or her lower gum and your breast.  · Ensure that your baby's mouth is correctly positioned around your nipple (latched). Your baby's lips should create a seal on your breast and be turned out (everted).  · It is common for your baby to suck for about 2-3 minutes in order to start the flow of breast milk.    Signs that your baby has successfully latched on to your nipple include:  · Quietly tugging or quietly sucking without causing you pain.  · Swallowing heard between every 3-4 sucks.  · Muscle movement above and in front of his or her ears with sucking.    Signs that your baby has not successfully latched on to nipple include:  · Sucking sounds or smacking sounds from your baby while nursing.  · Nipple pain.    Ensure that your breasts stay moisturized and healthy by:  · Avoiding the use of soap on your nipples.  · Wearing a supportive bra. Avoid wearing underwire-style bras or tight bras.  · Air drying your nipples for 3-4 minutes after each feeding.  · Using only cotton bra pads to absorb breast milk leakage. Leaking of breast milk between feedings is normal. Be sure to change the pads if they become soaked with milk.  · Using lanolin on your nipples after nursing. Lanolin helps to maintain your   skin's normal moisture barrier. If you use pure lanolin you do not need to wash it off before feeding your baby again. Pure lanolin is not toxic to your baby. You may also hand express a few drops of breast milk and gently massage that milk into your nipples, allowing it to air dry.    Challenge--Breast Engorgement  Breast engorgement is the overfilling of your breasts with breast milk. In the first few weeks after giving birth, you may experience breast engorgement. Breast engorgement can make your breasts throb and feel hard, tightly stretched,  warm, and tender. Engorgement peaks about the fifth day after you give birth. Having breast engorgement does not mean you have to stop breastfeeding your baby.  Solution  · Breastfeed when you feel the need to reduce the fullness of your breasts or when your baby shows signs of hunger. This is called "breastfeeding on demand."  · Newborns (babies younger than 4 weeks) often breastfeed every 1-3 hours during the day. You may need to awaken your baby to feed if he or she is asleep at a feeding time.  · Do not allow your baby to sleep longer than 5 hours during the night without a feeding.  · Pump or hand express breast milk before breastfeeding to soften your breast, areola, and nipple.  · Apply warm, moist heat (in the shower or with warm water-soaked hand towels) just before feeding or pumping, or massage your breast before or during breastfeeding. This increases circulation and helps your milk to flow.  · Completely empty your breasts when breastfeeding or pumping. Afterward, wear a snug bra (nursing or regular) or tank top for 1-2 days to signal your body to slightly decrease milk production. Only wear snug bras or tank tops to treat engorgement. Tight bras typically should be avoided by breastfeeding mothers. Once engorgement is relieved, return to wearing regular, loose-fitting clothes.  · Apply ice packs to your breasts to lessen the pain from engorgement and relieve swelling, unless the ice is uncomfortable for you.  · Do not delay feedings. Try to relax when it is time to feed your baby. This helps to trigger your "let-down reflex," which releases milk from your breast.  · Ensure your baby is latched on to your breast and positioned properly while breastfeeding.  · Allow your baby to remain at your breast as long as he or she is latched on well and actively sucking. Your baby will let you know when he or she is done breastfeeding by pulling away from your breast or falling asleep.  · Avoid introducing bottles  or pacifiers to your baby in the early weeks of breastfeeding. Wait to introduce these things until after resolving any breastfeeding challenges.  · Try to pump your milk on the same schedule as when your baby would breastfeed if you are returning to work or away from home for an extended period.  · Drink plenty of fluids to avoid dehydration, which can eventually put you at greater risk of breast engorgement.    If you follow these suggestions, your engorgement should improve in 24-48 hours. If you are still experiencing difficulty, call your lactation consultant or health care provider.  Challenge--Plugged Milk Ducts  Plugged milk ducts occur when the duct does not drain milk effectively and becomes swollen. Wearing a tight-fitting nursing bra or having difficulty with latching may cause plugged milk ducts. Not drinking enough water (8-10 c [1.9-2.4 L] per day) can contribute to plugged milk ducts. Once a   duct has become plugged, hard lumps, soreness, and redness may develop in your breast.  Solution  Do not delay feedings. Feed your baby frequently and try to empty your breasts of milk at each feeding. Try breastfeeding from the affected side first so there is a better chance that the milk will drain completely from that breast. Apply warm, moist towels to your breasts for 5-10 minutes before feeding. Alternatively, a hot shower right before breastfeeding can provide the moist heat that can encourage milk flow. Gentle massage of the sore area before and during a feeding may also help. Avoid wearing tight clothing or bras that put pressure on your breasts. Wear bras that offer good support to your breasts, but avoid underwire bras. If you have a plugged milk duct and develop a fever, you need to see your health care provider.  Challenge--Mastitis  Mastitis is inflammation of your breast. It usually is caused by a bacterial infection and can cause flu-like symptoms. You may develop redness in your breast and a  fever. Often when mastitis occurs, your breast becomes firm, warm, and very painful. The most common causes of mastitis are poor latching, ineffective sucking from your baby, consistent pressure on your breast (possibly from wearing a tight-fitting bra or shirt that restricts the milk flow), unusual stress or fatigue, or missed feedings.  Solution  You will be given antibiotic medicine to treat the infection. It is still important to breastfeed frequently to empty your breasts. Continuing to breastfeed while you recover from mastitis will not harm your baby. Make sure your baby is positioned properly during every feeding. Apply moist heat to your breasts for a few minutes before feeding to help the milk flow and to help your breasts empty more easily.  Challenge--Thrush  Thrush is a yeast infection that can form on your nipples, in your breast, or in your baby's mouth. It causes itching, soreness, burning or stabbing pain, and sometimes a rash.  Solution  You will be given a medicated ointment for your nipples, and your baby will be given a liquid medicine for his or her mouth. It is important that you and your baby are treated at the same time because thrush can be passed between you and your baby. Change disposable nursing pads often. Any bras, towels, or clothing that come in contact with infected areas of your body or your baby's body need to be washed in very hot water every day. Wash your hands and your baby's hands often. All pacifiers, bottle nipples, or toys your baby puts in his or her mouth should be boiled once a day for 20 minutes. After 1 week of treatment, discard pacifiers and bottle nipples and buy new ones. All breast pump parts that touch the milk need to be boiled for 20 minutes every day.  Challenge--Low Milk Supply  You may not be producing enough milk if your baby is not gaining the proper amount of weight. Breast milk production is based on a supply-and-demand system. Your milk supply depends  on how frequently and effectively your baby empties your breast.  Solution  The more you breastfeed and pump, the more breast milk you will produce. It is important that your baby empties at least one of your breasts at each feeding. If this is not happening, then use a breast pump or hand express any milk that remains. This will help to drain as much milk as possible at each feeding. It will also signal your body to produce more   milk. If your baby is not emptying your breasts, it may be due to latching, sucking, or positioning problems. If low milk supply continues after addressing these issues, contact your health care provider or a lactation specialist as soon as possible.  Challenge--Inverted or Flat Nipples  Some women have nipples that turn inward instead of protruding outward. Other women have nipples that are flat. Inverted or flat nipples can sometimes make it more difficult for your baby to latch onto your breast.  Solution  You may be given a small device that pulls out inverted nipples. This device should be applied right before your baby is brought to your breast. You can also try using a breast pump for a short time before placing the baby at your breast. The pump can pull your nipple outwards to help your infant latch more easily. The baby's sucking motion will help the inverted nipple protrude as well.  If you have flat nipples, encourage your baby to latch onto your breast and feed frequently in the early days after birth. This will give your baby practice latching on correctly while your breast is still soft. When your milk supply increases, between the second and fifth day after birth and your breasts become full, your baby will have an easier time latching.  Contact a lactation consultant if you still have concerns. She or he can teach you additional techniques to address breastfeeding problems related to nipple shape and position.  Where to find more information:  [employer]:  www.llli.org  This information is not intended to replace advice given to you by your health care provider. Make sure you discuss any questions you have with your health care provider.  Document Released: 10/03/2005 Document Revised: 09/23/2015 Document Reviewed: 10/05/2012  Elsevier Interactive Patient Education © 2017 [employer]

## 2017-02-27 NOTE — Discharge Summary (Signed)
Obstetric Discharge Summary Reason for Admission: rupture of membranes (PPROM) Prenatal Procedures: NST, ultrasound and 17--OHP injections weekly Intrapartum Procedures: spontaneous vaginal delivery Postpartum Procedures: none Complications-Operative and Postpartum: none   Hemoglobin  Date Value Ref Range Status  02/26/2017 10.7 (L) 12.0 - 16.0 g/dL Final   HCT  Date Value Ref Range Status  02/26/2017 34.0 (L) 35.0 - 47.0 % Final    Physical Exam:  Vitals:   02/26/17 1915 02/26/17 2348 02/27/17 0813 02/27/17 1217  BP: 129/84 117/70 112/65   Pulse: 79 64 70   Resp: 18 18 18    Temp: 98.2 F (36.8 C) 97.7 F (36.5 C) 98 F (36.7 C) 98.7 F (37.1 C)  TempSrc: Oral Oral Oral Oral  SpO2: 99% 98% 97%   Weight:      Height:       General: alert and no distress Lochia: appropriate Uterine Fundus: firm Incision: none DVT Evaluation: No evidence of DVT seen on physical exam. Negative Homan's sign.  No cords or calf tenderness. No significant calf/ankle edema.  Discharge Diagnoses: PPROM at 35.[redacted] weeks gestation  Discharge Information: Date: 02/27/2017 Activity: pelvic rest Diet: routine Medications: PNV and Ibuprofen Condition: stable Instructions: refer to practice specific booklet Discharge to: home Follow-up Information    Alexa Hunter, Alexa Simmering, MD Follow up in 6 week(s).   Specialties:  Obstetrics and Gynecology, Radiology Why:  Postpartum visit Contact information: [address] [place] [id number] [phone]           Newborn Data: Live born female  Birth Weight: 4 lb 8 oz (2040 g) APGAR: 8, 9  Newborn Delivery   Birth date/time:  02/25/2017 21:37:00 Delivery type:  Vaginal, Spontaneous     Home with mother.  Alexa Hunter 02/27/2017, 12:27 PM

## 2017-02-27 NOTE — Progress Notes (Signed)
Pt discharged with infant.  Discharge instructions, prescriptions and follow up appointment given to and reviewed with pt. Pt verbalized understanding. Escorted out by staff. 

## 2017-02-27 NOTE — Progress Notes (Signed)
CPR and Period of Purple Cry video viewed by pt and significant other.  All questions answered. Teaching provided.

## 2017-02-28 LAB — SURGICAL PATHOLOGY

## 2017-03-01 ENCOUNTER — Other Ambulatory Visit: Payer: Self-pay | Admitting: Obstetrics and Gynecology

## 2017-03-02 ENCOUNTER — Ambulatory Visit: Payer: Managed Care, Other (non HMO)

## 2017-03-09 ENCOUNTER — Ambulatory Visit: Payer: Managed Care, Other (non HMO)

## 2017-04-07 ENCOUNTER — Encounter: Payer: Self-pay | Admitting: Obstetrics and Gynecology

## 2017-04-07 ENCOUNTER — Ambulatory Visit (INDEPENDENT_AMBULATORY_CARE_PROVIDER_SITE_OTHER): Payer: Managed Care, Other (non HMO) | Admitting: Obstetrics and Gynecology

## 2017-04-07 DIAGNOSIS — Z3200 Encounter for pregnancy test, result unknown: Secondary | ICD-10-CM

## 2017-04-07 DIAGNOSIS — Z3009 Encounter for other general counseling and advice on contraception: Secondary | ICD-10-CM

## 2017-04-07 DIAGNOSIS — O9081 Anemia of the puerperium: Secondary | ICD-10-CM

## 2017-04-07 LAB — POCT URINE PREGNANCY: Preg Test, Ur: NEGATIVE

## 2017-04-07 NOTE — Progress Notes (Signed)
   OBSTETRICS POSTPARTUM CLINIC PROGRESS NOTE  Subjective:     Alexa Hunter is a 31 y.o. [phone] female who presents for a postpartum visit. She is 6 weeks postpartum following a spontaneous vaginal delivery. I have fully reviewed the prenatal and intrapartum course. The delivery was at 35 gestational weeks.  Anesthesia: epidural. Postpartum course has been well. Baby's course has been well. Baby is feeding by bottle - Similac Advance. Bleeding: patient has resumed menses,  with LMP . Bowel function is normal. Bladder function is normal. Patient is sexually active. Contraception method desired is unsure (patient notes that her partner has mentioned vasectomy but does not seem to be ready to proceed just yet with procedure). Patient thinking of interval BTL. Postpartum depression screening: negative.  The following portions of the patient's history were reviewed and updated as appropriate: allergies, current medications, past family history, past medical history, past social history, past surgical history and problem list.  Review of Systems A comprehensive review of systems was negative except for: Genitourinary: positive for abnormal menstrual periods (patient notes an episode of bleeding ~ 4 weeks postpartum, followed by a week of spotting, then another round of lighter bleeding). Concerned because she noted 2 positive pregnancy tests last week (faint line), followed by a negative test.   Objective:    BP 98/66 (BP Location: Right Arm, Patient Position: Sitting, Cuff Size: Large)   Pulse 89   Ht 5\' 4"  (1.626 m)   Wt 216 lb 9.6 oz (98.2 kg)   BMI 37.18 kg/m   General:  alert and no distress   Breasts:  inspection negative, no nipple discharge or bleeding, no masses or nodularity palpable  Lungs: clear to auscultation bilaterally  Heart:  regular rate and rhythm, S1, S2 normal, no murmur, click, rub or gallop  Abdomen: soft, non-tender; bowel sounds normal; no masses,  no  organomegaly.     Vulva:  normal  Vagina: normal vagina, no discharge, exudate, lesion, or erythema  Cervix:  no cervical motion tenderness and no lesions  Corpus: normal size, contour, position, consistency, mobility, non-tender  Adnexa:  normal adnexa and no mass, fullness, tenderness  Rectal Exam: Not performed.         Labs:  Lab Results  Component Value Date   HGB 10.7 (L) 02/26/2017    Results for orders placed or performed in visit on 04/07/17  POCT urine pregnancy  Result Value Ref Range   Preg Test, Ur Negative Negative     Assessment:   Routine postpartum exam.   Possible pregnancy Contraception counseling Mild anemia postpartum  Plan:    1. Contraception: Reviewed all forms of birth control options available including abstinence; over the counter/barrier methods; hormonal contraceptive medication including pill, patch, ring, injection,contraceptive implant; hormonal and nonhormonal IUDs; permanent sterilization options including vasectomy and the various tubal sterilization modalities. Risks and benefits reviewed.  Questions were answered.  Information was given to patient to review. UPT today in office negative, but will also order bHCG in blood work to confirm due to prior history of ambiguous testing. Patient ok to try NuvaRing for now until she can decide on more permanent option (initially was prepared for BTL but then began asking questions regarding reversal if desired). To notify MD once decision is made. 2. Mild anemia postpartum not requiring treatment. Asymptomatic. No further f/u needed.  3. Follow up in: 4-6 months for annual exam, or sooner as needed.    Hildred Laserherry, Batina Dougan, MD [facility]

## 2017-04-08 ENCOUNTER — Encounter: Payer: Self-pay | Admitting: Obstetrics and Gynecology

## 2017-04-08 LAB — BETA HCG QUANT (REF LAB): hCG Quant: <1 m[IU]/mL

## 2017-04-12 ENCOUNTER — Telehealth: Payer: Self-pay | Admitting: Obstetrics and Gynecology

## 2017-04-12 NOTE — Telephone Encounter (Signed)
Patient called stating she would like to have her tubes tied. Have you discussed this with her or does she need an appointment to do so. Thanks

## 2017-04-17 ENCOUNTER — Encounter: Payer: Managed Care, Other (non HMO) | Admitting: Obstetrics and Gynecology

## 2017-04-17 NOTE — Telephone Encounter (Signed)
I did discuss this with her.  She needs to consider which date she would like based on our OR surgery schedule, and then she can be brought in for a pre-op appointment.

## 2017-04-20 ENCOUNTER — Telehealth: Payer: Self-pay | Admitting: Obstetrics and Gynecology

## 2017-04-20 NOTE — Telephone Encounter (Signed)
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 You saw patient on 04/07/17.

## 2017-04-20 NOTE — Telephone Encounter (Signed)
Patient called requesting a note to return to work on 04/26/17. She needs it faxed to [facility] fax # [phone].

## 2017-04-20 NOTE — Telephone Encounter (Signed)
It depends on how long her employer was going to let her off for maternity leave.  She was at 6 weeks postpartum at that time.  If her job allows for 8-12 weeks, then sure.  Otherwise, I cannot write a note excusing her for the additional 2 weeks she has used without having a valid medical condition that would have prevented her from returning to work sooner.

## 2017-04-20 NOTE — Telephone Encounter (Signed)
Patient called and states the fax number provided was the correct number. Please try and fax again Thank you

## 2017-04-20 NOTE — Telephone Encounter (Signed)
Serveral attempts to fax letter to fax number provided all has failed. Called patient and she states she will check and see if there is another fax number. Will await her call back

## 2017-04-20 NOTE — Telephone Encounter (Signed)
Ok. Thanks!

## 2017-04-20 NOTE — Telephone Encounter (Addendum)
A.C spoke with pt and she states she was approved for 12 weeks but would like to return after 8 weeks. This is a Financial plannerfyi. Letter done and  faxed to her employer per pt.

## 2017-04-20 NOTE — Telephone Encounter (Signed)
Tried another fax machine and received a successful confirmation. Spoke with pt and she was made aware. Nothing further is needed.

## 2017-05-03 ENCOUNTER — Telehealth: Payer: Self-pay | Admitting: Obstetrics and Gynecology

## 2017-05-03 MED ORDER — ETONOGESTREL-ETHINYL ESTRADIOL 0.12-0.015 MG/24HR VA RING: VAGINAL_RING | VAGINAL | 6 refills | Status: DC

## 2017-05-03 NOTE — Telephone Encounter (Signed)
vm full .  Will try later .

## 2017-05-03 NOTE — Telephone Encounter (Signed)
Pt aware nuvaring erx.

## 2017-05-03 NOTE — Telephone Encounter (Signed)
Patient called stating she wants to hold off on getting her tubes tied until she gets more vacation time. She will need a script for nuvaring sent to the [facility] [address]

## 2017-06-06 DIAGNOSIS — E559 Vitamin D deficiency, unspecified: Secondary | ICD-10-CM | POA: Insufficient documentation

## 2017-06-07 DIAGNOSIS — D509 Iron deficiency anemia, unspecified: Secondary | ICD-10-CM | POA: Insufficient documentation

## 2017-07-31 ENCOUNTER — Ambulatory Visit: Payer: Managed Care, Other (non HMO) | Admitting: Family Medicine

## 2018-01-09 ENCOUNTER — Encounter: Payer: Self-pay | Admitting: Family Medicine

## 2018-01-09 ENCOUNTER — Ambulatory Visit (INDEPENDENT_AMBULATORY_CARE_PROVIDER_SITE_OTHER): Payer: Managed Care, Other (non HMO) | Admitting: Family Medicine

## 2018-01-09 VITALS — BP 122/60 | HR 71 | Temp 98.1°F | Wt 217.0 lb

## 2018-01-09 DIAGNOSIS — J069 Acute upper respiratory infection, unspecified: Secondary | ICD-10-CM

## 2018-01-09 DIAGNOSIS — R6889 Other general symptoms and signs: Secondary | ICD-10-CM

## 2018-01-09 LAB — POCT INFLUENZA A/B
INFLUENZA B, POC: NEGATIVE
Influenza A, POC: NEGATIVE

## 2018-01-09 NOTE — Patient Instructions (Addendum)
Recommend nasal saline to help clear out the nose.  Okay to use the Mucinex. Recommend using a humidifier at night.   Drinks lot of fluids.   Call if you feel like you are getting worse, develop a fever, or if you are just not better in 1 week.

## 2018-01-09 NOTE — Progress Notes (Signed)
   Subjective:    Patient ID: Alexa Hunter, female    DOB: 01-03-1986, 32 y.o.   MRN: [id number]  HPI 32 year old female comes in today complaining of not feeling well nasal congestion, runny nose, postnasal drip, sore throat, body aches and feeling hot and cold that started yesterday.  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  She did check her temperature yesterday and says it was normal even though she was feeling like she was freezing and had to put some layers on at work.  Her son who is 7310 months old has also been sick for about a week and was told that it was likely a virus.  Has been taking some Mucinex.  She has not had a significant cough.   Review of Systems     Objective:   Physical Exam  Constitutional: She is oriented to person, place, and time. She appears well-developed and well-nourished.  HENT:  Head: Normocephalic and atraumatic.  Right Ear: External ear normal.  Left Ear: External ear normal.  Nose: Nose normal.  Mouth/Throat: Oropharynx is clear and moist.  TMs and canals are clear.   Eyes: Pupils are equal, round, and reactive to light. Conjunctivae and EOM are normal.  Neck: Neck supple. No thyromegaly present.  Cardiovascular: Normal rate, regular rhythm and normal heart sounds.  Pulmonary/Chest: Effort normal and breath sounds normal. She has no wheezes.  Lymphadenopathy:    She has no cervical adenopathy.  Neurological: She is alert and oriented to person, place, and time.  Skin: Skin is warm and dry.  Psychiatric: She has a normal mood and affect.          Assessment & Plan:  URI- Think she should be out of work for at least the next 48 hours to give her time to rest and recover.  Commend conservative treatment.  Continue with Mucinex day and nighttime.  If she feels like she is getting worse or not feeling better by the end of the  week then please let us know.  In the meantime continue with hydration.

## 2018-05-30 ENCOUNTER — Emergency Department: Payer: Managed Care, Other (non HMO)

## 2018-05-30 ENCOUNTER — Encounter: Payer: Self-pay | Admitting: Emergency Medicine

## 2018-05-30 ENCOUNTER — Emergency Department
Admission: EM | Admit: 2018-05-30 | Discharge: 2018-05-30 | Disposition: A | Discharge status: Home / Self Care | Payer: Managed Care, Other (non HMO) | Attending: Emergency Medicine | Admitting: Emergency Medicine

## 2018-05-30 ENCOUNTER — Other Ambulatory Visit: Payer: Self-pay

## 2018-05-30 DIAGNOSIS — J45909 Unspecified asthma, uncomplicated: Secondary | ICD-10-CM | POA: Insufficient documentation

## 2018-05-30 DIAGNOSIS — J111 Influenza due to unidentified influenza virus with other respiratory manifestations: Secondary | ICD-10-CM | POA: Diagnosis not present

## 2018-05-30 DIAGNOSIS — Z79899 Other long term (current) drug therapy: Secondary | ICD-10-CM | POA: Diagnosis not present

## 2018-05-30 DIAGNOSIS — R69 Illness, unspecified: Secondary | ICD-10-CM

## 2018-05-30 DIAGNOSIS — R05 Cough: Secondary | ICD-10-CM | POA: Diagnosis present

## 2018-05-30 MED ORDER — OSELTAMIVIR PHOSPHATE 75 MG PO CAPS: 75.0000 mg | ORAL_CAPSULE | Freq: Two times a day (BID) | ORAL | 0 refills | Status: AC

## 2018-05-30 MED ORDER — PSEUDOEPH-BROMPHEN-DM 30-2-10 MG/5ML PO SYRP: 5.0000 mL | ORAL_SOLUTION | Freq: Four times a day (QID) | ORAL | 0 refills | Status: DC | PRN

## 2018-05-30 NOTE — ED Provider Notes (Signed)
[facility] Emergency Department Provider Note   ____________________________________________   First MD Initiated Contact with Patient 05/30/18 [phone]     (approximate)  I have reviewed the triage vital signs and the nursing notes.   HISTORY  Chief Complaint Cough    HPI Alexa Hunter is a 33 y.o. female   patient presents with cough congestion for more than a week.  Patient say worsened this morning.  Patient denies fever/chills.  Patient states sore shoulder on the right side.  Patient denies nausea, vomiting, diarrhea.  Patient is taken flu shot for this season.  It is noted her child safe and was positive for influenza A.  Child is also taking a flu shot.   Past Medical History:  Diagnosis Date  . Abnormal Pap smear of cervix   . Asthma   . Contact dermatitis     Patient Active Problem List   Diagnosis Date Noted  . Preterm premature rupture of membranes 02/25/2017  . Pregnancy affected by fetal growth restriction 02/22/2017  . Labor and delivery, indication for care 02/20/2017  . History of preterm delivery, currently pregnant in third trimester 02/20/2017  . Adnexal cyst 11/24/2015  . History of depression 05/13/2015  . Plantar fasciitis, right 03/14/2014  . History of abnormal cervical Pap smear 05/21/2013  . Eczema 12/10/2010  . HYPERLIPIDEMIA 03/23/2010  . OBESITY 03/23/2010  . MIGRAINE HEADACHE 08/26/2008  . 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 RHINITIS, ALLERGIC NOS 03/17/2006  . ASTHMA 03/17/2006    Past Surgical History:  Procedure Laterality Date  . CHOLECYSTECTOMY  2013  . 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 none      Prior to Admission medications   Medication Sig Start Date End Date Taking? Authorizing Provider  omeprazole (PRILOSEC) 20 MG capsule Take 20 mg by mouth daily. 09/28/17  Yes [provider]  brompheniramine-pseudoephedrine-DM 30-2-10 MG/5ML syrup Take 5 mLs by mouth 4 (four) times daily as  needed. 05/30/18   Joni Reining, PA-C  norethindrone-ethinyl estradiol (JUNEL FE,GILDESS FE,LOESTRIN FE) 1-20 MG-MCG tablet Take 1 tablet by mouth daily. 12/12/17   [provider]  ondansetron (ZOFRAN-ODT) 8 MG disintegrating tablet Take 1 tablet by mouth every 6 (six) hours as needed. 11/13/17   [provider]  oseltamivir (TAMIFLU) 75 MG capsule Take 1 capsule (75 mg total) by mouth 2 (two) times daily for 5 days. 05/30/18 06/04/18  Joni Reining, PA-C  Prenatal Vit-Fe Fumarate-FA (MULTIVITAMIN-PRENATAL) 27-0.8 MG TABS tablet Take 1 tablet by mouth daily at 12 noon.    [provider]    Allergies Flagyl [metronidazole]  Family History  Problem Relation Age of Onset  . Migraines Mother   . Hypertension Mother   . Diabetes Maternal Grandmother   . Diabetes Sister     Social History Social History   Tobacco Use  . Smoking status: Never Smoker  . Smokeless tobacco: Never Used  Substance Use Topics  . Alcohol use: No  . Drug use: No    Review of Systems Constitutional: No fever/chills Eyes: No visual changes. ENT: No sore throat. Cardiovascular: Denies chest pain. Respiratory: Denies shortness of breath.  Productive cough. Gastrointestinal: No abdominal pain.  No nausea, no vomiting.  No diarrhea.  No constipation. Genitourinary: Negative for dysuria. Musculoskeletal: Right shoulder pain.   Skin: Negative for rash. Neurological: Negative for headaches, focal weakness or numbness. Allergic/Immunilogical: Flagyl. ____________________________________________   PHYSICAL EXAM:  VITAL SIGNS: ED Triage Vitals [05/30/18 0616]  Enc Vitals Group  BP (!) 106/59     Pulse Rate 78     Resp 18     Temp 98.1 F (36.7 C)     Temp Source Oral     SpO2 99 %     Weight 202 lb (91.6 kg)     Height 5\' 4"  (1.626 m)     Head Circumference      Peak Flow      Pain Score 8     Pain Loc      Pain Edu?      Excl. in GC?     Constitutional: Alert and  oriented. Well appearing and in no acute distress. Eyes: Conjunctivae are normal. PERRL. EOMI. Head: Atraumatic. Nose: No congestion/rhinnorhea. Mouth/Throat: Mucous membranes are moist.  Oropharynx non-erythematous. Neck: No stridor.   Hematological/Lymphatic/Immunilogical: No cervical lymphadenopathy. Cardiovascular: Normal rate, regular rhythm. Grossly normal heart sounds.  Good peripheral circulation. Respiratory: Normal respiratory effort.  No retractions. Lungs CTAB.  Nonproductive cough with deep inspirations. Musculoskeletal: No obvious shoulder deformity.  Patient is moderate guarding palpation the superior GH joint.  Full and equal range of motion.  Neurologic:  Normal speech and language. No gross focal neurologic deficits are appreciated. No gait instability. Skin:  Skin is warm, dry and intact. No rash noted. Psychiatric: Mood and affect are normal. Speech and behavior are normal.  ____________________________________________   LABS (all labs ordered are listed, but only abnormal results are displayed)  Labs Reviewed - No data to display ____________________________________________  EKG   ____________________________________________  RADIOLOGY  ED MD interpretation:    Official radiology report(s): Dg Chest 2 View  Result Date: 05/30/2018 CLINICAL DATA:  Cough and congestion EXAM: CHEST - 2 VIEW COMPARISON:  None. FINDINGS: There is no edema or consolidation. The heart size and pulmonary vascularity are normal. No adenopathy. No bone lesions. IMPRESSION: No edema or consolidation. Electronically Signed   By: Bretta BangWilliam  Woodruff III M.D.   On: 05/30/2018 07:51    ____________________________________________   PROCEDURES  Procedure(s) performed:   Procedures  Critical Care performed: No  ____________________________________________   INITIAL IMPRESSION / ASSESSMENT AND PLAN / ED COURSE  As part of my medical decision making, I reviewed the following data  within the electronic MEDICAL RECORD NUMBER     Patient presents with cough for greater than 1 week.  Chest x-ray is unremarkable.  Patient is recent exposure to flu from her child who is in school.  Both has taken flu shot.  Patient given discharge care instruction and advised take medication as directed.      ____________________________________________   FINAL CLINICAL IMPRESSION(S) / ED DIAGNOSES  Final diagnoses:  Influenza-like illness     ED Discharge Orders         Ordered    oseltamivir (TAMIFLU) 75 MG capsule  2 times daily     05/30/18 0759    brompheniramine-pseudoephedrine-DM 30-2-10 MG/5ML syrup  4 times daily PRN     05/30/18 0759           Note:  This document was prepared using Dragon voice recognition software and may include unintentional dictation errors.    Joni ReiningSmith, Lakecia Deschamps K, PA-C 05/30/18 [id number]    Emily FilbertWilliams, Jonathan E, MD 05/30/18 [phone]

## 2018-05-30 NOTE — ED Triage Notes (Signed)
Patient to ER for c/o cough and congestion for more than a week, worsened this am.

## 2018-11-30 IMAGING — US US OB TRANSVAGINAL
1 series · 13 of 28 positions shown · non-contrast
Comparison: None.

CLINICAL DATA: 30-year-old female with positive HCG levels
presenting with vaginal bleeding. LMP: 02/26/2016 corresponding to
an estimated gestational age of 12 weeks, 2 days. History of medical
abortion with Cytotec.

EXAM:
OBSTETRIC <14 WK US AND TRANSVAGINAL OB US
TECHNIQUE: Both transabdominal and transvaginal ultrasound examinations were
performed for complete evaluation of the gestation as well as the
maternal uterus, adnexal regions, and pelvic cul-de-sac.
Transvaginal technique was performed to assess early pregnancy.

[Series 1: us ob transvaginal · 0.25mm/px · 13 of 83 slices shown]
[im 4/83]
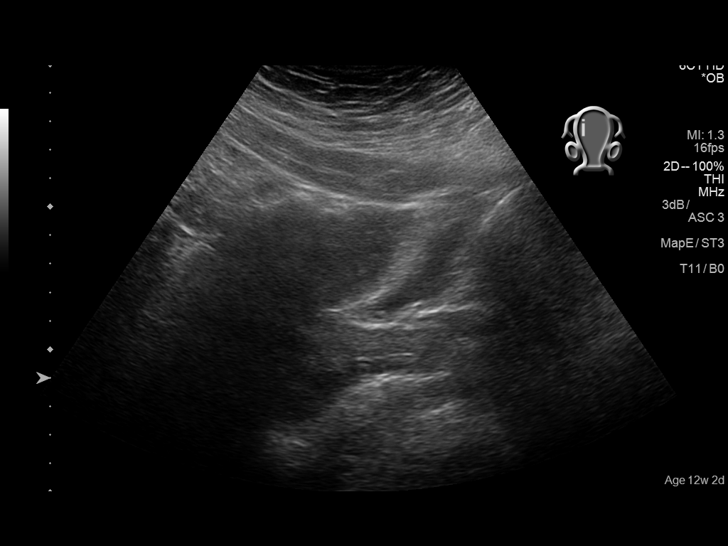
[im 10/83]
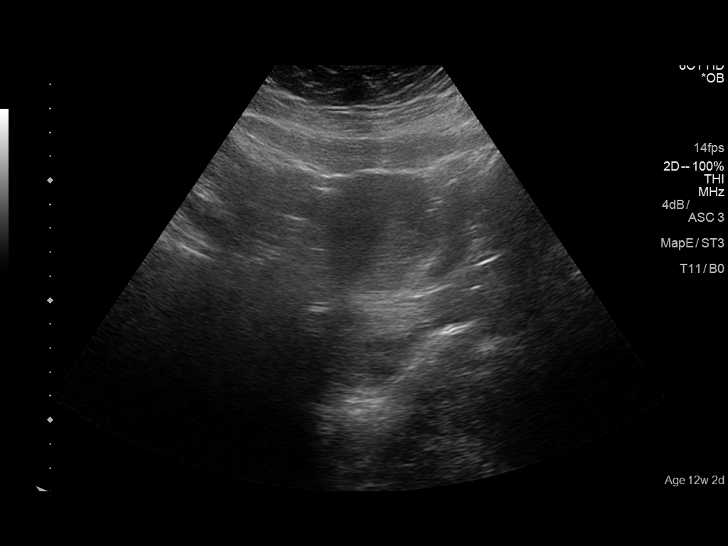
[im 16/83]
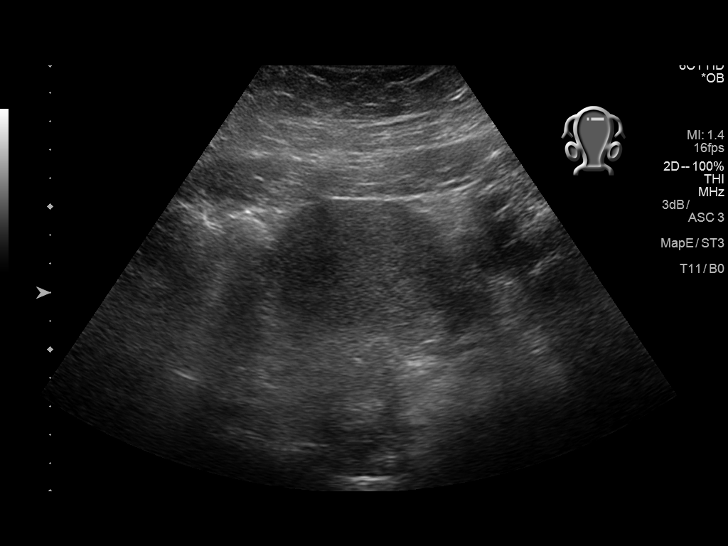
[im 22/83]
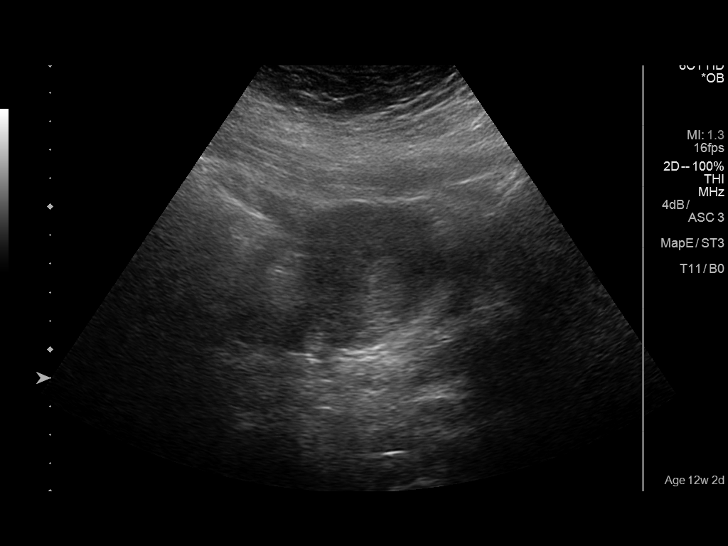
[im 28/83]
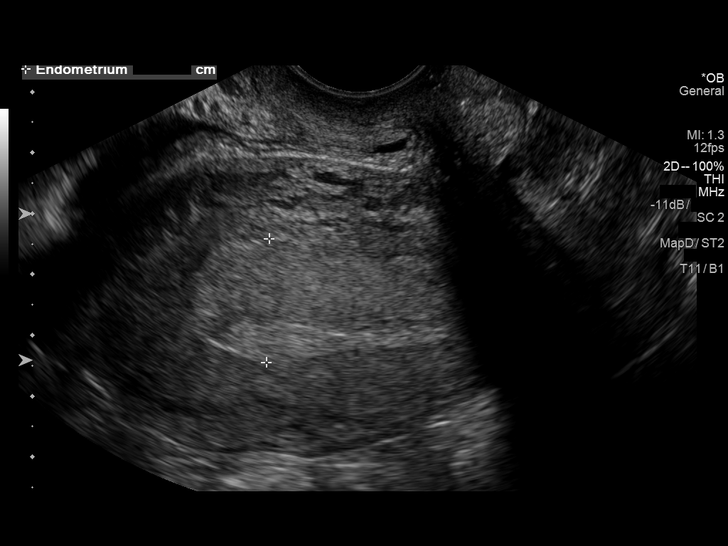
[im 34/83]
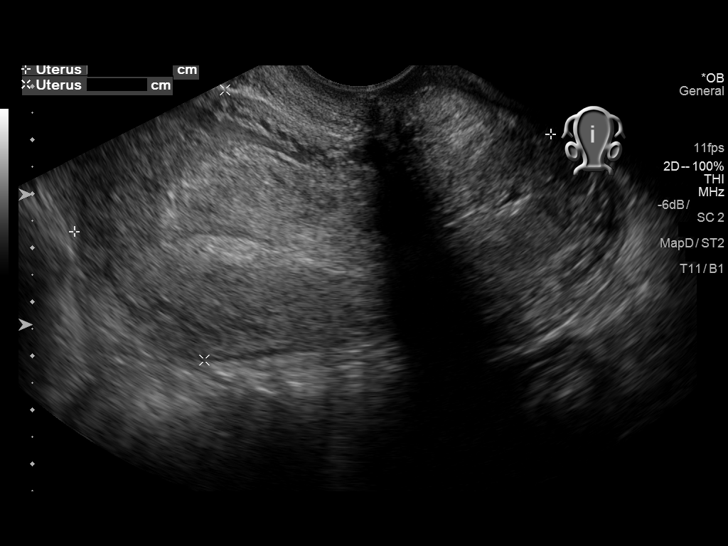
[im 43/83]
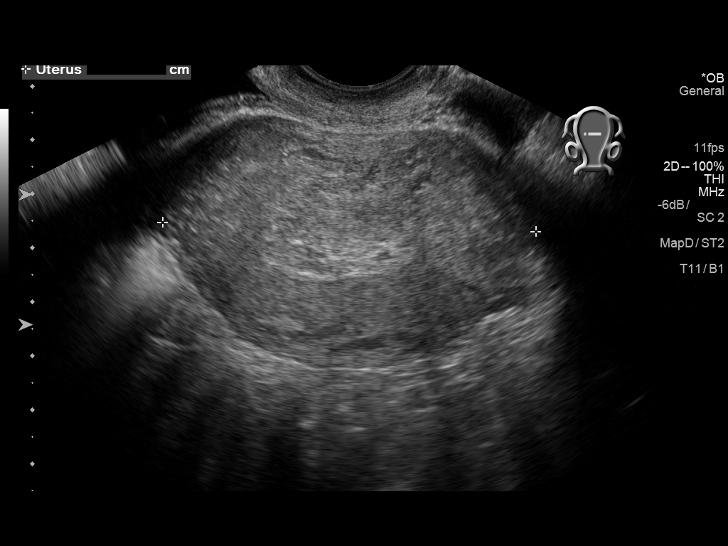
[im 49/83]
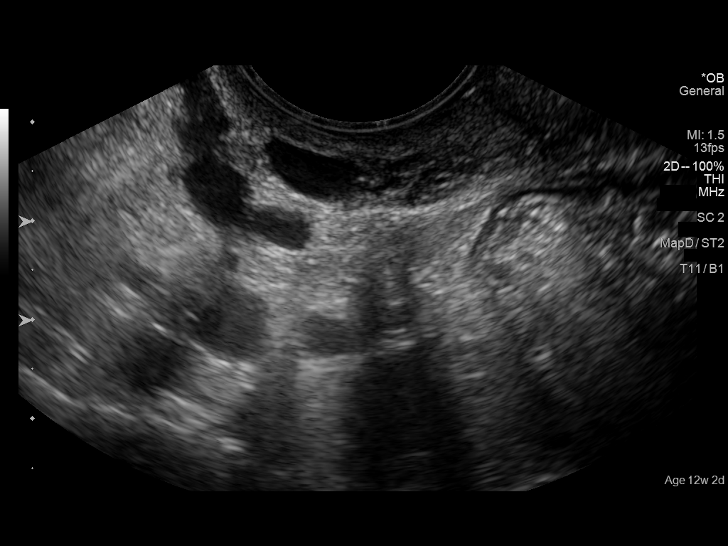
[im 55/83]
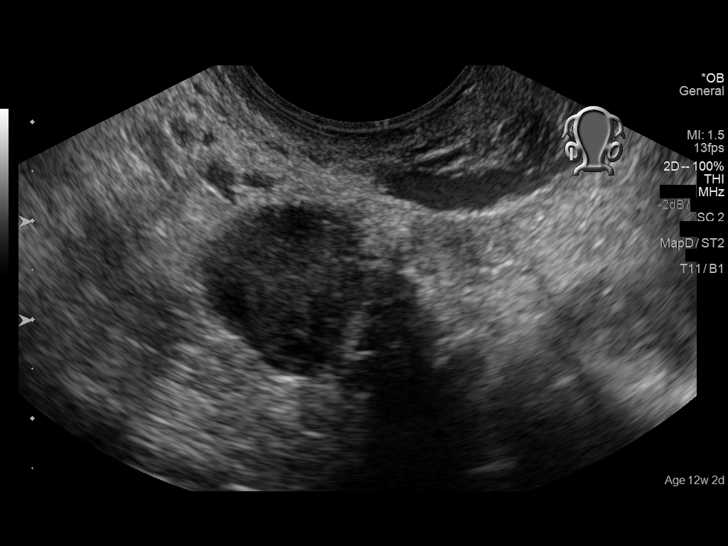
[im 61/83]
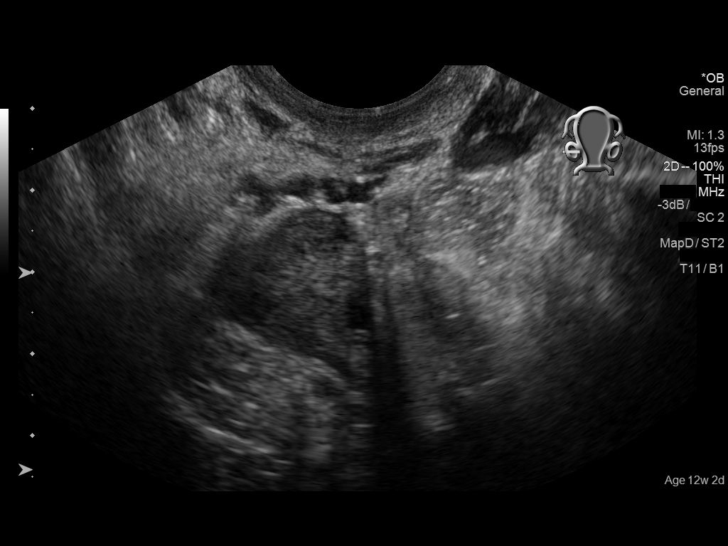
[im 67/83]
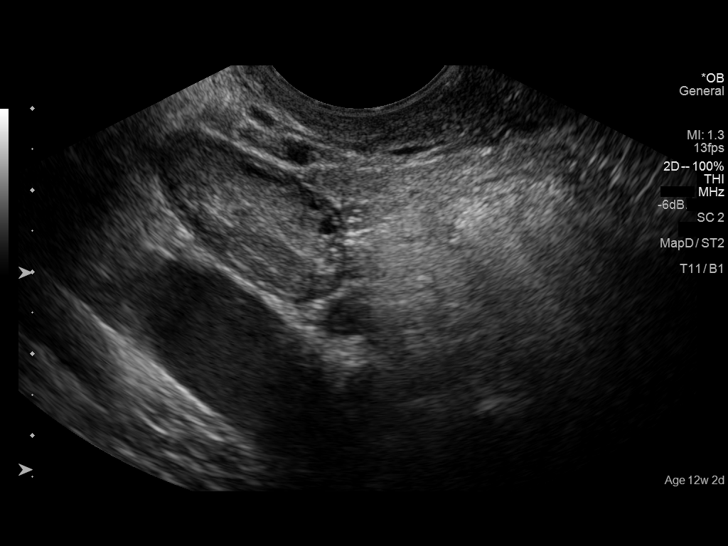
[im 73/83]
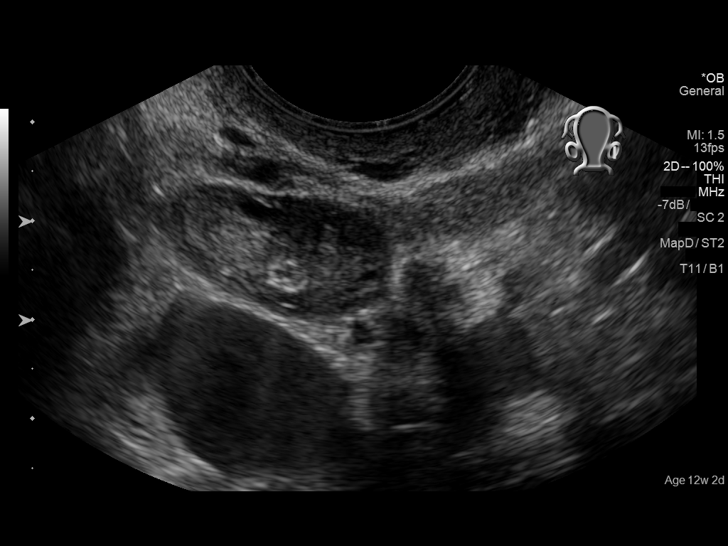
[im 79/83]
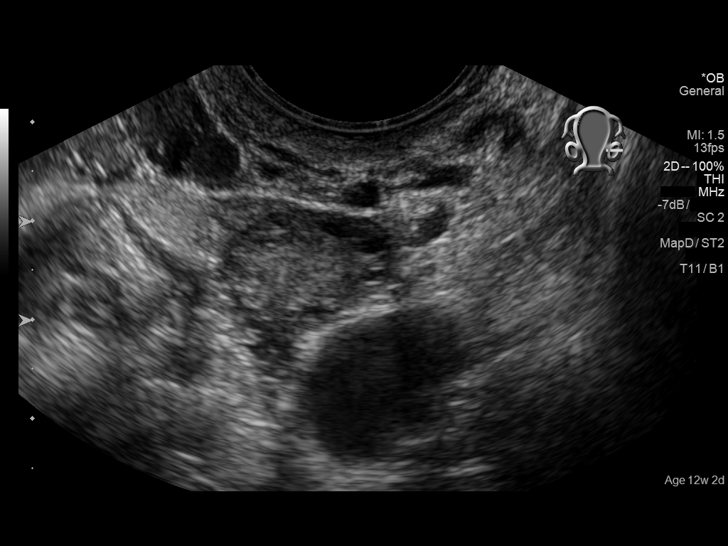

[13 of 28 positions shown; findings below may reference images not displayed]

FINDINGS: The uterus is anteverted and measures 9.3 x 5.3 x 6.7 cm. The
endometrium is thickened and echogenic measuring 2 cm in thickness.
Echogenic content with no internal vascularity within the
endometrial canal most consistent with blood products.

The right ovary measures 1.8 x 2.1 x 1.9 cm. The left ovary measures
3.0 x 1.5 x 1.9 cm. There is a 0.4 x 0.4 x 0.3 cm round echogenic
lesion in the left ovary which is indeterminate but may represent an
involuted corpus luteum. A small dermoid or other ovarian neoplasm
are not excluded. Follow-up with ultrasound in 8-12 weeks
recommended.
IMPRESSION: Findings consistent with history of abortion. Blood product noted
within the endometrial canal. No evidence of retained products of
conception.

Subcentimeter left ovarian echogenic lesion as described. Follow-up
with ultrasound in 8-12 weeks recommended.

These results were called by telephone at the time of interpretation
on 05/22/2016 at [DATE] to Dr. EDNEUDO GABRIELLE , who verbally
acknowledged these results.

## 2018-12-27 ENCOUNTER — Encounter: Payer: Self-pay | Admitting: Family Medicine

## 2018-12-27 ENCOUNTER — Ambulatory Visit (INDEPENDENT_AMBULATORY_CARE_PROVIDER_SITE_OTHER): Payer: Managed Care, Other (non HMO) | Admitting: Family Medicine

## 2018-12-27 DIAGNOSIS — F329 Major depressive disorder, single episode, unspecified: Secondary | ICD-10-CM

## 2018-12-27 DIAGNOSIS — F32A Depression, unspecified: Secondary | ICD-10-CM

## 2018-12-27 MED ORDER — ESCITALOPRAM OXALATE 10 MG PO TABS: ORAL_TABLET | ORAL | 1 refills | Status: DC

## 2018-12-27 NOTE — Assessment & Plan Note (Signed)
New diagnosis.  PHQ 9 score of 17 and the gad 7 score is 16.  No thoughts of wanting to harm herself.  We discussed treatment options including medication, therapy/counseling, and increased exercise and self-care.  We discussed finding some time for herself and making sure that she is getting adequate sleep at night.  We also discussed trying to get out and walk for exercise.  We did discuss medication including SSRIs as an option.  We will start her on Lexapro and follow-up in 3 weeks.  Did explain that it can oftentimes take several weeks for medication to start working.  We also discussed referring her to therapist/counselor she currently works for [employer] so needs to stay in network.  So we will place a referral within their health system.  Did go ahead and write her out of work through the weekend just to give her some time to regroup and spend time with her family.  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

## 2018-12-27 NOTE — Progress Notes (Signed)
Pt reports that she had a miscarriage around June and stated that her husband has noticed a change in her. She stated that the  pregnancy was not planned she had an IUD placed and was having some issues with it when she found out that she was pregnant.    She stated that the last time that she had any issues with depression was about 10 years ago when she was in a bad relationship. She wasn't prescribe any medications.Elouise Munroe, [place]

## 2018-12-27 NOTE — Progress Notes (Signed)
Virtual Visit via Telephone Note  I connected with Alexa Hunter on 12/27/18 at  2:00 PM EDT by a video enabled telemedicine application. We were unable to get the video component to work so used voice only. Verified that I am speaking with the correct person using two identifiers.   I discussed the limitations of evaluation and management by telemedicine and the availability of in person appointments. The patient expressed understanding and agreed to proceed.    Established Patient Office Visit  Subjective:  Patient ID: Alexa Hunter, female    DOB: 1985/11/28  Age: 33 y.o. MRN: [id number]  CC:  Chief Complaint  Patient presents with  . mood    HPI Alexa Hunter presents for Pt reports that she had a miscarriage around June and stated that her husband has noticed a change in her. She stated that the  pregnancy was not planned she had an IUD placed and was having some issues with it so had it removed.  Then got pregnant immediately after having it removed and then had a miscarriage at 8 weeks. She has been having crying spells and feeling more down for about the last month.  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  She just feels like this is been very emotionally taxing.  She just feels exhausted by the time she gets home and she really has not had any time off of work.  Initially she lost hours and so got financially behind and that now feels like she cannot miss work at all because she is behind in her finances.  She stated that the last time that she had any issues with depression was about 10 years ago when she was in a bad relationship. She wasn't prescribe any medications  She is also having some problems with intimacy with her husband.  She says when he tries to become intimate she just gets upset and starts crying.  She knows that it is not her husband's fault she just does not know what to do to make it better.  Past  Medical History:  Diagnosis Date  . Abnormal Pap smear of cervix   . Asthma   . Contact dermatitis     Past Surgical History:  Procedure Laterality Date  . CHOLECYSTECTOMY  2013  . 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 none      Family History  Problem Relation Age of Onset  . Migraines Mother   . Hypertension Mother   . Diabetes Maternal Grandmother   . Diabetes Sister     Social History   Socioeconomic History  . Marital status: Married    Spouse name: Not on file  . Number of children: Not on file  . Years of education: Not on file  . Highest education level: Not on file  Occupational History  . 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 Financial resource strain: Not on file  . Food insecurity    Worry: Not on file    Inability: Not on file  . Transportation needs    Medical: Not on file    Non-medical: Not on file  Tobacco Use  . Smoking status: Never Smoker  . Smokeless tobacco: Never Used  Substance and Sexual Activity  . Alcohol use: No  . Drug use: No  . Sexual activity: Yes    Birth control/protection: Pill, Condom  Lifestyle  .  Physical activity    Days per week: Not on file    Minutes per session: Not on file  . Stress: Not on file  Relationships  . Social Musicianconnections    Talks on phone: Not on file    Gets together: Not on file    Attends religious service: Not on file    Active member of club or organization: Not on file    Attends meetings of clubs or organizations: Not on file    Relationship status: Not on file  . Intimate partner violence    Fear of current or ex partner: Not on file    Emotionally abused: Not on file    Physically abused: Not on file    Forced sexual activity: Not on file  Other Topics Concern  . Not on file  Social History Narrative   Has a son.      Outpatient Medications Prior to Visit  Medication Sig Dispense Refill  . Multiple Vitamin (MULTIVITAMIN) capsule  Take by mouth.    [name]. VITAMIN D PO Vitamin D    . brompheniramine-pseudoephedrine-DM 30-2-10 MG/5ML syrup Take 5 mLs by mouth 4 (four) times daily as needed. 120 mL 0  . norethindrone-ethinyl estradiol (JUNEL FE,GILDESS FE,LOESTRIN FE) 1-20 MG-MCG tablet Take 1 tablet by mouth daily.    [name]. omeprazole (PRILOSEC) 20 MG capsule Take 20 mg by mouth daily.    . ondansetron (ZOFRAN-ODT) 8 MG disintegrating tablet Take 1 tablet by mouth every 6 (six) hours as needed.    . Prenatal Vit-Fe Fumarate-FA (MULTIVITAMIN-PRENATAL) 27-0.8 MG TABS tablet Take 1 tablet by mouth daily at 12 noon.     No facility-administered medications prior to visit.     Allergies  Allergen Reactions  . Flagyl [Metronidazole] Nausea And Vomiting    Oral flagyl only    ROS Review of Systems    Objective:    Physical Exam  Constitutional: She is oriented to person, place, and time.  Pulmonary/Chest: Effort normal.  Neurological: She is alert and oriented to person, place, and time.  Psychiatric: She has a normal mood and affect. Her behavior is normal.    LMP 12/12/2018 (Exact Date)  Wt Readings from Last 3 Encounters:  05/30/18 202 lb (91.6 kg)  01/09/18 217 lb (98.4 kg)  04/07/17 216 lb 9.6 oz (98.2 kg)     Health Maintenance Due  Topic Date Due  . PAP SMEAR-Modifier  04/28/2017    There are no preventive care reminders to display for this patient.  Lab Results  Component Value Date   TSH 0.784 11/30/2009   Lab Results  Component Value Date   WBC 16.8 (H) 02/26/2017   HGB 10.7 (L) 02/26/2017   HCT 34.0 (L) 02/26/2017   MCV 84.6 02/26/2017   PLT 195 02/26/2017   Lab Results  Component Value Date   NA 138 05/22/2016   K 4.5 05/22/2016   CO2 24 05/22/2016   GLUCOSE 91 05/22/2016   BUN 7 05/22/2016   CREATININE 0.66 05/22/2016   BILITOT 1.1 05/22/2016   ALKPHOS 58 05/22/2016   AST 35 05/22/2016   ALT 14 05/22/2016   PROT 7.2 05/22/2016   ALBUMIN 3.5 05/22/2016   CALCIUM 8.4 (L)  05/22/2016   ANIONGAP 8 05/22/2016   Lab Results  Component Value Date   CHOL 234 (H) 11/30/2009   Lab Results  Component Value Date   HDL 53 11/30/2009   Lab Results  Component Value Date   LDLCALC 160 (H) 11/30/2009  Lab Results  Component Value Date   TRIG 104 11/30/2009   Lab Results  Component Value Date   CHOLHDL 4.4 Ratio 11/30/2009   No results found for: HGBA1C    Assessment & Plan:   Problem List Items Addressed This Visit      Other   Acute depression - Primary    New diagnosis.  PHQ 9 score of 17 and the gad 7 score is 16.  No thoughts of wanting to harm herself.  We discussed treatment options including medication, therapy/counseling, and increased exercise and self-care.  We discussed finding some time for herself and making sure that she is getting adequate sleep at night.  We also discussed trying to get out and walk for exercise.  We did discuss medication including SSRIs as an option.  We will start her on Lexapro and follow-up in 3 weeks.  Did explain that it can oftentimes take several weeks for medication to start working.  We also discussed referring her to therapist/counselor she currently works for [employer] so needs to stay in network.  So we will place a referral within their health system.  Did go ahead and write her out of work through the weekend just to give her some time to regroup and spend time with her family.  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      Relevant Medications   escitalopram (LEXAPRO) 10 MG tablet   Other Relevant Orders   Ambulatory referral to Behavioral Health     PHQ 9 score of 17 and the gad 7 score is 16.  No thoughts of wanting to harm herself.   Meds ordered this encounter  Medications  . escitalopram (LEXAPRO) 10 MG tablet    Sig: 1/2 tab po QD x 6 days, then whole tab daily    Dispense:   30 tablet    Refill:  1    Follow-up: Return in about 3 weeks (around 01/17/2019) for Mood.     I discussed the assessment and treatment plan with the patient. The patient was provided an opportunity to ask questions and all were answered. The patient agreed with the plan and demonstrated an understanding of the instructions.   The patient was advised to call back or seek an in-person evaluation if the symptoms worsen or if the condition fails to improve as anticipated.  Time spent 20 min in non face to face encounter.   Nani Gasser, MD

## 2019-03-07 ENCOUNTER — Encounter: Payer: Self-pay | Admitting: Family Medicine

## 2019-03-10 ENCOUNTER — Emergency Department: Payer: Managed Care, Other (non HMO)

## 2019-03-10 ENCOUNTER — Other Ambulatory Visit: Payer: Self-pay

## 2019-03-10 ENCOUNTER — Emergency Department
Admission: EM | Admit: 2019-03-10 | Discharge: 2019-03-10 | Disposition: A | Discharge status: Home / Self Care | Payer: Managed Care, Other (non HMO) | Attending: Emergency Medicine | Admitting: Emergency Medicine

## 2019-03-10 DIAGNOSIS — N939 Abnormal uterine and vaginal bleeding, unspecified: Secondary | ICD-10-CM

## 2019-03-10 DIAGNOSIS — O208 Other hemorrhage in early pregnancy: Secondary | ICD-10-CM | POA: Insufficient documentation

## 2019-03-10 DIAGNOSIS — J45909 Unspecified asthma, uncomplicated: Secondary | ICD-10-CM | POA: Insufficient documentation

## 2019-03-10 DIAGNOSIS — Z3A01 Less than 8 weeks gestation of pregnancy: Secondary | ICD-10-CM | POA: Diagnosis not present

## 2019-03-10 LAB — CBC
HCT: 35.2 % — ABNORMAL LOW (ref 36.0–46.0)
Hemoglobin: 11.3 g/dL — ABNORMAL LOW (ref 12.0–15.0)
MCH: 27 pg (ref 26.0–34.0)
MCHC: 32.1 g/dL (ref 30.0–36.0)
MCV: 84 fL (ref 80.0–100.0)
Platelets: 287 10*3/uL (ref 150–400)
RBC: 4.19 MIL/uL (ref 3.87–5.11)
RDW: 14.5 % (ref 11.5–15.5)
WBC: 11.5 10*3/uL — ABNORMAL HIGH (ref 4.0–10.5)
nRBC: 0 % (ref 0.0–0.2)

## 2019-03-10 LAB — BASIC METABOLIC PANEL
Anion gap: 8 (ref 5–15)
BUN: 8 mg/dL (ref 6–20)
CO2: 25 mmol/L (ref 22–32)
Calcium: 8.5 mg/dL — ABNORMAL LOW (ref 8.9–10.3)
Chloride: 105 mmol/L (ref 98–111)
Creatinine, Ser: 0.68 mg/dL (ref 0.44–1.00)
GFR calc Af Amer: >60 mL/min (ref >60)
GFR calc non Af Amer: >60 mL/min (ref >60)
Glucose, Bld: 86 mg/dL (ref 70–99)
Potassium: 4.1 mmol/L (ref 3.5–5.1)
Sodium: 138 mmol/L (ref 135–145)

## 2019-03-10 LAB — HCG, QUANTITATIVE, PREGNANCY: hCG, Beta Chain, Quant, S: 6449 m[IU]/mL — ABNORMAL HIGH (ref <5)

## 2019-03-10 NOTE — ED Provider Notes (Signed)
[facility] Emergency Department Provider Note  ____________________________________________  Time seen: Approximately 7:55 PM  I have reviewed the triage vital signs and the nursing notes.   HISTORY  Chief Complaint Vaginal Bleeding    HPI Alexa Hunter is a 33 y.o. female presents to the emergency department with vaginal bleeding.  Patient had some light vaginal bleeding earlier in the day but states that vaginal bleeding is heavier tonight and she has noticed some clots. Patient reports that she is approximately [redacted] weeks pregnant.  She has a history of multiple miscarriages in the past.  She denies abdominal pain, nausea or vomiting.  No dysuria or increased urinary frequency. No fever at home.  No changes in vaginal discharge.        Past Medical History:  Diagnosis Date  . Abnormal Pap smear of cervix   . Asthma   . Contact dermatitis     Patient Active Problem List   Diagnosis Date Noted  . Acute depression 12/27/2018  . Iron deficiency anemia 06/07/2017  . Vitamin D deficiency 06/06/2017  . Preterm premature rupture of membranes 02/25/2017  . Pregnancy affected by fetal growth restriction 02/22/2017  . Labor and delivery, indication for care 02/20/2017  . History of preterm delivery, currently pregnant in third trimester 02/20/2017  . Abnormal glucose tolerance in pregnancy 12/12/2016  . 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 History of miscarriage 08/10/2016  . History of preterm delivery 08/10/2016  . 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 Adnexal cyst 11/24/2015  . Mild intermittent asthma without complication 01/60/1093  . History of depression 05/13/2015  . Plantar fasciitis, right 03/14/2014  . History of abnormal cervical Pap smear 05/21/2013  . Eczema 12/10/2010  . HYPERLIPIDEMIA 03/23/2010  . OBESITY 03/23/2010  . MIGRAINE HEADACHE 08/26/2008  . 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 RHINITIS, ALLERGIC NOS 03/17/2006   . ASTHMA 03/17/2006  . Asthma 03/17/2006    Past Surgical History:  Procedure Laterality Date  . CHOLECYSTECTOMY  2013  . 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 none      Prior to Admission medications   Medication Sig Start Date End Date Taking? Authorizing Provider  escitalopram (LEXAPRO) 10 MG tablet 1/2 tab po QD x 6 days, then whole tab daily 12/27/18  Yes Hali Marry, MD  Multiple Vitamin (MULTIVITAMIN) capsule Take by mouth.    [provider]  VITAMIN D PO Vitamin D    [provider]    Allergies Flagyl [metronidazole]  Family History  Problem Relation Age of Onset  . Migraines Mother   . Hypertension Mother   . Diabetes Maternal Grandmother   . Diabetes Sister     Social History Social History   Tobacco Use  . Smoking status: Never Smoker  . Smokeless tobacco: Never Used  Substance Use Topics  . Alcohol use: No  . Drug use: No     Review of Systems  Constitutional: No fever/chills Eyes: No visual changes. No discharge ENT: No upper respiratory complaints. Cardiovascular: no chest pain. Respiratory: no cough. No SOB. Gastrointestinal: No abdominal pain.  No nausea, no vomiting.  No diarrhea.  No constipation. Genitourinary: Negative for dysuria. No hematuria. Patient has vaginal bleeding.  Musculoskeletal: Negative for musculoskeletal pain. Skin: Negative for rash, abrasions, lacerations, ecchymosis. Neurological: Negative for headaches, focal weakness or numbness.  ____________________________________________   PHYSICAL EXAM:  VITAL SIGNS: ED Triage Vitals  Enc Vitals Group     BP 03/10/19 1519 (!) 93/59     Pulse Rate  03/10/19 1519 67     Resp --      Temp 03/10/19 1519 98.6 F (37 C)     Temp Source 03/10/19 1519 Oral     SpO2 03/10/19 1519 99 %     Weight 03/10/19 1520 209 lb (94.8 kg)     Height 03/10/19 1520 5\' 4"  (1.626 m)     Head Circumference --      Peak Flow --      Pain Score 03/10/19 1521 8     Pain Loc  --      Pain Edu? --      Excl. in GC? --      Constitutional: Alert and oriented. Well appearing and in no acute distress. Eyes: Conjunctivae are normal. PERRL. EOMI. Head: Atraumatic. ENT: Cardiovascular: Normal rate, regular rhythm. Normal S1 and S2.  Good peripheral circulation. Respiratory: Normal respiratory effort without tachypnea or retractions. Lungs CTAB. Good air entry to the bases with no decreased or absent breath sounds. Gastrointestinal: Bowel sounds 4 quadrants. Soft and nontender to palpation. No guarding or rigidity. No palpable masses. No distention. No CVA tenderness. Musculoskeletal: Full range of motion to all extremities. No gross deformities appreciated. Neurologic:  Normal speech and language. No gross focal neurologic deficits are appreciated.  Skin:  Skin is warm, dry and intact. No rash noted. Psychiatric: Mood and affect are normal. Speech and behavior are normal. Patient exhibits appropriate insight and judgement.   ____________________________________________   LABS (all labs ordered are listed, but only abnormal results are displayed)  Labs Reviewed  CBC - Abnormal; Notable for the following components:      Result Value   WBC 11.5 (*)    Hemoglobin 11.3 (*)    HCT 35.2 (*)    All other components within normal limits  BASIC METABOLIC PANEL - Abnormal; Notable for the following components:   Calcium 8.5 (*)    All other components within normal limits  HCG, QUANTITATIVE, PREGNANCY - Abnormal; Notable for the following components:   hCG, Beta Chain, Quant, S 6,449 (*)    All other components within normal limits   ____________________________________________  EKG   ____________________________________________  RADIOLOGY I personally viewed and evaluated these images as part of my medical decision making, as well as reviewing the written report by the radiologist.  [place] Ob Comp Less 14 Wks  Result Date: 03/10/2019 CLINICAL DATA:   Vaginal bleeding EXAM: OBSTETRIC <14 WK US AND TRANSVAGINAL OB US TECHNIQUE: Both transabdominal and transvaginal ultrasound examinations were performed for complete evaluation of the gestation as well as the maternal uterus, adnexal regions, and pelvic cul-de-sac. Transvaginal technique was performed to assess early pregnancy. COMPARISON:  None. FINDINGS: Intrauterine gestational sac: Single Yolk sac:  Visualized Embryo:  Not visualized Cardiac Activity: Not visualized Heart Rate:  bpm MSD: 8.1 mm   5 w   4 d CRL:    mm    w    d                  US EDC: Subchorionic hemorrhage:  Small subchorionic hemorrhage Maternal uterus/adnexae: No adnexal mass.  Trace free fluid. IMPRESSION: Five week 4 day intrauterine gestational sac by mean sac diameter. No embryo currently visualized. This could be followed with repeat ultrasound in 14 days to ensure expected progression. Small subchorionic hemorrhage. Electronically Signed   By: Charlett NoseKevin  Dover M.D.   On: 03/10/2019 19:33   [place] Ob Transvaginal  Result Date: 03/10/2019 CLINICAL DATA:  Vaginal  bleeding EXAM: OBSTETRIC <14 WK [place] AND TRANSVAGINAL OB US TECHNIQUE: Both transabdominal and transvaginal ultrasound examinations were performed for complete evaluation of the gestation as well as the maternal uterus, adnexal regions, and pelvic cul-de-sac. Transvaginal technique was performed to assess early pregnancy. COMPARISON:  None. FINDINGS: Intrauterine gestational sac: Single Yolk sac:  Visualized Embryo:  Not visualized Cardiac Activity: Not visualized Heart Rate:  bpm MSD: 8.1 mm   5 w   4 d CRL:    mm    w    d                  [place] EDC: Subchorionic hemorrhage:  Small subchorionic hemorrhage Maternal uterus/adnexae: No adnexal mass.  Trace free fluid. IMPRESSION: Five week 4 day intrauterine gestational sac by mean sac diameter. No embryo currently visualized. This could be followed with repeat ultrasound in 14 days to ensure expected progression. Small subchorionic  hemorrhage. Electronically Signed   By: Charlett Nose M.D.   On: 03/10/2019 19:33    ____________________________________________    PROCEDURES  Procedure(s) performed:    Procedures    Medications - No data to display   ____________________________________________   INITIAL IMPRESSION / ASSESSMENT AND PLAN / ED COURSE  Pertinent labs & imaging results that were available during my care of the patient were reviewed by me and considered in my medical decision making (see chart for details).  Review of the [place] CSRS was performed in accordance of the [facility] prior to dispensing any controlled drugs.           Assessment and plan Vaginal bleeding 33 year old female presents to the emergency department with vaginal bleeding for the past 1 day.  Vital signs were reassuring.  On physical exam, patient had no abdominal tenderness or guarding to palpation.  No CVA tenderness.  Transvaginal ultrasound reveals a 5-week 4-day intrauterine pregnancy.  Patient was advised to follow-up with OB/GYN in 2 weeks for repeat ultrasound.  Patient reports that she has good follow-up with her OB/GYN and will repeat her regarding updates in the care she received tonight.  Patient was advised that a small subchorionic hematoma was visualized on which could be source of patient's vaginal bleeding.  Tylenol was recommended for pelvic cramping.  Return precautions were given to return to the emergency department with new or worsening symptoms.  All patient questions were answered.     ____________________________________________  FINAL CLINICAL IMPRESSION(S) / ED DIAGNOSES  Final diagnoses:  Vaginal bleeding      NEW MEDICATIONS STARTED DURING THIS VISIT:  ED Discharge Orders    None          This chart was dictated using voice recognition software/Dragon. Despite best efforts to proofread, errors can occur which can change the meaning. Any change was purely unintentional.     Orvil Feil, PA-C 03/10/19 [name], MD 03/10/19 [phone]

## 2019-03-10 NOTE — ED Notes (Signed)
Pt informed patient relations that she just went to the bathroom and passed a large clot and continues to have heavy bleeding.

## 2019-03-10 NOTE — ED Triage Notes (Signed)
Pt states noticed some vaginal bleeding today, about [redacted] weeks pregnant. States low back pain. Denies clots. Some abd cramping. A&O, in wheelchair. States light bleeding, but hx miscarriage.

## 2019-04-26 NOTE — L&D Delivery Note (Signed)
Delivery Note  Date of delivery: 10/23/2019 Estimated Date of Delivery: 11/09/19 Patient's last menstrual period was 02/02/2019. EGA: [redacted]w[redacted]d  First Stage: Labor onset: 2000 6/30/221 Augmentation : AROM forebag, pitocin Analgesia Alexa Hunter: IVPM SROM at 2115, AROM forebag 2358  Alexa Hunter presented to L&D with contractions and SROM in triage. She was augmented with pitocin then later AROM of forebag. IVPM given for pain relief.   Second Stage: Complete dilation at 0243 Onset of pushing at 0244 FHR second stage: category II Delivery at 0251 on 10/23/2019  She progressed to complete and had a spontaneous vaginal birth of a live female, pushing over an intact perineum. The fetal head was delivered in direct OA position with restitution to ROA. One loop of tight nuchal cord, somersaulted through. Anterior then posterior shoulders delivered with minimal assistance. Baby placed on mom's abdomen and attended to by transition RN. Cord clamped and cut when pulseless by father of the baby. Cord blood obtained for newborn labs.  Third Stage: Placenta delivered spontaneously intact with 3VC at 0255 Placenta disposition: routine disposal Uterine tone firm / bleeding scant IV pitocin given for hemorrhage prophylaxis  B/l labial abrasions identified  Anesthesia for repair: n/a Repair: none Est. Blood Loss (mL): 200  Complications: none  Mom to postpartum.  Baby to Couplet care / Skin to Skin.  Newborn: Birth Weight: pending  Apgar Scores: 8, 9 Feeding planned: breast & formula   Genia Del, CNM 10/23/2019 3:12 AM

## 2019-05-03 LAB — OB RESULTS CONSOLE HEPATITIS B SURFACE ANTIGEN: Hepatitis B Surface Ag: NEGATIVE

## 2019-05-31 DIAGNOSIS — O09212 Supervision of pregnancy with history of pre-term labor, second trimester: Secondary | ICD-10-CM | POA: Diagnosis not present

## 2019-05-31 DIAGNOSIS — Z3A16 16 weeks gestation of pregnancy: Secondary | ICD-10-CM | POA: Diagnosis not present

## 2019-07-03 DIAGNOSIS — O26892 Other specified pregnancy related conditions, second trimester: Secondary | ICD-10-CM | POA: Diagnosis not present

## 2019-07-03 DIAGNOSIS — Z3A21 21 weeks gestation of pregnancy: Secondary | ICD-10-CM | POA: Diagnosis not present

## 2019-07-03 DIAGNOSIS — K219 Gastro-esophageal reflux disease without esophagitis: Secondary | ICD-10-CM | POA: Diagnosis not present

## 2019-07-03 DIAGNOSIS — E162 Hypoglycemia, unspecified: Secondary | ICD-10-CM | POA: Diagnosis not present

## 2019-07-03 DIAGNOSIS — J45909 Unspecified asthma, uncomplicated: Secondary | ICD-10-CM | POA: Diagnosis not present

## 2019-07-03 DIAGNOSIS — E860 Dehydration: Secondary | ICD-10-CM | POA: Diagnosis not present

## 2019-07-03 DIAGNOSIS — F419 Anxiety disorder, unspecified: Secondary | ICD-10-CM | POA: Diagnosis not present

## 2019-07-03 DIAGNOSIS — Z888 Allergy status to other drugs, medicaments and biological substances status: Secondary | ICD-10-CM | POA: Diagnosis not present

## 2019-07-03 DIAGNOSIS — F329 Major depressive disorder, single episode, unspecified: Secondary | ICD-10-CM | POA: Diagnosis not present

## 2019-07-11 DIAGNOSIS — Z362 Encounter for other antenatal screening follow-up: Secondary | ICD-10-CM | POA: Diagnosis not present

## 2019-08-09 DIAGNOSIS — Z3480 Encounter for supervision of other normal pregnancy, unspecified trimester: Secondary | ICD-10-CM | POA: Diagnosis not present

## 2019-08-09 LAB — OB RESULTS CONSOLE RPR: RPR: NONREACTIVE

## 2019-08-09 LAB — OB RESULTS CONSOLE HIV ANTIBODY (ROUTINE TESTING): HIV: NONREACTIVE

## 2019-08-31 IMAGING — US US OB COMP +14 WK
1 series · 16 of 28 positions shown · non-contrast
Comparison: none

PATIENT INFO:

NOIR
PERFORMED BY:
SERVICE(S) PROVIDED:
US OB COMP + 14 WK                                   76805.1
INDICATIONS:
35 weeks gestation of pregnancy
Intrauterine growth restriction
History of preterm labor
FETAL EVALUATION:
Num Of Fetuses:     1
Fetal Heart         155
Rate(bpm):
Cardiac Activity:   Present
Presentation:       Cephalic
Placenta:           Anterior, No previa
P. Cord Insertion:  Normal
Amniotic Fluid
AFI FV:      Normal
AFI Sum(cm)     %Tile       Largest Pocket(cm)
17.26           64
RUQ(cm)       RLQ(cm)       LUQ(cm)        LLQ(cm)
4.34
BIOMETRY:
BPD:      85.7  mm     G. Age:  34w 4d         36  %    CI:        75.27   %    70 - 86
FL/HC:      20.7   %    20.1 -
HC:      313.3  mm     G. Age:  35w 1d         18  %    HC/AC:      1.13        0.93 -
AC:       278   mm     G. Age:  31w 6d        < 3  %    FL/BPD:     75.8   %    71 - 87
FL:         65  mm     G. Age:  33w 4d          9  %    FL/AC:      23.4   %    20 - 24
HUM:      58.3  mm     G. Age:  33w 5d         39  %
Est. FW:    3522  gm      4 lb 9 oz      7  %
GESTATIONAL AGE:
U/S Today:     33w 6d                                        EDD:   04/07/17
Best:          35w 1d     Det. By:  Early Ultrasound         EDD:   03/29/17
(08/24/16)
ANATOMY:
Cavum:                 CSP visualized         Aortic Arch:            Normal appearance
Ventricles:            Normal appearance      Ductal Arch:            Normal appearance
Choroid Plexus:        Within Normal Limits   Diaphragm:              Within Normal Limits
Cerebellum:            Within Normal Limits   Stomach:                Seen
Posterior Fossa:       Suboptimal             Abdomen:                Within Normal
Limits
Nuchal Fold:           Beyond 22 weeks        Abdominal Wall:         Normal appearance
gestation
Face:                  Orbits visualized      Cord Vessels:           3 vessels
Lips:                  Normal appearance      Kidneys:                Normal appearance
Thoracic:              Within Normal Limits   Bladder:                Seen
Heart:                 4-Chamber view         Spine:                  Suboptimal views
appears normal
RVOT:                  Normal appearance      Upper Extremities:      Visualized
LVOT:                  Normal appearance      Lower Extremities:      Visualized
DOPPLER - FETAL VESSELS:
Umbilical Artery
S/D     %tile     RI    %tile                     PSV
(cm/s)
2.46       50   0.[REDACTED]
CERVIX UTERUS ADNEXA:
Left Ovary
Size(cm)       3.6  x   1.8    x  2.5       Vol(ml):
Right Ovary
Size(cm)       2.4  x    2     x  1.9       Vol(ml):

[Series 1: us ob comp +14 wk · 0.25mm/px · 16 of 63 slices shown]
[im 1/63]
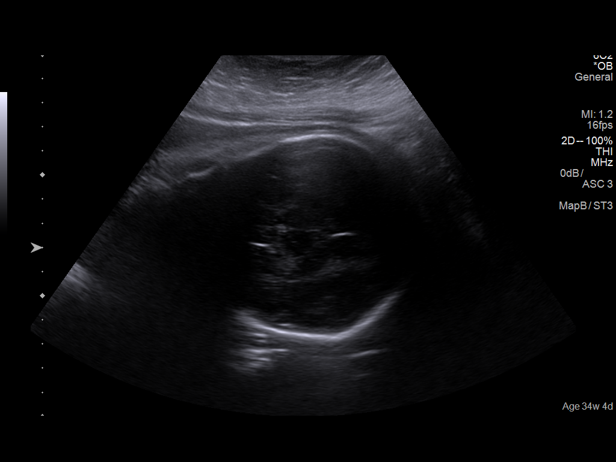
[im 5/63]
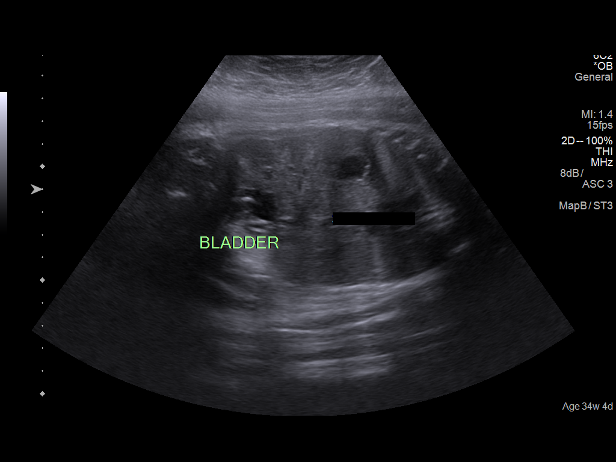
[im 10/63]
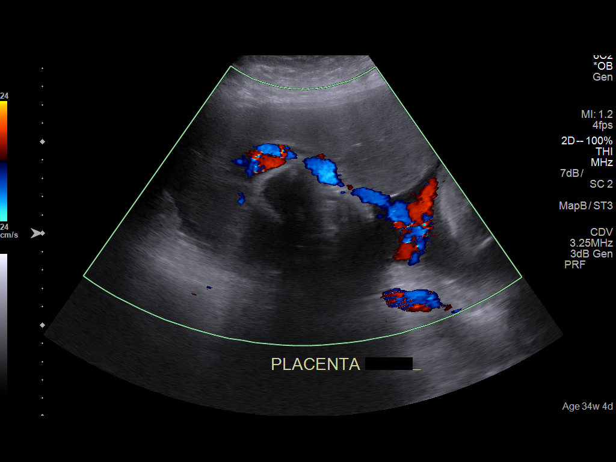
[im 14/63]
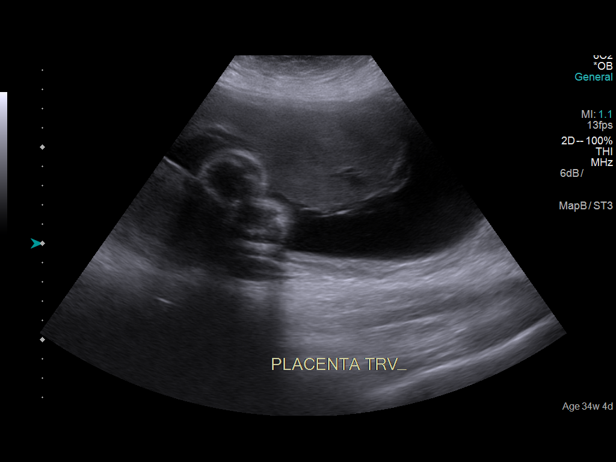
[im 17/63]
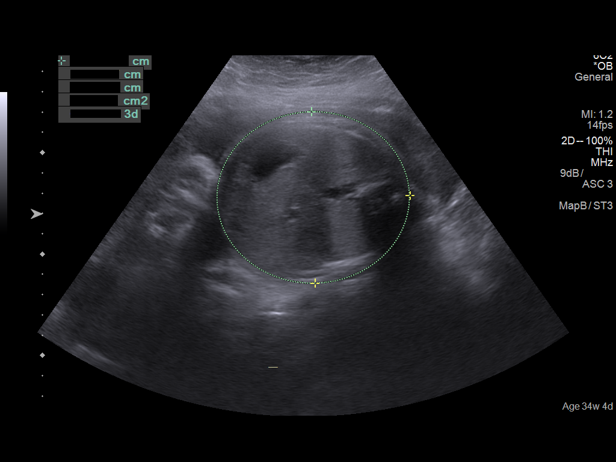
[im 21/63]
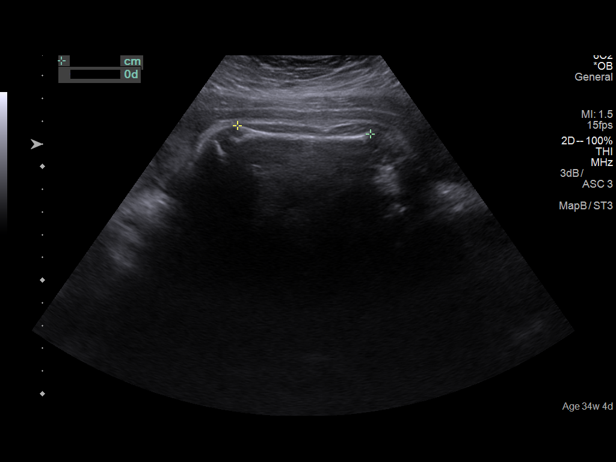
[im 26/63]
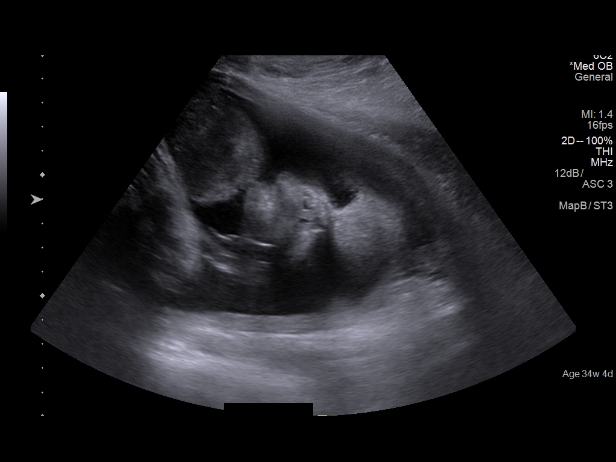
[im 30/63]
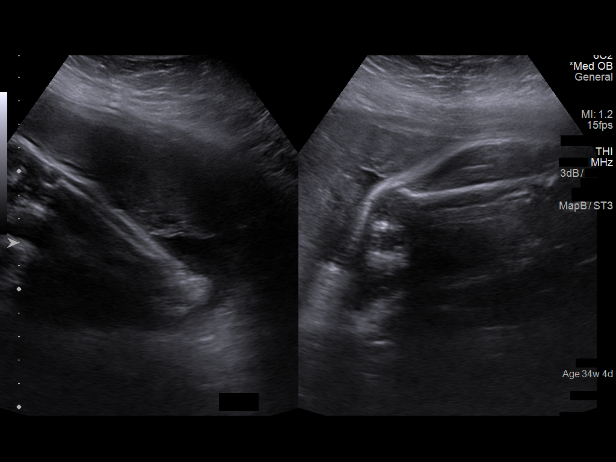
[im 33/63]
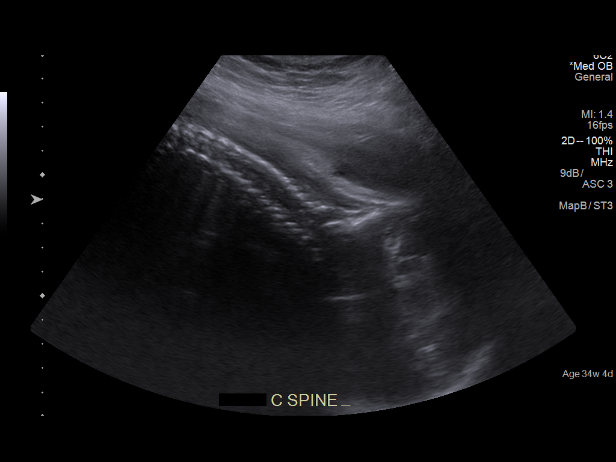
[im 37/63]
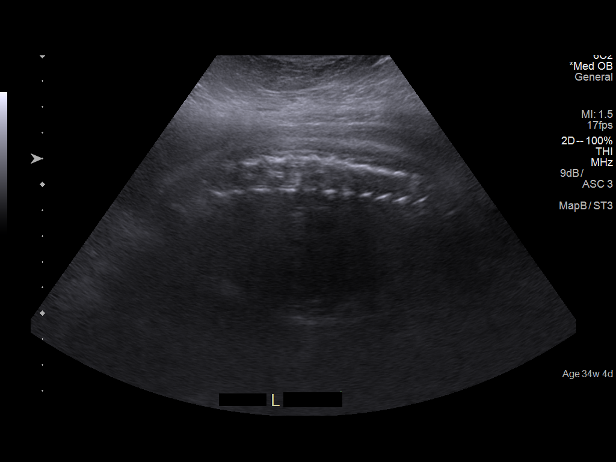
[im 42/63]
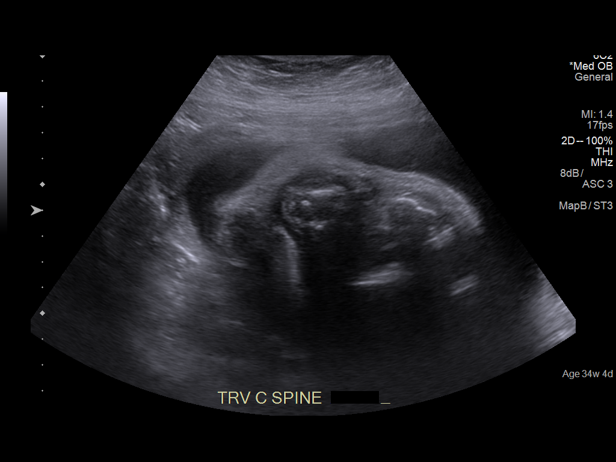
[im 46/63]
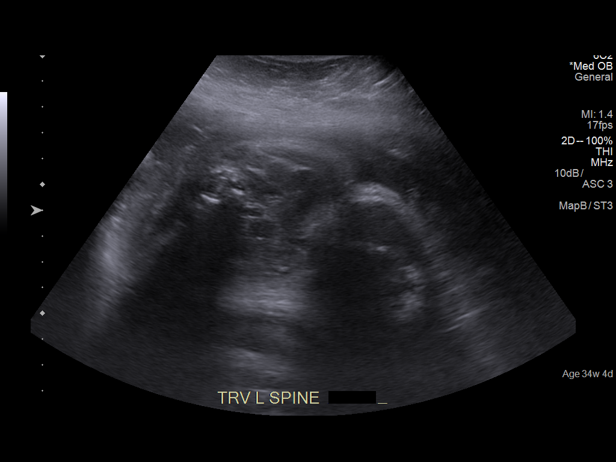
[im 49/63]
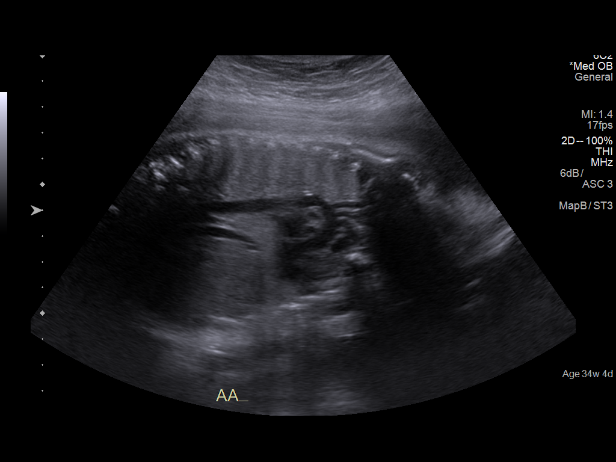
[im 53/63]
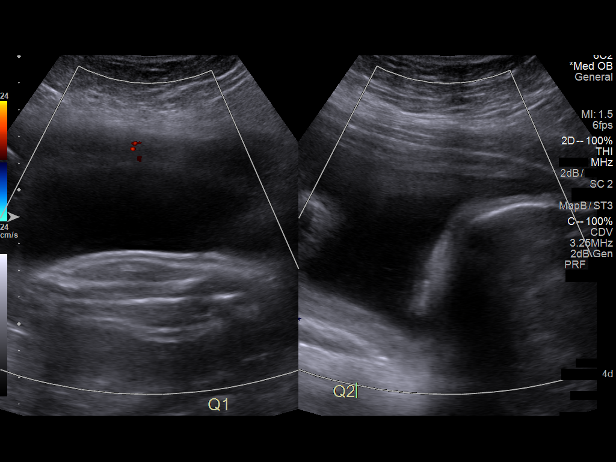
[im 58/63]
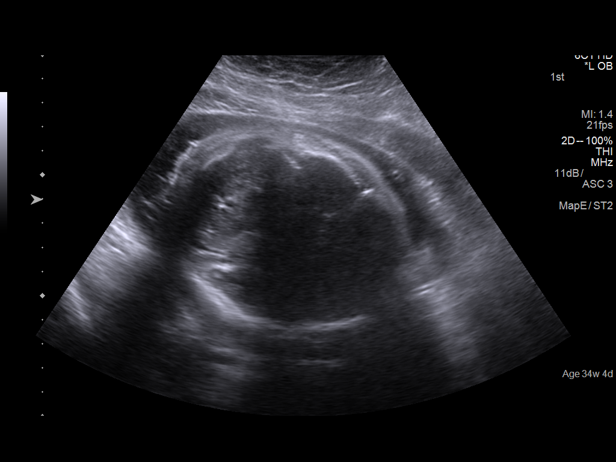
[im 63/63]
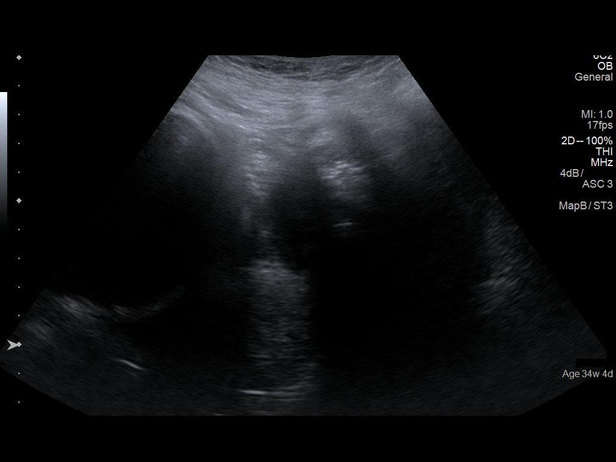

[16 of 28 positions shown; findings below may reference images not displayed]

IMPRESSION: Dear Dr.   Ahanger,

Thank you for referring your patient for detailed anatomic
survey and dopplers due to fetal growth restriction.  Dating is
by US at [REDACTED] on 08/24/16; measurements were
consistent with 9 weeks 0 days gestation.  She has a history
of gastric bypass and has gained 
25lb this pregnancy.  Her
history is also significant for a prior spontaneous preterm
birth at 34 weeks.  She is currently an inpatient due to
leakage of fluid and positive nitrazine testing.  She received
antenatal steroids during this admission.

Ultrasound demonstrates a single, live intrauterine pregnancy
at 35 weeks.  The amniotic fluid volume is normal and active
fetal movements are seen.

The estimeated fetal weight is 2078g (7th percentile).  The
umbilical artery doppers are within normal limits for
gestational age.

Detailed evaluation of the fetal anatomy was performed.
Images of the fetal abdominal cord insertion,  cardiac 3VV,
IVC/SVC were suboptimally seen.  The remainder of the
detailed anaotmic survey was unremarkable.

These findings were reviewed.  Findings are consistent with
fetal growth restriction. We addressed common etiologies for
growth restriction including maternal conditions such as
hypertensive disease, maternal exposures (eg tobacco use--
she is a non smoker), infectious processes (no sonographic
features associated with congenital infection) and aneuploidy
(no characteristic songraphic features associated with
aneuploidy).  We also addressed the fact that ultrasound may
not optimally screen for underlying causes of growth
restriction.  Given the normal dopplers, amniotic fluid, and
efw at the 7th percentile, we also addressed the fact that
these findings may be constitutional and not associated with
pathology.

I recommended the folllowing:

1. NST/AFI twice weekly and dopplers weekly.  Follow up
growth in 3 weeks.
2. Recommend delivery between 37-39 weeks.  We initially
discussed delivery at 37 weeks (EFW on ultrasound reporting
package was at a lower percentile, however, the reporting
package here is the one we conventionally use and places
the EFW at 7th percentile).  If antenatal testing or interval
growth is not reassuring, earlier delivery can be planned.

She reports living in this area  ( recently relocated) and plans
to deliver at [HOSPITAL].  We have scheduled her for
one weekly BPP/dopplers/AFI.  Please arrange for NST
weekly in your office.

Thank you for referring this patient to our care.

assistance.

## 2019-09-11 DIAGNOSIS — O99343 Other mental disorders complicating pregnancy, third trimester: Secondary | ICD-10-CM | POA: Diagnosis not present

## 2019-09-11 DIAGNOSIS — F419 Anxiety disorder, unspecified: Secondary | ICD-10-CM | POA: Diagnosis not present

## 2019-09-11 DIAGNOSIS — O479 False labor, unspecified: Secondary | ICD-10-CM | POA: Diagnosis not present

## 2019-09-11 DIAGNOSIS — O99613 Diseases of the digestive system complicating pregnancy, third trimester: Secondary | ICD-10-CM | POA: Diagnosis not present

## 2019-09-11 DIAGNOSIS — O3433 Maternal care for cervical incompetence, third trimester: Secondary | ICD-10-CM | POA: Diagnosis not present

## 2019-09-11 DIAGNOSIS — Z9884 Bariatric surgery status: Secondary | ICD-10-CM | POA: Diagnosis not present

## 2019-09-11 DIAGNOSIS — K219 Gastro-esophageal reflux disease without esophagitis: Secondary | ICD-10-CM | POA: Diagnosis not present

## 2019-09-11 DIAGNOSIS — F329 Major depressive disorder, single episode, unspecified: Secondary | ICD-10-CM | POA: Diagnosis not present

## 2019-09-11 DIAGNOSIS — R102 Pelvic and perineal pain: Secondary | ICD-10-CM | POA: Diagnosis not present

## 2019-09-11 DIAGNOSIS — O09213 Supervision of pregnancy with history of pre-term labor, third trimester: Secondary | ICD-10-CM | POA: Diagnosis not present

## 2019-09-11 DIAGNOSIS — Z3A31 31 weeks gestation of pregnancy: Secondary | ICD-10-CM | POA: Diagnosis not present

## 2019-09-11 DIAGNOSIS — O26893 Other specified pregnancy related conditions, third trimester: Secondary | ICD-10-CM | POA: Diagnosis not present

## 2019-09-12 DIAGNOSIS — J45909 Unspecified asthma, uncomplicated: Secondary | ICD-10-CM | POA: Diagnosis not present

## 2019-09-12 DIAGNOSIS — Z883 Allergy status to other anti-infective agents status: Secondary | ICD-10-CM | POA: Diagnosis not present

## 2019-09-12 DIAGNOSIS — O3433 Maternal care for cervical incompetence, third trimester: Secondary | ICD-10-CM | POA: Diagnosis not present

## 2019-09-12 DIAGNOSIS — O99513 Diseases of the respiratory system complicating pregnancy, third trimester: Secondary | ICD-10-CM | POA: Diagnosis not present

## 2019-09-12 DIAGNOSIS — Z3A31 31 weeks gestation of pregnancy: Secondary | ICD-10-CM | POA: Diagnosis not present

## 2019-09-12 DIAGNOSIS — Z7951 Long term (current) use of inhaled steroids: Secondary | ICD-10-CM | POA: Diagnosis not present

## 2019-09-19 DIAGNOSIS — R102 Pelvic and perineal pain: Secondary | ICD-10-CM | POA: Diagnosis not present

## 2019-09-19 DIAGNOSIS — O26899 Other specified pregnancy related conditions, unspecified trimester: Secondary | ICD-10-CM | POA: Diagnosis not present

## 2019-09-25 ENCOUNTER — Observation Stay
Admission: EM | Admit: 2019-09-25 | Discharge: 2019-09-25 | Disposition: A | Discharge status: Home / Self Care | Payer: Managed Care, Other (non HMO) | Attending: Obstetrics and Gynecology | Admitting: Obstetrics and Gynecology

## 2019-09-25 ENCOUNTER — Other Ambulatory Visit: Payer: Self-pay

## 2019-09-25 DIAGNOSIS — O09213 Supervision of pregnancy with history of pre-term labor, third trimester: Secondary | ICD-10-CM | POA: Diagnosis not present

## 2019-09-25 DIAGNOSIS — Z3A33 33 weeks gestation of pregnancy: Secondary | ICD-10-CM | POA: Diagnosis not present

## 2019-09-25 DIAGNOSIS — O99213 Obesity complicating pregnancy, third trimester: Secondary | ICD-10-CM | POA: Insufficient documentation

## 2019-09-25 DIAGNOSIS — Z79899 Other long term (current) drug therapy: Secondary | ICD-10-CM | POA: Diagnosis not present

## 2019-09-25 DIAGNOSIS — O99810 Abnormal glucose complicating pregnancy: Secondary | ICD-10-CM | POA: Diagnosis not present

## 2019-09-25 DIAGNOSIS — O09523 Supervision of elderly multigravida, third trimester: Secondary | ICD-10-CM | POA: Insufficient documentation

## 2019-09-25 DIAGNOSIS — O4703 False labor before 37 completed weeks of gestation, third trimester: Secondary | ICD-10-CM | POA: Diagnosis present

## 2019-09-25 DIAGNOSIS — R102 Pelvic and perineal pain: Secondary | ICD-10-CM | POA: Diagnosis not present

## 2019-09-25 DIAGNOSIS — O99843 Bariatric surgery status complicating pregnancy, third trimester: Secondary | ICD-10-CM | POA: Diagnosis not present

## 2019-09-25 DIAGNOSIS — Z0371 Encounter for suspected problem with amniotic cavity and membrane ruled out: Secondary | ICD-10-CM | POA: Diagnosis not present

## 2019-09-25 DIAGNOSIS — Z888 Allergy status to other drugs, medicaments and biological substances status: Secondary | ICD-10-CM | POA: Diagnosis not present

## 2019-09-25 DIAGNOSIS — O26893 Other specified pregnancy related conditions, third trimester: Secondary | ICD-10-CM | POA: Diagnosis not present

## 2019-09-25 LAB — OB RESULTS CONSOLE GC/CHLAMYDIA
Chlamydia: NEGATIVE
Gonorrhea: NEGATIVE

## 2019-09-25 LAB — URINALYSIS, COMPLETE (UACMP) WITH MICROSCOPIC
Bacteria, UA: NONE SEEN
Bilirubin Urine: NEGATIVE
Glucose, UA: NEGATIVE mg/dL
Hgb urine dipstick: NEGATIVE
Ketones, ur: NEGATIVE mg/dL
Leukocytes,Ua: NEGATIVE
Nitrite: NEGATIVE
Protein, ur: NEGATIVE mg/dL
Specific Gravity, Urine: 1.003 — ABNORMAL LOW (ref 1.005–1.030)
pH: 7 (ref 5.0–8.0)

## 2019-09-25 LAB — WET PREP, GENITAL
Clue Cells Wet Prep HPF POC: NONE SEEN
Sperm: NONE SEEN
Trich, Wet Prep: NONE SEEN
Yeast Wet Prep HPF POC: NONE SEEN

## 2019-09-25 LAB — CHLAMYDIA/NGC RT PCR (ARMC ONLY)
Chlamydia Tr: NOT DETECTED
N gonorrhoeae: NOT DETECTED

## 2019-09-25 LAB — RUPTURE OF MEMBRANE (ROM)PLUS: Rom Plus: NEGATIVE

## 2019-09-25 NOTE — Discharge Summary (Signed)
Alexa Hunter is a 34 y.o. female. She is at [redacted]w[redacted]d gestation. Patient's last menstrual period was 02/02/2019. Estimated Date of Delivery: 11/09/19  Prenatal care site: [facility]- unassigned  Current pregnancy complicated by:  1. Hx preterm birth x 2 at 57 and 34wks, receiving makena weekly on fridays.  2. Hx Gastric Sleeve 3. Obesity 4. Hx elevated GTT- followed by nutrition   Chief complaint: leaking of fluid noted for several days, irreg UCs and hx preterm birth.   [id number] at [redacted]w[redacted]d that presents from ED with c/o ctx, Vaginal pressure and LOF and passed a small blood clot around 0200 this morning. Pt states her cervix was checked yesterday morning at [facility] and was 2.5cm dilated.Pt sees [facility] for her prenatal care as she works there but stated "I live right up the road and wanted to be sure I was okay." Pt states she has a history of preterm delivery one at [redacted] weeks gestation and one at [redacted] weeks gestation. PT is currently getting Makena shots every week on Fridays. Pt was given BMZ doses on 5/26, 5/27 due to ctx and her history  S: Resting comfortably. no CTX, no VB. no large LOF or gush,  Active fetal movement. Denies: HA, visual changes, SOB, or RUQ/epigastric pain  Maternal Medical History:   Past Medical History:  Diagnosis Date  . Abnormal Pap smear of cervix   . Asthma   . Contact dermatitis     Past Surgical History:  Procedure Laterality Date  . CHOLECYSTECTOMY  2013  . 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 none      Allergies  Allergen Reactions  . Flagyl [Metronidazole] Nausea And Vomiting    Oral flagyl only    Prior to Admission medications   Medication Sig Start Date End Date Taking? Authorizing Provider  escitalopram (LEXAPRO) 10 MG tablet 1/2 tab po QD x 6 days, then whole tab daily Patient not taking: Reported on 09/25/2019 12/27/18   Agapito Games, MD  Multiple Vitamin (MULTIVITAMIN) capsule Take by mouth.    [provider]  VITAMIN D PO Vitamin D    [provider]      Social History: She  reports that she has never smoked. She has never used smokeless tobacco. She reports that she does not drink alcohol or use drugs.  Family History: family history includes Diabetes in her maternal grandmother and sister; Hypertension in her mother; Migraines in her mother.  Review of Systems: A full review of systems was performed and negative except as noted in the HPI.     O:  BP 99/65 (BP Location: Left Arm)   Pulse 78   Temp 98.2 F (36.8 C) (Oral)   Resp 12   Ht 5\' 4"  (1.626 m)   Wt 103.4 kg   LMP 02/02/2019   BMI 39.14 kg/m  No results found for this or any previous visit (from the past 48 hour(s)).   Constitutional: NAD, AAOx3  HE/ENT: extraocular movements grossly intact, moist mucous membranes CV: RRR PULM: nl respiratory effort, CTABL     Abd: gravid, non-tender, non-distended, soft      Ext: Non-tender, Nonedematous   Psych: mood appropriate, speech normal Pelvic: SSE done: pink/brown tinged mucus and malodorous vaginal DC noted. Cervix vis dilated. Wet prep, ROM Plus and GC/Ct sent.  SVE: 2-3/50/-2, soft/posterior. Unchanged from previous exams documented.     Fetal  monitoring: Cat I Appropriate for GA Baseline: 125bpm Variability: moderate Accelerations: present x >  2 Decelerations absent Time 29mins  TOCO: No UCs, mild uterine irritability.     A/P: 34 y.o. [redacted]w[redacted]d here for antenatal surveillance for leaking of fluid, preterm UCs.   Principle Diagnosis:  High risk pregnancy in third trimester   Preterm labor: not present.   Fetal Wellbeing: Reassuring Cat 1 tracing with reactive NST   ROM Plus: negative  Wet prep: negative  UA: negative  D/c home stable, precautions reviewed, follow-up as scheduled.    Francetta Found, CNM 09/25/2019  12:23 PM

## 2019-09-25 NOTE — OB Triage Note (Signed)
Discharge orders received. RN reviewed discharge instructions with the patient and educated on signs and symptoms of preterm labor. Pt verbalized understanding of instructions and has no further questions at this time. Pt discharged home in stable condition with family member and instructed to keep her upcoming appointment on 09/26/19 with her OB.

## 2019-09-25 NOTE — OB Triage Note (Addendum)
Pt is a G8PP2 at [redacted]w[redacted]d that presents from ED with c/o ctx, Vaginal pressure and LOF and passed a small blood clot around 0200 this morning. 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 PT is currently getting Makena shots every week on Fridays. Pt was given BMZ doses on 5/26, 5/27 due to ctx and her history. EFM applied and initial FHT 130.

## 2019-10-01 ENCOUNTER — Other Ambulatory Visit: Payer: Self-pay

## 2019-10-01 ENCOUNTER — Encounter: Payer: Self-pay | Admitting: Obstetrics and Gynecology

## 2019-10-01 ENCOUNTER — Observation Stay
Admission: EM | Admit: 2019-10-01 | Discharge: 2019-10-01 | Disposition: A | Discharge status: Home / Self Care | Payer: Managed Care, Other (non HMO) | Attending: Obstetrics and Gynecology | Admitting: Obstetrics and Gynecology

## 2019-10-01 DIAGNOSIS — Z9884 Bariatric surgery status: Secondary | ICD-10-CM | POA: Insufficient documentation

## 2019-10-01 DIAGNOSIS — Z79899 Other long term (current) drug therapy: Secondary | ICD-10-CM | POA: Diagnosis not present

## 2019-10-01 DIAGNOSIS — O09213 Supervision of pregnancy with history of pre-term labor, third trimester: Principal | ICD-10-CM | POA: Insufficient documentation

## 2019-10-01 DIAGNOSIS — O4703 False labor before 37 completed weeks of gestation, third trimester: Secondary | ICD-10-CM

## 2019-10-01 DIAGNOSIS — J45909 Unspecified asthma, uncomplicated: Secondary | ICD-10-CM | POA: Diagnosis not present

## 2019-10-01 DIAGNOSIS — O23593 Infection of other part of genital tract in pregnancy, third trimester: Secondary | ICD-10-CM | POA: Diagnosis not present

## 2019-10-01 DIAGNOSIS — Z3A34 34 weeks gestation of pregnancy: Secondary | ICD-10-CM | POA: Diagnosis not present

## 2019-10-01 DIAGNOSIS — O99513 Diseases of the respiratory system complicating pregnancy, third trimester: Secondary | ICD-10-CM | POA: Insufficient documentation

## 2019-10-01 DIAGNOSIS — O99843 Bariatric surgery status complicating pregnancy, third trimester: Secondary | ICD-10-CM | POA: Diagnosis not present

## 2019-10-01 HISTORY — DX: False labor before 37 completed weeks of gestation, third trimester: O47.03

## 2019-10-01 LAB — URINALYSIS, COMPLETE (UACMP) WITH MICROSCOPIC
Bilirubin Urine: NEGATIVE
Glucose, UA: NEGATIVE mg/dL
Hgb urine dipstick: NEGATIVE
Ketones, ur: NEGATIVE mg/dL
Nitrite: NEGATIVE
Protein, ur: 30 mg/dL — AB
Specific Gravity, Urine: 1.024 (ref 1.005–1.030)
pH: 5 (ref 5.0–8.0)

## 2019-10-01 LAB — WET PREP, GENITAL
Clue Cells Wet Prep HPF POC: NONE SEEN
Sperm: NONE SEEN
Trich, Wet Prep: NONE SEEN

## 2019-10-01 MED ORDER — TERCONAZOLE 0.4 % VA CREA: 1.0000 | TOPICAL_CREAM | Freq: Every day | VAGINAL | 0 refills | Status: DC

## 2019-10-01 MED ORDER — TERBUTALINE SULFATE 1 MG/ML IJ SOLN
0.2500 mg | Freq: Once | INTRAMUSCULAR | Status: AC | PRN
  Administered 2019-10-01: 0.25 mg via SUBCUTANEOUS
  Filled 2019-10-01: qty 1

## 2019-10-01 MED ORDER — ONDANSETRON HCL 4 MG/2ML IJ SOLN
4.0000 mg | Freq: Four times a day (QID) | INTRAMUSCULAR | Status: DC | PRN
  Administered 2019-10-01: 4 mg via INTRAVENOUS
  Filled 2019-10-01: qty 2

## 2019-10-01 MED ORDER — LACTATED RINGERS IV SOLN: INTRAVENOUS | Status: DC

## 2019-10-01 MED ORDER — PRENATAL MULTIVITAMIN CH: 1.0000 | ORAL_TABLET | Freq: Every day | ORAL | Status: DC

## 2019-10-01 MED ORDER — ACETAMINOPHEN 325 MG PO TABS: 650.0000 mg | ORAL_TABLET | ORAL | Status: DC | PRN

## 2019-10-01 NOTE — OB Triage Note (Signed)
Pt presents for gush of fluid around 6:40 this AM along with cramping in her abd and lower back. +FM. Denies bleeding. Pt gets PNC at [facility].

## 2019-10-01 NOTE — Progress Notes (Signed)
Pt given discharge instructions including labor precautions and prescription. Pt stable at discharge

## 2019-10-01 NOTE — Discharge Summary (Signed)
Alexa Hunter is a 34 y.o. female. She is at [redacted]w[redacted]d gestation. Patient's last menstrual period was 02/02/2019. Estimated Date of Delivery: 11/09/19  Prenatal care site: [facility]- unassigned   Current pregnancy complicated by:  1. Hx preterm birth x 2 at 61 and 34wks, receiving makena weekly on fridays.  2. Hx Gastric Sleeve 3. Obesity 4. Hx elevated GTT- followed by nutrition   Chief complaint: Gush of fluid this morning, increased UCs and pressure, seen at her OB last Thursday 6/3.   [id number] at [redacted]w[redacted]d that presents from ED with c/o ctx, Vaginal pressure and LOF.  - gush of watery fluid noted, landed on carpet, unsure of what color it was.  - increased UCs, not timing them.   - Pt sees [facility] for her prenatal care as she works there but stated "I live right [place] the road and wanted to be sure I was okay." Pt states she has a history of preterm delivery one at [redacted] weeks gestation and one at [redacted] weeks gestation. PT is currently getting Makena shots every week on Fridays. Pt was given BMZ doses on 5/26, 5/27 due to ctx and her history  S: Resting comfortably. no CTX, no VB. no large LOF or gush,  Active fetal movement. Denies: HA, visual changes, SOB, or RUQ/epigastric pain  Maternal Medical History:   Past Medical History:  Diagnosis Date   Abnormal Pap smear of cervix    Asthma    Contact dermatitis     Past Surgical History:  Procedure Laterality Date   CHOLECYSTECTOMY  2013   GASTRIC BYPASS  09/28/2015   none      Allergies  Allergen Reactions   Flagyl [Metronidazole] Nausea And Vomiting    Oral flagyl only    Prior to Admission medications   Medication Sig Start Date End Date Taking? Authorizing Provider  escitalopram (LEXAPRO) 10 MG tablet 1/2 tab po QD x 6 days, then whole tab daily Patient not taking: Reported on 09/25/2019 12/27/18   Agapito Games, MD  Multiple Vitamin (MULTIVITAMIN) capsule Take by mouth.    [provider]   VITAMIN D PO Vitamin D    [provider]      Social History: She  reports that she has never smoked. She has never used smokeless tobacco. She reports that she does not drink alcohol or use drugs.  Family History: family history includes Diabetes in her maternal grandmother and sister; Hypertension in her mother; Migraines in her mother.   Review of Systems: A full review of systems was performed and negative except as noted in the HPI.     O:  BP 103/63 (BP Location: Left Arm)    Pulse 86    Temp 98.1 F (36.7 C) (Oral)    Resp 16    Ht 5\' 4"  (1.626 m)    Wt 103 kg    LMP 02/02/2019    BMI 38.96 kg/m  Results for orders placed or performed during the hospital encounter of 10/01/19 (from the past 48 hour(s))  Wet prep, genital   Collection Time: 10/01/19  8:23 AM  Result Value Ref Range   Yeast Wet Prep HPF POC PRESENT (A) NONE SEEN   Trich, Wet Prep NONE SEEN NONE SEEN   Clue Cells Wet Prep HPF POC NONE SEEN NONE SEEN   WBC, Wet Prep HPF POC FEW (A) NONE SEEN   Sperm NONE SEEN   Urinalysis, Complete w Microscopic   Collection Time: 10/01/19  8:23 AM  Result Value Ref Range   Color, Urine YELLOW (A) YELLOW   APPearance CLOUDY (A) CLEAR   Specific Gravity, Urine 1.024 1.005 - 1.030   pH 5.0 5.0 - 8.0   Glucose, UA NEGATIVE NEGATIVE mg/dL   Hgb urine dipstick NEGATIVE NEGATIVE   Bilirubin Urine NEGATIVE NEGATIVE   Ketones, ur NEGATIVE NEGATIVE mg/dL   Protein, ur 30 (A) NEGATIVE mg/dL   Nitrite NEGATIVE NEGATIVE   Leukocytes,Ua LARGE (A) NEGATIVE   RBC / HPF 0-5 0 - 5 RBC/hpf   WBC, UA 11-20 0 - 5 WBC/hpf   Bacteria, UA MANY (A) NONE SEEN   Squamous Epithelial / LPF 21-50 0 - 5   Mucus PRESENT      Constitutional: NAD, AAOx3  HE/ENT: extraocular movements grossly intact, moist mucous membranes CV: RRR PULM: nl respiratory effort, CTABL     Abd: gravid, non-tender, non-distended, soft      Ext: Non-tender, Nonedematous   Psych: mood appropriate, speech  normal Pelvic: SSE done:mod amount thick white vaginal DC noted. Cervix vis dilated. Negative pooling, Wet prep sent  SVE: 3-4/75/-2, soft/posterior - no cervical change on recheck at 1200    Fetal  monitoring: Cat I Appropriate for GA Baseline: 135bpm Variability: moderate Accelerations: present x >2 Decelerations absent Time 75mins  TOCO: occasional UCs, mild uterine irritability noted, improved after single dose of terbutaline and IV hydration.     A/P: 34 y.o. [redacted]w[redacted]d here for antenatal surveillance for leaking of fluid, preterm UCs.   Principle Diagnosis:  High risk pregnancy in third trimester   Preterm labor: not present, no cervical change after 3.5hrs. s/p IV hydration and terb x 1 dose which stopped UCs.   Fetal Wellbeing: Reassuring Cat 1 tracing with reactive NST   Wet prep: + yeast, Rx Terazol to pharmacy on record.   UA: many bact, large leuk, urine culture pending  D/c home stable, precautions reviewed, follow-up as scheduled with [facility] or may consider transfer to [facility] if she plans to deliver here at [facility].    Francetta Found, CNM 10/01/2019  12:14 PM

## 2019-10-02 LAB — CULTURE, OB URINE: Culture: <10000 — AB

## 2019-10-04 ENCOUNTER — Encounter: Payer: Self-pay | Admitting: Obstetrics and Gynecology

## 2019-10-04 ENCOUNTER — Observation Stay
Admission: EM | Admit: 2019-10-04 | Discharge: 2019-10-04 | Disposition: A | Discharge status: Home / Self Care | Payer: Managed Care, Other (non HMO)

## 2019-10-04 ENCOUNTER — Other Ambulatory Visit: Payer: Self-pay

## 2019-10-04 ENCOUNTER — Emergency Department
Admission: EM | Admit: 2019-10-04 | Discharge: 2019-10-04 | Disposition: A | Discharge status: Still In Hospital | Payer: Managed Care, Other (non HMO) | Source: Home / Self Care | Attending: Student | Admitting: Student

## 2019-10-04 DIAGNOSIS — Z3A35 35 weeks gestation of pregnancy: Secondary | ICD-10-CM | POA: Diagnosis not present

## 2019-10-04 DIAGNOSIS — O09213 Supervision of pregnancy with history of pre-term labor, third trimester: Secondary | ICD-10-CM | POA: Diagnosis not present

## 2019-10-04 DIAGNOSIS — O99613 Diseases of the digestive system complicating pregnancy, third trimester: Principal | ICD-10-CM | POA: Insufficient documentation

## 2019-10-04 DIAGNOSIS — K5900 Constipation, unspecified: Secondary | ICD-10-CM

## 2019-10-04 DIAGNOSIS — O99843 Bariatric surgery status complicating pregnancy, third trimester: Secondary | ICD-10-CM | POA: Diagnosis not present

## 2019-10-04 DIAGNOSIS — R195 Other fecal abnormalities: Secondary | ICD-10-CM

## 2019-10-04 LAB — BASIC METABOLIC PANEL
Anion gap: 7 (ref 5–15)
BUN: 7 mg/dL (ref 6–20)
CO2: 24 mmol/L (ref 22–32)
Calcium: 8.3 mg/dL — ABNORMAL LOW (ref 8.9–10.3)
Chloride: 104 mmol/L (ref 98–111)
Creatinine, Ser: 0.61 mg/dL (ref 0.44–1.00)
GFR calc Af Amer: >60 mL/min (ref >60)
GFR calc non Af Amer: >60 mL/min (ref >60)
Glucose, Bld: 79 mg/dL (ref 70–99)
Potassium: 3.5 mmol/L (ref 3.5–5.1)
Sodium: 135 mmol/L (ref 135–145)

## 2019-10-04 LAB — CBC WITH DIFFERENTIAL/PLATELET
Abs Immature Granulocytes: 0.05 10*3/uL (ref 0.00–0.07)
Basophils Absolute: 0 10*3/uL (ref 0.0–0.1)
Basophils Relative: 0 %
Eosinophils Absolute: 0 10*3/uL (ref 0.0–0.5)
Eosinophils Relative: 0 %
HCT: 31.6 % — ABNORMAL LOW (ref 36.0–46.0)
Hemoglobin: 9.9 g/dL — ABNORMAL LOW (ref 12.0–15.0)
Immature Granulocytes: 0 %
Lymphocytes Relative: 17 %
Lymphs Abs: 2 10*3/uL (ref 0.7–4.0)
MCH: 23.9 pg — ABNORMAL LOW (ref 26.0–34.0)
MCHC: 31.3 g/dL (ref 30.0–36.0)
MCV: 76.3 fL — ABNORMAL LOW (ref 80.0–100.0)
Monocytes Absolute: 0.7 10*3/uL (ref 0.1–1.0)
Monocytes Relative: 6 %
Neutro Abs: 8.8 10*3/uL — ABNORMAL HIGH (ref 1.7–7.7)
Neutrophils Relative %: 77 %
Platelets: 314 10*3/uL (ref 150–400)
RBC: 4.14 MIL/uL (ref 3.87–5.11)
RDW: 15.6 % — ABNORMAL HIGH (ref 11.5–15.5)
WBC: 11.7 10*3/uL — ABNORMAL HIGH (ref 4.0–10.5)
nRBC: 0 % (ref 0.0–0.2)

## 2019-10-04 MED ORDER — CALCIUM CARBONATE ANTACID 500 MG PO CHEW: 2.0000 | CHEWABLE_TABLET | ORAL | Status: DC | PRN

## 2019-10-04 MED ORDER — FLEET ENEMA 7-19 GM/118ML RE ENEM
1.0000 | ENEMA | Freq: Once | RECTAL | Status: AC
  Administered 2019-10-04: 1 via RECTAL

## 2019-10-04 MED ORDER — POLYETHYLENE GLYCOL 3350 17 G PO PACK
17.0000 g | PACK | Freq: Once | ORAL | Status: AC
  Administered 2019-10-04: 17 g via ORAL
  Filled 2019-10-04: qty 1

## 2019-10-04 NOTE — ED Notes (Signed)
Fetal heart tones auscultated by dr Colon Branch at 150-160 per min

## 2019-10-04 NOTE — Progress Notes (Signed)
CNM at bedside to reassess patient and discuss plan for discharge. CNM reviewed discharge instructions with patient and answered all questioned. Patient verbalized understanding.

## 2019-10-04 NOTE — ED Provider Notes (Addendum)
[facility] Emergency Department Provider Note  ____________________________________________   First MD Initiated Contact with Patient 10/04/19 0945     (approximate)  I have reviewed the triage vital signs and the nursing notes.  History  Chief Complaint Constipation    HPI Alexa Hunter is a 34 y.o. female [id number], hx multiple preterm deliveries, currently [redacted]w[redacted]d who presents to the ER for constipation, rectal bleeding, and contractions.  Patient states she has been significantly constipated over the last week.  She has been trying multiple stool softeners and over-the-counter medications without relief.  This morning around 8 AM she strained and attempted to have a bowel movement.  She reproduced two small very hard stools and then passed a large blood clot, which concerned her.  After this she began experiencing more significant contractions. No further rectal bleeding. States she has been having contractions on and off for the past month or so, but after this morning's attempted BM they seemed more intense.  Per EMS they are occurring every 10 minutes or so.  Denies any vaginal bleeding, leakage of fluid.  Positive fetal movement.  Not on any blood thinning medications.  History of gastric bypass.   Past Medical Hx Past Medical History:  Diagnosis Date  . Abnormal Pap smear of cervix   . Asthma   . Contact dermatitis     Problem List Patient Active Problem List   Diagnosis Date Noted  . Preterm uterine contractions in third trimester, antepartum 10/01/2019  . Preterm uterine contractions, antepartum, third trimester 09/25/2019  . Acute depression 12/27/2018  . Iron deficiency anemia 06/07/2017  . Vitamin D deficiency 06/06/2017  . Preterm premature rupture of membranes 02/25/2017  . Pregnancy affected by fetal growth restriction 02/22/2017  . Labor and delivery, indication for care 02/20/2017  . History of preterm delivery, currently  pregnant in third trimester 02/20/2017  . Abnormal glucose tolerance in pregnancy 12/12/2016  . 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 History of miscarriage 08/10/2016  . History of preterm delivery 08/10/2016  . 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 Adnexal cyst 11/24/2015  . Mild intermittent asthma without complication 40/01/2724  . History of depression 05/13/2015  . Plantar fasciitis, right 03/14/2014  . History of abnormal cervical Pap smear 05/21/2013  . Eczema 12/10/2010  . HYPERLIPIDEMIA 03/23/2010  . OBESITY 03/23/2010  . MIGRAINE HEADACHE 08/26/2008  . 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 RHINITIS, ALLERGIC NOS 03/17/2006  . ASTHMA 03/17/2006  . Asthma 03/17/2006    Past Surgical Hx Past Surgical History:  Procedure Laterality Date  . CHOLECYSTECTOMY  2013  . 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 none      Medications Prior to Admission medications   Medication Sig Start Date End Date Taking? Authorizing Provider  escitalopram (LEXAPRO) 10 MG tablet 1/2 tab po QD x 6 days, then whole tab daily Patient not taking: Reported on 09/25/2019 12/27/18   Hali Marry, MD  Multiple Vitamin (MULTIVITAMIN) capsule Take by mouth.    [provider]  terconazole (TERAZOL 7) 0.4 % vaginal cream Place 1 applicator vaginally at bedtime. 10/01/19   McVey, Murray Hodgkins, CNM  VITAMIN D PO Vitamin D    [provider]    Allergies Flagyl [metronidazole]  Family Hx Family History  Problem Relation Age of Onset  . Migraines Mother   . Hypertension Mother   . Diabetes Maternal Grandmother   . Diabetes Sister     Social Hx Social History   Tobacco Use  . Smoking  status: Never Smoker  . Smokeless tobacco: Never Used  Vaping Use  . Vaping Use: Never assessed  Substance Use Topics  . Alcohol use: No  . Drug use: No     Review of Systems  Constitutional: Negative for fever. Negative for chills. Eyes: Negative for visual changes. ENT: Negative for sore  throat. Cardiovascular: Negative for chest pain. Respiratory: Negative for shortness of breath. Gastrointestinal: + constipation, rectal bleeding Genitourinary: + contractions Musculoskeletal: Negative for leg swelling. Skin: Negative for rash. Neurological: Negative for headaches.   Physical Exam  Vital Signs: ED Triage Vitals  Enc Vitals Group     BP 10/04/19 0935 112/71     Pulse Rate 10/04/19 0935 84     Resp 10/04/19 0935 16     Temp 10/04/19 0935 97.9 F (36.6 C)     Temp Source 10/04/19 0935 Oral     SpO2 10/04/19 0935 100 %     Weight 10/04/19 0938 226 lb 13.7 oz (102.9 kg)     Height 10/04/19 0938 5\' 4"  (1.626 m)     Head Circumference --      Peak Flow --      Pain Score 10/04/19 0936 8     Pain Loc --      Pain Edu? --      Excl. in GC? --     Constitutional: Alert and oriented.  Appears moderately uncomfortable, but non-toxic.   Head: Normocephalic. Atraumatic. Eyes: Conjunctivae clear. Sclera anicteric. Pupils equal and symmetric. Nose: No masses or lesions. No congestion or rhinorrhea. Mouth/Throat: Wearing mask.  Neck: No stridor. Trachea midline.  Cardiovascular: Normal rate. Extremities well perfused. Respiratory: Normal respiratory effort.  Gastrointestinal: Soft.  Gravid. FHT 150-160s. Genitourinary: RN chaperone present.  External exam without evidence of anal fissure or external hemorrhoids.  DRE with brown stool, guaiac positive. Musculoskeletal: No lower extremity edema. No deformities. Neurologic:  Normal speech and language. No gross focal or lateralizing neurologic deficits are appreciated.  Skin: Skin is warm, dry and intact. No rash noted. Psychiatric: Mood and affect are appropriate for situation.   Procedures  Procedure(s) performed (including critical care):  Procedures   Initial Impression / Assessment and Plan / MDM / ED Course  34 y.o. female who presents to the ED with constipation, rectal bleeding, contractions.  Patient  [redacted]w[redacted]d pregnant, with a history of multiple preterm deliveries.  Ddx: rectal bleeding in the setting of constipation, hemorrhoid, diverticular bleeding Ddx: Braxton Hicks contractions, dehydration, preterm labor  Exam as above, no evidence of external hemorrhoids or fissures, DRE with brown stool, guaiac positive.  Not on any blood thinning medications, no high risk factors for GI bleed such as excessive alcohol use or cirrhosis.  As such, from an ER perspective feel CBC is reasonable, but would otherwise advise aggressive bowel regimen, and postpartum follow-up with GI as an outpatient.  Otherwise, at this time feel further evaluation of her contractions is indicated.  Discussed with OB RN and gave report, will transport patient to L&D for further monitoring.  _______________________________   As part of my medical decision making I have reviewed available labs, radiology tests, reviewed old records/performed chart review.    Final Clinical Impression(s) / ED Diagnosis  Final diagnoses:  Constipation, unspecified constipation type  Guaiac positive stools       Note:  This document was prepared using Dragon voice recognition software and may include unintentional dictation errors.     [redacted]w[redacted]d., MD 10/04/19 1014

## 2019-10-04 NOTE — Progress Notes (Signed)
Patient arrived from ED after evaluation of rectal bleeding. She states she is very uncomfortable from the stool impaction/constipation and she states it is difficult to sit down because she can feel hard stool. She reports normal fetal movement, denies fluid or vaginal leaking of fluids. She says she is having some mild crampiness abdominally right now but no consistent contractions at present and does not feel that labor is imminent at the moment. I palpated her stomach while we were talking and did not feel any contractions. She states that she is here primarily for the constipation. She wanted to attempt a bowel movement before sitting down for fetal monitoring. She is also trying splinting the stool with her thumb while she attempts a bowel movement. I gave her a cup of warm water and a cup of prune juice to drink as well. As soon as she is done with bowel movement attempt we will put her on the fetal monitors and further assess possible preterm labor.

## 2019-10-04 NOTE — Discharge Summary (Signed)
Alexa Hunter is a 34 y.o. female. She is at [redacted]w[redacted]d gestation. Patient's last menstrual period was 02/02/2019. Estimated Date of Delivery: 11/09/19  Prenatal care site: [facility]  Current pregnancy complicated by:  1. Hx preterm birth x 2 at 88 and 34wks, receiving makena weekly on fridays.  2. Hx Gastric Sleeve 3. Obesity 4. Hx elevated GTT- followed by nutrition   Chief complaint: Constipation   [name] presented to the ED this morning with complaints of rectal bleeding and painful constipation.  She had been trying to have a bowel movement but had been unsuccessful.  Currently takes Colace TID as a stool softener.  She is followed by [facility] for her prenatal care as she works there but states "I live closer to here."    S: Resting comfortably with intermittent abdominal cramps, no VB.no LOF,  Active fetal movement.   Maternal Medical History:  Past Medical Hx:  has a past medical history of Abnormal Pap smear of cervix, Asthma, and Contact dermatitis.    Past Surgical Hx:  has a past surgical history that includes none; Cholecystectomy (2013); and Gastric bypass (09/28/2015).   Allergies  Allergen Reactions  . Flagyl [Metronidazole] Nausea And Vomiting    Oral flagyl only     Prior to Admission medications   Medication Sig Start Date End Date Taking? Authorizing Provider  escitalopram (LEXAPRO) 10 MG tablet 1/2 tab po QD x 6 days, then whole tab daily Patient not taking: Reported on 09/25/2019 12/27/18   Agapito Games, MD  Multiple Vitamin (MULTIVITAMIN) capsule Take by mouth.    [provider]  terconazole (TERAZOL 7) 0.4 % vaginal cream Place 1 applicator vaginally at bedtime. 10/01/19   McVey, Prudencio Pair, CNM  VITAMIN D PO Vitamin D    [provider]    Social History: She  reports that she has never smoked. She has never used smokeless tobacco. She reports that she does not drink alcohol and does not use drugs.  Family History:  family history includes Diabetes in her maternal grandmother and sister; Hypertension in her mother; Migraines in her mother.   Review of Systems: A full review of systems was performed and negative except as noted in the HPI.    O:  BP (!) 95/58 (BP Location: Right Arm)   Pulse 67   Temp 97.7 F (36.5 C) (Oral)   Resp 16   LMP 02/02/2019  Results for orders placed or performed during the hospital encounter of 10/04/19 (from the past 48 hour(s))  Basic metabolic panel   Collection Time: 10/04/19  9:40 AM  Result Value Ref Range   Sodium 135 135 - 145 mmol/L   Potassium 3.5 3.5 - 5.1 mmol/L   Chloride 104 98 - 111 mmol/L   CO2 24 22 - 32 mmol/L   Glucose, Bld 79 70 - 99 mg/dL   BUN 7 6 - 20 mg/dL   Creatinine, Ser 1.75 0.44 - 1.00 mg/dL   Calcium 8.3 (L) 8.9 - 10.3 mg/dL   GFR calc non Af Amer >60 >60 mL/min   GFR calc Af Amer >60 >60 mL/min   Anion gap 7 5 - 15  CBC with Differential   Collection Time: 10/04/19  9:40 AM  Result Value Ref Range   WBC 11.7 (H) 4.0 - 10.5 K/uL   RBC 4.14 3.87 - 5.11 MIL/uL   Hemoglobin 9.9 (L) 12.0 - 15.0 g/dL   HCT 10.2 (L) 36 - 46 %   MCV  76.3 (L) 80.0 - 100.0 fL   MCH 23.9 (L) 26.0 - 34.0 pg   MCHC 31.3 30.0 - 36.0 g/dL   RDW 15.6 (H) 11.5 - 15.5 %   Platelets 314 150 - 400 K/uL   nRBC 0.0 0.0 - 0.2 %   Neutrophils Relative % 77 %   Neutro Abs 8.8 (H) 1.7 - 7.7 K/uL   Lymphocytes Relative 17 %   Lymphs Abs 2.0 0.7 - 4.0 K/uL   Monocytes Relative 6 %   Monocytes Absolute 0.7 0 - 1 K/uL   Eosinophils Relative 0 %   Eosinophils Absolute 0.0 0 - 0 K/uL   Basophils Relative 0 %   Basophils Absolute 0.0 0 - 0 K/uL   Immature Granulocytes 0 %   Abs Immature Granulocytes 0.05 0.00 - 0.07 K/uL     Constitutional: NAD, AAOx3  HE/ENT: extraocular movements grossly intact, moist mucous membranes CV: RRR PULM: nl respiratory effort, CTABL     Abd: gravid, non-tender, non-distended, soft      Ext: Non-tender, Nonedmeatous   Psych: mood  appropriate, speech normal Pelvic: 3-4/60/-3/soft/posterior -> unchanged from previous exam   NST:  Baseline: 130  Variability: moderate Accelerations present x >2 Decelerations absent Toco: irregular mild contractions  Time 35mins   Assessment: 34 y.o. [redacted]w[redacted]d here for antenatal surveillance during pregnancy.  Principle diagnosis: Constipation in pregnancy   Plan:  Labor: not present.   Fetal Wellbeing: Reassuring Cat 1 tracing.  Reactive NST   Bowel regimen of miralax and fleets enema - able to have successful bowel movement following interventions   Continue with Colace TID as a stool softener.  May take Miralax PRN for constipation.   D/c home stable, precautions reviewed, follow-up as scheduled.   Next scheduled appointment with provider is Friday, 10/11/2019.  Instructed to be seen earlier in the week if still feeling uncomfortable by Monday.   ----- Drinda Butts, CNM Certified Nurse Midwife [facility]

## 2019-10-04 NOTE — ED Triage Notes (Signed)
Pt states that she was attempting to have a bm this am after being constipated for the past 2 days, pt is also [redacted] weeks pregnant and reports contractions for the past 2 weeks, pt states this am with pushing to have a bm she started having contractions and then passed some blood in the toilet.

## 2019-10-04 NOTE — Progress Notes (Signed)
Patient called out from bathroom stating she had to push. RN checked patient and she was 4/60/-2. RN reported exam to CNM and told patient to call RN if she got up to the bathroom again. Support person at the bedside.

## 2019-10-04 NOTE — Progress Notes (Signed)
Patient called out from the bathroom after successful bowel movement (after Fleet enema and Miralax) stating that she had urge to push. RN assisted patient to bed to perform vaginal exam. Patient was 4/-2 station. CNM notified of exam and states she will come over to evaluate patient.

## 2019-10-04 NOTE — ED Notes (Signed)
Report given to brittany for L&D obs 4

## 2019-10-04 NOTE — Progress Notes (Signed)
PIV in patient's right wrist present upon arrival from ED. Patient was complaining of it being painful and requested it be removed. I removed it and put pressure on site. Catheter was intact.

## 2019-10-12 ENCOUNTER — Encounter: Payer: Self-pay | Admitting: Obstetrics and Gynecology

## 2019-10-12 ENCOUNTER — Other Ambulatory Visit: Payer: Self-pay

## 2019-10-12 ENCOUNTER — Observation Stay
Admission: EM | Admit: 2019-10-12 | Discharge: 2019-10-12 | Disposition: A | Discharge status: Home / Self Care | Payer: Managed Care, Other (non HMO) | Attending: Obstetrics and Gynecology | Admitting: Obstetrics and Gynecology

## 2019-10-12 DIAGNOSIS — Z9884 Bariatric surgery status: Secondary | ICD-10-CM | POA: Diagnosis not present

## 2019-10-12 DIAGNOSIS — Z0371 Encounter for suspected problem with amniotic cavity and membrane ruled out: Secondary | ICD-10-CM

## 2019-10-12 DIAGNOSIS — Z3A36 36 weeks gestation of pregnancy: Secondary | ICD-10-CM | POA: Diagnosis not present

## 2019-10-12 DIAGNOSIS — O99213 Obesity complicating pregnancy, third trimester: Secondary | ICD-10-CM | POA: Diagnosis not present

## 2019-10-12 DIAGNOSIS — O479 False labor, unspecified: Principal | ICD-10-CM | POA: Insufficient documentation

## 2019-10-12 DIAGNOSIS — O09213 Supervision of pregnancy with history of pre-term labor, third trimester: Secondary | ICD-10-CM | POA: Insufficient documentation

## 2019-10-12 DIAGNOSIS — R102 Pelvic and perineal pain: Secondary | ICD-10-CM | POA: Diagnosis not present

## 2019-10-12 HISTORY — DX: Encounter for suspected problem with amniotic cavity and membrane ruled out: Z03.71

## 2019-10-12 LAB — RUPTURE OF MEMBRANE (ROM)PLUS: Rom Plus: NEGATIVE

## 2019-10-12 NOTE — OB Triage Note (Signed)
Patient presents to L&D with complaints of vaginal pressure/abdominal discomfort and leaking of fluid. She states the vaginal pressure began at 1130 this morning and she noticed a large gush of clear fluid. Patient denies vaginal bleeding and states the contraction pain is not super uncomfortable, but rather the vaginal pressure is causing her most of her discomfort. Patient reports positive fetal movement. Vital signs stable and CNM aware of patient arrival. Order given for RN to check patient cervix and obtain ROM plus. Monitors applied and assessing.

## 2019-10-12 NOTE — Discharge Summary (Signed)
Alexa Hunter is a 34 y.o. female. She is at [redacted]w[redacted]d gestation. Patient's last menstrual period was 02/02/2019. Estimated Date of Delivery: 11/09/19  Prenatal care site: [facility]- unassigned, 4th visit here to [facility] for triage.    Current pregnancy complicated by:  1. Hx preterm birth x 2 at 57 and 34wks, receiving makena weekly on fridays.  2. Hx Gastric Sleeve 3. Obesity 4. Hx elevated GTT- followed by nutrition   Chief complaint: Gush of fluid this morning, increased UCs and pressure, seen at her OB on 6/18.    [id number] at [redacted]w[redacted]d that presents from ED with c/o Vaginal pressure and LOF.  - gush of watery fluid noted at 1130 this am.   - Pt sees [facility] for her prenatal care as she works there but stated "I live right [place] the road and wanted to be sure I was okay." Pt states she has a history of preterm delivery one at [redacted] weeks gestation and one at [redacted] weeks gestation. PT is currently getting Makena shots every week on Fridays. Pt was given BMZ doses on 5/26, 5/27 due to ctx and her history  S: Resting comfortably. no CTX, no VB. Active fetal movement. Denies: HA, visual changes, SOB, or RUQ/epigastric pain   Maternal Medical History:   Past Medical History:  Diagnosis Date  . Abnormal Pap smear of cervix   . Asthma   . Contact dermatitis     Past Surgical History:  Procedure Laterality Date  . CHOLECYSTECTOMY  2013  . 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 none      Allergies  Allergen Reactions  . Flagyl [Metronidazole] Nausea And Vomiting    Oral flagyl only    Prior to Admission medications   Medication Sig Start Date End Date Taking? Authorizing Provider  escitalopram (LEXAPRO) 10 MG tablet 1/2 tab po QD x 6 days, then whole tab daily Patient not taking: Reported on 09/25/2019 12/27/18   Hali Marry, MD  Multiple Vitamin (MULTIVITAMIN) capsule Take by mouth.    [provider]  VITAMIN D PO Vitamin D    [provider]       Social History: She  reports that she has never smoked. She has never used smokeless tobacco. She reports that she does not drink alcohol and does not use drugs.  Family History: family history includes Diabetes in her maternal grandmother and sister; Hypertension in her mother; Migraines in her mother.   Review of Systems: A full review of systems was performed and negative except as noted in the HPI.     O:  BP 101/65 (BP Location: Left Arm)   Pulse 100   Temp 97.6 F (36.4 C)   Resp 16   Ht 5\' 4"  (1.626 m)   Wt 104.3 kg   LMP 02/02/2019   BMI 39.48 kg/m  Results for orders placed or performed during the hospital encounter of 10/12/19 (from the past 48 hour(s))  ROM Plus ([place] only)   Collection Time: 10/12/19  1:38 PM  Result Value Ref Range   Rom Plus NEGATIVE      Constitutional: NAD, AAOx3  HE/ENT: extraocular movements grossly intact, moist mucous membranes CV: RRR PULM: nl respiratory effort, CTABL     Abd: gravid, non-tender, non-distended, soft      Ext: Non-tender, Nonedematous   Psych: mood appropriate, speech normal Pelvic: SSE done:mod amount thick white vaginal DC noted. Cervix vis dilated. Negative pooling, Wet prep sent  SVE: 3-4/75/-2, soft/posterior -  no cervical change since last exam per RN exam.     Fetal  monitoring: Cat I Appropriate for GA Baseline: 130bpm Variability: moderate Accelerations: present x >2 Decelerations absent Time  TOCO: occasional UCs, mild uterine irritability noted    A/P: 34 y.o. 34w0dhere for antenatal surveillance for leaking of fluid, preterm UCs.   Principle Diagnosis:  High risk pregnancy in third trimester    Preterm labor: not present, no cervical change   Fetal Wellbeing: Reassuring Cat 1 tracing with reactive NST   ROM Plus negative  D/c home stable, precautions reviewed, follow-up as scheduled with [facility] or may consider transfer to [facility] if she plans to deliver here at [facility].    Randa Ngo, CNM 10/12/2019  3:43 PM

## 2019-10-12 NOTE — Discharge Instructions (Signed)
Rupture of membranes test is negative. If you feel regular contractions >5 in the hour before 37 weeks, fluid leaking, vaginal bleeding, or baby not moving then call your practitioner and come back in.

## 2019-10-18 LAB — OB RESULTS CONSOLE GBS: GBS: NEGATIVE

## 2019-10-22 ENCOUNTER — Encounter: Payer: Self-pay | Admitting: Obstetrics and Gynecology

## 2019-10-22 ENCOUNTER — Inpatient Hospital Stay
Admission: EM | Admit: 2019-10-22 | Discharge: 2019-10-24 | DRG: 806 | Disposition: A | Discharge status: Home / Self Care | Payer: Managed Care, Other (non HMO) | Attending: Certified Nurse Midwife | Admitting: Certified Nurse Midwife

## 2019-10-22 DIAGNOSIS — O99844 Bariatric surgery status complicating childbirth: Secondary | ICD-10-CM | POA: Diagnosis present

## 2019-10-22 DIAGNOSIS — Z20822 Contact with and (suspected) exposure to covid-19: Secondary | ICD-10-CM | POA: Diagnosis present

## 2019-10-22 DIAGNOSIS — Z3A37 37 weeks gestation of pregnancy: Secondary | ICD-10-CM

## 2019-10-22 DIAGNOSIS — O26893 Other specified pregnancy related conditions, third trimester: Secondary | ICD-10-CM | POA: Diagnosis not present

## 2019-10-22 DIAGNOSIS — O9081 Anemia of the puerperium: Secondary | ICD-10-CM | POA: Diagnosis not present

## 2019-10-22 DIAGNOSIS — D62 Acute posthemorrhagic anemia: Secondary | ICD-10-CM | POA: Diagnosis not present

## 2019-10-22 DIAGNOSIS — Z419 Encounter for procedure for purposes other than remedying health state, unspecified: Secondary | ICD-10-CM | POA: Diagnosis not present

## 2019-10-22 LAB — CBC
HCT: 31.9 % — ABNORMAL LOW (ref 36.0–46.0)
Hemoglobin: 10 g/dL — ABNORMAL LOW (ref 12.0–15.0)
MCH: 23.5 pg — ABNORMAL LOW (ref 26.0–34.0)
MCHC: 31.3 g/dL (ref 30.0–36.0)
MCV: 75.1 fL — ABNORMAL LOW (ref 80.0–100.0)
Platelets: 325 10*3/uL (ref 150–400)
RBC: 4.25 MIL/uL (ref 3.87–5.11)
RDW: 16.5 % — ABNORMAL HIGH (ref 11.5–15.5)
WBC: 14.9 10*3/uL — ABNORMAL HIGH (ref 4.0–10.5)
nRBC: 0 % (ref 0.0–0.2)

## 2019-10-22 LAB — TYPE AND SCREEN
ABO/RH(D): O POS
Antibody Screen: NEGATIVE

## 2019-10-22 LAB — SARS CORONAVIRUS 2 BY RT PCR (HOSPITAL ORDER, PERFORMED IN ~~LOC~~ HOSPITAL LAB): SARS Coronavirus 2: NEGATIVE

## 2019-10-22 MED ORDER — OXYTOCIN 10 UNIT/ML IJ SOLN
INTRAMUSCULAR | Status: AC
  Filled 2019-10-22: qty 2

## 2019-10-22 MED ORDER — LIDOCAINE HCL (PF) 1 % IJ SOLN
INTRAMUSCULAR | Status: AC
  Filled 2019-10-22: qty 30

## 2019-10-22 MED ORDER — ACETAMINOPHEN 325 MG PO TABS: 650.0000 mg | ORAL_TABLET | ORAL | Status: DC | PRN

## 2019-10-22 MED ORDER — ONDANSETRON HCL 4 MG/2ML IJ SOLN
4.0000 mg | Freq: Four times a day (QID) | INTRAMUSCULAR | Status: DC | PRN
  Administered 2019-10-23: 4 mg via INTRAVENOUS
  Filled 2019-10-22: qty 2

## 2019-10-22 MED ORDER — LACTATED RINGERS IV SOLN
500.0000 mL | INTRAVENOUS | Status: DC | PRN
  Administered 2019-10-22: 500 mL via INTRAVENOUS

## 2019-10-22 MED ORDER — LIDOCAINE HCL (PF) 1 % IJ SOLN: 30.0000 mL | INTRAMUSCULAR | Status: DC | PRN

## 2019-10-22 MED ORDER — MISOPROSTOL 200 MCG PO TABS
ORAL_TABLET | ORAL | Status: AC
  Filled 2019-10-22: qty 4

## 2019-10-22 MED ORDER — TERBUTALINE SULFATE 1 MG/ML IJ SOLN
INTRAMUSCULAR | Status: AC
  Filled 2019-10-22: qty 1

## 2019-10-22 MED ORDER — BUTORPHANOL TARTRATE 1 MG/ML IJ SOLN
1.0000 mg | INTRAMUSCULAR | Status: DC | PRN
  Administered 2019-10-23: 1 mg via INTRAVENOUS
  Filled 2019-10-22: qty 1

## 2019-10-22 MED ORDER — SOD CITRATE-CITRIC ACID 500-334 MG/5ML PO SOLN: 30.0000 mL | ORAL | Status: DC | PRN

## 2019-10-22 MED ORDER — OXYTOCIN BOLUS FROM INFUSION
333.0000 mL | Freq: Once | INTRAVENOUS | Status: AC
  Administered 2019-10-23: 333 mL via INTRAVENOUS

## 2019-10-22 MED ORDER — TERBUTALINE SULFATE 1 MG/ML IJ SOLN: 0.2500 mg | Freq: Once | INTRAMUSCULAR | Status: DC | PRN

## 2019-10-22 MED ORDER — AMMONIA AROMATIC IN INHA
RESPIRATORY_TRACT | Status: AC
  Filled 2019-10-22: qty 10

## 2019-10-22 MED ORDER — OXYTOCIN-SODIUM CHLORIDE 30-0.9 UT/500ML-% IV SOLN
1.0000 m[IU]/min | INTRAVENOUS | Status: DC
  Administered 2019-10-22: 2 m[IU]/min via INTRAVENOUS
  Filled 2019-10-22: qty 500

## 2019-10-22 MED ORDER — LACTATED RINGERS IV SOLN: INTRAVENOUS | Status: DC

## 2019-10-22 MED ORDER — OXYTOCIN-SODIUM CHLORIDE 30-0.9 UT/500ML-% IV SOLN: 2.5000 [IU]/h | INTRAVENOUS | Status: DC

## 2019-10-22 MED ORDER — OXYTOCIN-SODIUM CHLORIDE 30-0.9 UT/500ML-% IV SOLN
INTRAVENOUS | Status: AC
  Filled 2019-10-22: qty 500

## 2019-10-22 NOTE — H&P (Signed)
OB History & Physical   History of Present Illness:  Chief Complaint: contractions  HPI:  Alexa Hunter is a 34 y.o. [phone] female at 104w3d dated by LMP c/w [redacted]w[redacted]d ultrasound.  She presents to L&D for contractions that started around 2000 today, currently every 4 minutes.   She reports:  -active fetal movement -LOF/SROM after arrival to L&D triage -no vaginal bleeding  Pregnancy Issues: 1. Hx preterm birth x 2 at 49 and 34wks, receiving Makena 2. Hx Gastric Sleeve 3. Prepregnancy BMI 37    Maternal Medical History:   Past Medical History:  Diagnosis Date  . Abnormal Pap smear of cervix   . Asthma   . Contact dermatitis     Past Surgical History:  Procedure Laterality Date  . CHOLECYSTECTOMY  2013  . 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 none      Allergies  Allergen Reactions  . Flagyl [Metronidazole] Nausea And Vomiting    Oral flagyl only    Prior to Admission medications   Medication Sig Start Date End Date Taking? Authorizing Provider  escitalopram (LEXAPRO) 10 MG tablet 1/2 tab po QD x 6 days, then whole tab daily Patient not taking: Reported on 09/25/2019 12/27/18   Agapito Games, MD  Multiple Vitamin (MULTIVITAMIN) capsule Take by mouth.    [provider]  terconazole (TERAZOL 7) 0.4 % vaginal cream Place 1 applicator vaginally at bedtime. 10/01/19   McVey, Prudencio Pair, CNM  VITAMIN D PO Vitamin D    [provider]     Prenatal care site: [facility]--unassigned patient  Social History: She  reports that she has never smoked. She has never used smokeless tobacco. She reports that she does not drink alcohol and does not use drugs.  Family History: family history includes Diabetes in her maternal grandmother and sister; Hypertension in her mother; Migraines in her mother.   Review of Systems: A full review of systems was performed and negative except as noted in the HPI.    Physical Exam:  Vital Signs: BP 119/67 (BP Location: Left  Arm)   Pulse 80   Temp 97.6 F (36.4 C) (Oral)   Resp 18   Ht 5\' 4"  (1.626 m)   Wt 103 kg   LMP 02/02/2019   BMI 38.96 kg/m   General:   alert, cooperative, appears stated age and mild distress  Skin:  normal and no rash or abnormalities  Neurologic:    Alert & oriented x 3  Lungs:   clear to auscultation bilaterally  Heart:   regular rate and rhythm, S1, S2 normal, no murmur, click, rub or gallop  Abdomen:  soft, non-tender; bowel sounds normal; no masses,  no organomegaly  Pelvis:  External genitalia: normal general appearance  FHT:  135 BPM  Presentations: cephalic  Cervix:    Dilation: 5cm   Effacement: 70%   Station:  -1   Consistency: soft   Position: posterior  Extremities: : non-tender, symmetric, no edema bilaterally.     EFW: 6lb2oz  No results found for this or any previous visit (from the past 24 hour(s)).  Pertinent Results:  Prenatal Labs: Blood type/Rh O+  Antibody screen neg  Rubella Immune  Varicella pending  RPR NR  HBsAg Neg  HIV NR  GC neg  Chlamydia neg  Genetic screening Not done  1 hour GTT 119  3 hour GTT n/a  GBS negative   FHT: FHR: 135 bpm, variability: moderate,  accelerations:  Present,  decelerations:  Absent Category/reactivity:  Category I TOCO: irregular, every 4-8 minutes  Assessment:  Alexa Hunter is a 34 y.o. [phone] female at [redacted]w[redacted]d with labor.   Plan:  1. Admit to Labor & Delivery; consents reviewed and obtained  2. Fetal Well being  - Fetal Tracing: category I - GBS negative - Presentation: cephalic confirmed by vaginal exam   3. Routine OB: - Prenatal labs reviewed, as above - Rh positive - CBC & T&S on admit - Clear fluids, IVF  4. Monitoring of Labor: -  Contractions monitored with external toco in place -  Pelvis proven to 2040g -  Plan for augmentation with pitocin -  Plan for continuous fetal monitoring  -  Maternal pain control as desired: IVPM, nitrous, regional anesthesia -  Anticipate vaginal delivery  5. Post Partum Planning: - Infant feeding: breast, issues with supply during past pregnancies - Contraception: TBD  Genia Del, CNM 10/22/2019 9:45 PM ----- Genia Del Certified Nurse Midwife [facility], Department of OB/GYN [facility]

## 2019-10-23 ENCOUNTER — Encounter: Payer: Self-pay | Admitting: Obstetrics and Gynecology

## 2019-10-23 ENCOUNTER — Other Ambulatory Visit: Payer: Self-pay

## 2019-10-23 LAB — CBC
HCT: 28.8 % — ABNORMAL LOW (ref 36.0–46.0)
Hemoglobin: 9.1 g/dL — ABNORMAL LOW (ref 12.0–15.0)
MCH: 23.5 pg — ABNORMAL LOW (ref 26.0–34.0)
MCHC: 31.6 g/dL (ref 30.0–36.0)
MCV: 74.4 fL — ABNORMAL LOW (ref 80.0–100.0)
Platelets: 265 10*3/uL (ref 150–400)
RBC: 3.87 MIL/uL (ref 3.87–5.11)
RDW: 16.3 % — ABNORMAL HIGH (ref 11.5–15.5)
WBC: 19.8 10*3/uL — ABNORMAL HIGH (ref 4.0–10.5)
nRBC: 0 % (ref 0.0–0.2)

## 2019-10-23 LAB — COMPREHENSIVE METABOLIC PANEL
ALT: 11 U/L (ref 0–44)
AST: 25 U/L (ref 15–41)
Albumin: 2.2 g/dL — ABNORMAL LOW (ref 3.5–5.0)
Alkaline Phosphatase: 119 U/L (ref 38–126)
Anion gap: 9 (ref 5–15)
BUN: 6 mg/dL (ref 6–20)
CO2: 19 mmol/L — ABNORMAL LOW (ref 22–32)
Calcium: 8.4 mg/dL — ABNORMAL LOW (ref 8.9–10.3)
Chloride: 109 mmol/L (ref 98–111)
Creatinine, Ser: 0.6 mg/dL (ref 0.44–1.00)
GFR calc Af Amer: >60 mL/min (ref >60)
GFR calc non Af Amer: >60 mL/min (ref >60)
Glucose, Bld: 116 mg/dL — ABNORMAL HIGH (ref 70–99)
Potassium: 3.7 mmol/L (ref 3.5–5.1)
Sodium: 137 mmol/L (ref 135–145)
Total Bilirubin: 0.7 mg/dL (ref 0.3–1.2)
Total Protein: 5.7 g/dL — ABNORMAL LOW (ref 6.5–8.1)

## 2019-10-23 LAB — RPR: RPR Ser Ql: NONREACTIVE

## 2019-10-23 MED ORDER — DIPHENHYDRAMINE HCL 25 MG PO CAPS: 25.0000 mg | ORAL_CAPSULE | Freq: Four times a day (QID) | ORAL | Status: DC | PRN

## 2019-10-23 MED ORDER — SENNOSIDES-DOCUSATE SODIUM 8.6-50 MG PO TABS
2.0000 | ORAL_TABLET | ORAL | Status: DC
  Administered 2019-10-24: 2 via ORAL
  Filled 2019-10-23: qty 2

## 2019-10-23 MED ORDER — IBUPROFEN 600 MG PO TABS
600.0000 mg | ORAL_TABLET | Freq: Four times a day (QID) | ORAL | Status: DC
  Administered 2019-10-23: 600 mg via ORAL
  Filled 2019-10-23: qty 1

## 2019-10-23 MED ORDER — FERROUS SULFATE 325 (65 FE) MG PO TABS
325.0000 mg | ORAL_TABLET | Freq: Two times a day (BID) | ORAL | Status: DC
  Administered 2019-10-23 – 2019-10-24 (×3): 325 mg via ORAL
  Filled 2019-10-23 (×3): qty 1

## 2019-10-23 MED ORDER — ONDANSETRON HCL 4 MG PO TABS: 4.0000 mg | ORAL_TABLET | ORAL | Status: DC | PRN

## 2019-10-23 MED ORDER — DIBUCAINE (PERIANAL) 1 % EX OINT: 1.0000 "application " | TOPICAL_OINTMENT | CUTANEOUS | Status: DC | PRN

## 2019-10-23 MED ORDER — LACTATED RINGERS IV BOLUS
500.0000 mL | Freq: Once | INTRAVENOUS | Status: AC
  Administered 2019-10-23: 500 mL via INTRAVENOUS

## 2019-10-23 MED ORDER — PRENATAL MULTIVITAMIN CH
1.0000 | ORAL_TABLET | Freq: Every day | ORAL | Status: DC
  Administered 2019-10-23: 1 via ORAL
  Filled 2019-10-23: qty 1

## 2019-10-23 MED ORDER — WITCH HAZEL-GLYCERIN EX PADS: 1.0000 "application " | MEDICATED_PAD | CUTANEOUS | Status: DC

## 2019-10-23 MED ORDER — SIMETHICONE 80 MG PO CHEW: 160.0000 mg | CHEWABLE_TABLET | ORAL | Status: DC | PRN

## 2019-10-23 MED ORDER — COCONUT OIL OIL: 1.0000 "application " | TOPICAL_OIL | Status: DC | PRN

## 2019-10-23 MED ORDER — LACTATED RINGERS IV SOLN: INTRAVENOUS | Status: DC

## 2019-10-23 MED ORDER — ACETAMINOPHEN 325 MG PO TABS
650.0000 mg | ORAL_TABLET | ORAL | Status: DC | PRN
  Administered 2019-10-23 (×2): 650 mg via ORAL
  Filled 2019-10-23 (×2): qty 2

## 2019-10-23 MED ORDER — ONDANSETRON HCL 4 MG/2ML IJ SOLN: 4.0000 mg | INTRAMUSCULAR | Status: DC | PRN

## 2019-10-23 MED ORDER — BENZOCAINE-MENTHOL 20-0.5 % EX AERO: 1.0000 "application " | INHALATION_SPRAY | CUTANEOUS | Status: DC | PRN

## 2019-10-23 NOTE — Progress Notes (Signed)
Labor Progress Note  Alexa Hunter is a 34 y.o. [id number] at [redacted]w[redacted]d by LMP admitted for rupture of membranes.   Subjective:  Patient feeling stronger contractions.   Objective: BP 105/85 (BP Location: Right Arm)   Pulse 70   Temp (!) 97.5 F (36.4 C) (Oral)   Resp 20   Ht 5\' 4"  (1.626 m)   Wt 103 kg   LMP 02/02/2019   SpO2 100%   BMI 38.96 kg/m   Fetal Assessment: FHT:  FHR: 135 bpm, variability: moderate,  accelerations:  Present,  decelerations:  Absent Category/reactivity:  Category I UC:   regular, every 4-6 minutes SVE:    Dilation: 7cm  Effacement: 80%  Station:  -1  Consistency: soft  Position: middle  Membrane status: SROM 2115, AROM forebag 2358 Amniotic color: clear  Labs: Lab Results  Component Value Date   WBC 14.9 (H) 10/22/2019   HGB 10.0 (L) 10/22/2019   HCT 31.9 (L) 10/22/2019   MCV 75.1 (L) 10/22/2019   PLT 325 10/22/2019    Assessment / Plan: Augmentation of labor, progressing well  Labor: Pitocin started at 2300, stopped at 2354 due to prolonged deceleration. IUPC in place. Contractions have spaced to every 4-6 minutes. Will restart pitocin now and titrate per protocol to adequate MVUs.  Fetal Wellbeing:  Category I  Pain Control:  Maternal pain control as desired: IVPM, nitrous, regional anesthesia I/D:  n/a Anticipated MOD:  NSVD    10/24/2019, CNM 10/23/2019, 1:18 AM

## 2019-10-23 NOTE — Progress Notes (Signed)
CNM Haviland notified of patients low BP, New orders given. Implemented and will continue with plan care.

## 2019-10-23 NOTE — Progress Notes (Signed)
Labor Progress Note  Alexa Hunter is a 34 y.o. [id number] at [redacted]w[redacted]d by LMP admitted for rupture of membranes.   Subjective:  Patient feeling stronger contractions  Objective: BP 105/85 (BP Location: Right Arm)   Pulse 70   Temp (!) 97.5 F (36.4 C) (Oral)   Resp 20   Ht 5\' 4"  (1.626 m)   Wt 103 kg   LMP 02/02/2019   SpO2 100%   BMI 38.96 kg/m   Fetal Assessment: FHT:  FHR: 135 bpm, variability: moderate,  accelerations:  Present,  decelerations:  Present prolonged Category/reactivity:  Category II UC:   regular, every 2-4 minutes SVE:    Dilation: 6cm  Effacement: 80%  Station:  -1  Consistency: soft  Position: middle  Membrane status: SROM 2115, AROM forebag now Amniotic color: clear  Labs: Lab Results  Component Value Date   WBC 14.9 (H) 10/22/2019   HGB 10.0 (L) 10/22/2019   HCT 31.9 (L) 10/22/2019   MCV 75.1 (L) 10/22/2019   PLT 325 10/22/2019    Assessment / Plan: Augmentation of labor, progressing well  Labor: Pitocin started at 2300, stopped at 2354 due to prolonged deceleration. IUPC placed. MVUs off pitocin currently 210. Will restart pitocin as needed to maintain adequate MVUs.  Fetal Wellbeing:  Category II for prolonged decel, overall reassuring Pain Control:  Maternal pain control as desired: IVPM, nitrous, regional anesthesia I/D:  n/a Anticipated MOD:  NSVD    Called to patient's bedside for 4-6 minute prolonged deceleration with nadir of 60bpm and episode of hypotension. IV fluid bolus running, pitocin stopped, and patient in hands and knees position when I arrived to the room. Due to difficulty tracing FHT with external monitor, patient was repositioned onto her back and IUPC and FSE were placed. FHT at that point recovered in the 150s. Category I tracing now for the past 30 minutes, with normal baseline, moderate variability, 15x15 accels, and no further decels. Strong and regular contractions. Continue to monitor closely, restarting  pitocin as needed to maintain adequate MVUs.   10/24/2019, CNM 10/23/2019, 12:24 AM

## 2019-10-23 NOTE — Progress Notes (Signed)
Post Partum day of delivery  Subjective: no complaints, up ad lib, voiding and tolerating PO  Doing well, no concerns. Ambulating without difficulty, pain managed with PO meds, tolerating regular diet, and voiding without difficulty.   No fever/chills, chest pain, shortness of breath, nausea/vomiting, or leg pain. No nipple or breast pain. No headache, visual changes, or RUQ/epigastric pain.  Objective: BP (!) 103/53 (BP Location: Right Arm)   Pulse (!) 58   Temp 97.7 F (36.5 C) (Oral)   Resp 18   Ht 5\' 4"  (1.626 m)   Wt 103 kg   LMP 02/02/2019   SpO2 96%   Breastfeeding Unknown   BMI 38.96 kg/m    Physical Exam:  General: alert, cooperative and no distress Breasts: soft/nontender CV: RRR Pulm: nl effort, CTABL Abdomen: soft, non-tender, active bowel sounds Uterine Fundus: firm Perineum: minimal edema Lochia: appropriate DVT Evaluation: No evidence of DVT seen on physical exam.  Recent Labs    10/22/19 2145 10/23/19 0918  HGB 10.0* 9.1*  HCT 31.9* 28.8*  WBC 14.9* 19.8*  PLT 325 265    Assessment/Plan: 34 y.o. 31 postpartum day # day of delivery   -Continue routine postpartum care -Lactation consult PRN for breastfeeding -Discussed contraceptive options including implant, IUDs hormonal and non-hormonal, injection, pills/ring/patch, condoms, and NFP. - desires BTL -Discussed BTL with Dr. [id number].  Provider to evaluate for postpartum BTL versus returning in 4-6 weeks.  -Low b/p's noted after delivery, pulse WNL.  Able to ambulate without vertigo or SOB, asymptomatic.  Feeling less fatigued after Dalbert Garnet LR IV bolus and resting for 1-2 hours.  -Acute blood loss anemia - hemodynamically stable and asymptomatic; start PO ferrous sulfate BID with stool softeners   Disposition: Continue inpatient postpartum care  LOS: 1 day   , CNM 10/23/2019, 1:35 PM   ----- 10/25/2019  Certified Nurse Midwife [facility]

## 2019-10-23 NOTE — Discharge Summary (Signed)
Obstetric Discharge Summary   Patient Name: Alexa Hunter DOB: Feb 08, 1986 MRN: [id number]  Date of Admission: 10/22/2019 Date of Delivery: 10/23/2019 Delivered by: Genia Del, CNM Date of Discharge: 10/24/2019  Primary OB: [facility] [id number] last menstrual period was 02/02/2019. EDC Estimated Date of Delivery: 11/09/19 Gestational Age at Delivery: [redacted]w[redacted]d   Antepartum complications:  1. Hx preterm birth x 2 at 67 and 34wks, receiving Makena 2. Hx Gastric Sleeve 3. Prepregnancy BMI 37  Admitting Diagnosis: labor  Secondary Diagnoses: Patient Active Problem List   Diagnosis Date Noted  . NSVD (normal spontaneous vaginal delivery) 10/24/2019  . Acute blood loss anemia 10/24/2019  . Encounter for suspected PROM, with rupture of membranes not found 10/12/2019  . Constipation 10/04/2019  . Preterm uterine contractions in third trimester, antepartum 10/01/2019  . Preterm uterine contractions, antepartum, third trimester 09/25/2019  . Acute depression 12/27/2018  . Iron deficiency anemia 06/07/2017  . Vitamin D deficiency 06/06/2017  . Preterm premature rupture of membranes 02/25/2017  . Pregnancy affected by fetal growth restriction 02/22/2017  . Labor and delivery, indication for care 02/20/2017  . History of preterm delivery, currently pregnant in third trimester 02/20/2017  . Abnormal glucose tolerance in pregnancy 12/12/2016  . 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 History of miscarriage 08/10/2016  . History of preterm delivery 08/10/2016  . 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 Adnexal cyst 11/24/2015  . Mild intermittent asthma without complication 08/28/2015  . History of depression 05/13/2015  . Plantar fasciitis, right 03/14/2014  . History of abnormal cervical Pap smear 05/21/2013  . Eczema 12/10/2010  . HYPERLIPIDEMIA 03/23/2010  . OBESITY 03/23/2010  . MIGRAINE HEADACHE 08/26/2008  . 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 RHINITIS, ALLERGIC NOS  03/17/2006  . ASTHMA 03/17/2006  . Asthma 03/17/2006    Augmentation: AROM and Pitocin Complications: None Intrapartum complications/course:  Delivery Type: spontaneous vaginal delivery Anesthesia: none Placenta: spontaneous Laceration: b/l labial abrasions Episiotomy: none  Newborn Data: Live born female Birth Weight:  2880g, 6lb5.6oz APGAR: 8, 9   Newborn Delivery   Birth date/time: 10/23/2019 02:51:00 Delivery type: Vaginal, Spontaneous       Postpartum Course  Patient had an uncomplicated postpartum course.  By time of discharge on PPD#1, her pain was controlled on oral pain medications; she had appropriate lochia and was ambulating, voiding without difficulty and tolerating regular diet.  She was deemed stable for discharge to home.       Edinburgh:   Edinburgh Postnatal Depression Scale Screening Tool 10/23/2019 10/23/2019 04/07/2017  I have been able to laugh and see the funny side of things. 0 (No Data) 0  I have looked forward with enjoyment to things. 0 - 0  I have blamed myself unnecessarily when things went wrong. 1 - 2  I have been anxious or worried for no good reason. 1 - 2  I have felt scared or panicky for no good reason. 1 - 1  Things have been getting on top of me. 1 - 1  I have been so unhappy that I have had difficulty sleeping. 0 - 0  I have felt sad or miserable. 1 - 1  I have been so unhappy that I have been crying. 0 - 1  The thought of harming myself has occurred to me. 0 - 0  Edinburgh Postnatal Depression Scale Total 5 - 8     Labs: CBC Latest Ref Rng & Units 10/24/2019 10/23/2019 10/22/2019  WBC 4.0 - 10.5 K/uL 14.4(H) 19.8(H) 14.9(H)  Hemoglobin 12.0 - 15.0  g/dL 4.2(V) 9.5(G) 10.0(L)  Hematocrit 36 - 46 % 29.2(L) 28.8(L) 31.9(L)  Platelets 150 - 400 K/uL 255 265 325   O POS  Physical exam:  BP 92/63 (BP Location: Right Arm)   Pulse (!) 57   Temp 98.2 F (36.8 C) (Oral)   Resp 18   Ht 5\' 4"  (1.626 m)   Wt 103 kg   LMP 02/02/2019    SpO2 96%   Breastfeeding Unknown   BMI 38.96 kg/m  General: alert and no distress Pulm: normal respiratory effort Lochia: appropriate Abdomen: soft, NT Uterine Fundus: firm, below umbilicus Extremities: No evidence of DVT seen on physical exam. No lower extremity edema.   Disposition: stable, discharge to home Baby Feeding: formula Baby Disposition: home with mom  Contraception: BTL - unable to do during admission, Consent papers signed 6/10  Prenatal Labs:  Blood type/Rh O+  Antibody screen neg  Rubella Immune  Varicella Still pending at discharge  RPR NR  HBsAg Neg  HIV NR  GC neg  Chlamydia neg  Genetic screening negative  1 hour GTT 119  3 hour GTT n/a  GBS negative   Rh Immune globulin given: n/a Rubella vaccine given: n/a Varicella vaccine given: awaiting results Tdap vaccine given in AP or PP setting: 09/06/2019  Plan:  09/08/2019 was discharged to home in good condition. Follow-up appointment at [facility] with delivery provider in 6 weeks  Discharge Instructions: Per After Visit Summary. Activity: Advance as tolerated. Pelvic rest for 6 weeks.   Diet: Regular Discharge Medications: Allergies as of 10/24/2019      Reactions   Flagyl [metronidazole] Nausea And Vomiting   Oral flagyl only      Medication List    STOP taking these medications   escitalopram 10 MG tablet Commonly known as: Lexapro   terconazole 0.4 % vaginal cream Commonly known as: Terazol 7   VITAMIN D PO     TAKE these medications   acetaminophen 325 MG tablet Commonly known as: Tylenol Take 2 tablets (650 mg total) by mouth every 6 (six) hours as needed (for pain scale < 4).   ferrous sulfate 325 (65 FE) MG tablet Take 1 tablet (325 mg total) by mouth daily with breakfast.   ibuprofen 600 MG tablet Commonly known as: ADVIL Take 1 tablet (600 mg total) by mouth every 6 (six) hours as needed.   multivitamin capsule Take by mouth.       Outpatient follow up:   Follow-up Information    12/25/2019, CNM. Schedule an appointment as soon as possible for a visit in 6 week(s).   Specialty: Certified Nurse Midwife Why: For routine postpartum visit Contact information: [address] [place] [phone]                Signed: [phone] 10/24/19 7:53 AM

## 2019-10-24 DIAGNOSIS — D62 Acute posthemorrhagic anemia: Secondary | ICD-10-CM | POA: Diagnosis not present

## 2019-10-24 LAB — CBC
HCT: 29.2 % — ABNORMAL LOW (ref 36.0–46.0)
Hemoglobin: 9.1 g/dL — ABNORMAL LOW (ref 12.0–15.0)
MCH: 23.6 pg — ABNORMAL LOW (ref 26.0–34.0)
MCHC: 31.2 g/dL (ref 30.0–36.0)
MCV: 75.6 fL — ABNORMAL LOW (ref 80.0–100.0)
Platelets: 255 10*3/uL (ref 150–400)
RBC: 3.86 MIL/uL — ABNORMAL LOW (ref 3.87–5.11)
RDW: 16.5 % — ABNORMAL HIGH (ref 11.5–15.5)
WBC: 14.4 10*3/uL — ABNORMAL HIGH (ref 4.0–10.5)
nRBC: 0 % (ref 0.0–0.2)

## 2019-10-24 LAB — VARICELLA ZOSTER ANTIBODY, IGG: Varicella IgG: 2340 index (ref >165)

## 2019-10-24 MED ORDER — FERROUS SULFATE 325 (65 FE) MG PO TABS: 325.0000 mg | ORAL_TABLET | Freq: Every day | ORAL | 3 refills | Status: DC

## 2019-10-24 MED ORDER — IBUPROFEN 600 MG PO TABS
600.0000 mg | ORAL_TABLET | Freq: Four times a day (QID) | ORAL | Status: DC | PRN
Start: 2019-10-24 — End: 2021-03-25

## 2019-10-24 MED ORDER — ACETAMINOPHEN 325 MG PO TABS: 650.0000 mg | ORAL_TABLET | Freq: Four times a day (QID) | ORAL | Status: DC | PRN

## 2019-10-24 NOTE — Progress Notes (Signed)
Pt discharged with infant. Discharge instructions, prescriptions, and follow up appointments given to and reviewed with patient. Pt verbalized understanding. Escorted out by staff. 

## 2019-10-24 NOTE — Plan of Care (Signed)
Patient remains on continuous IV fluids for low BP's; fundus firm; small amount rubra lochia; good appetite; good po fluids; voiding; good maternal-infant bonding observed; bottle feeding infant.

## 2019-10-24 NOTE — Discharge Instructions (Signed)
After Your Delivery Discharge Instructions   Postpartum: Care Instructions  After childbirth (postpartum period), your body goes through many changes. Some of these changes happen over several weeks. In the hours after delivery, your body will begin to recover from childbirth while it prepares to breastfeed your newborn. You may feel emotional during this time. Your hormones can shift your mood without warning for no clear reason.  In the first couple of weeks after childbirth, many women have emotions that change from happy to sad. You may find it hard to sleep. You may cry a lot. This is called the "baby blues." These overwhelming emotions often go away within a couple of days or weeks. But it's important to discuss your feelings with your doctor.  You should call your care provider if you have unrelieved feelings of:  Inability to cope  Sadness  Anxiety  Lack of interest in baby  Insomnia  Crying  It is easy to get too tired and overwhelmed during the first weeks after childbirth. Don't try to do too much. Get rest whenever you can, accept help from others, and eat well and drink plenty of fluids.  About 4 to 6 weeks after your baby's birth, you will have a follow-up visit with your care provider. This visit is your time to talk to your provider about anything you are concerned or curious about.  Follow-up care is a key part of your treatment and safety. Be sure to make and go to all appointments, and call your doctor if you are having problems. It's also a good idea to know your test results and keep a list of the medicines you take.  How can you care for yourself at home?  Sleep or rest when your baby sleeps.  Get help with household chores from family or friends, if you can. Do not try to do it all yourself.  If you have hemorrhoids or swelling or pain around the opening of your vagina, try using cold and heat. You can put ice or a cold pack on the area for 10 to 20 minutes at  a time. Put a thin cloth between the ice and your skin. Also try sitting in a few inches of warm water (sitz bath) 3 times a day and after bowel movements.  Take pain medicines exactly as directed.  If the provider gave you a prescription medicine for pain, take it as prescribed.  If you do not have a prescription and need something over the counter, you can take:  Ibuprofen (Motrin, Advil) up to 600mg every 6 hours as needed for pain  Acetaminophen (Tylenol) up to 650mg every 4 hours as needed for pain  Some people find it helpful to alternate between these two medications.   No driving for 1-2 weeks or while taking pain medications.   Eat more fiber to avoid constipation. Include foods such as whole-grain breads and cereals, raw vegetables, raw and dried fruits, and beans.  Drink plenty of fluids, enough so that your urine is light yellow or clear like water. If you have kidney, heart, or liver disease and have to limit fluids, talk with your doctor before you increase the amount of fluids you drink.  Do not put anything in the vagina for 6 weeks. This means no sex, no tampons, no douching, and no enemas.  If you have stitches, keep the area clean by pouring or spraying warm water over the area outside your vagina and anus after you use the toilet.    No strenuous activity or heavy lifting for 6 weeks   No tub baths; showers only  Continue prenatal vitamin and iron.  If breastfeeding:  Increase calories and fluids while breastfeeding.  You may have a slight fever when your milk comes in, but it should go away on its own. If it does not, and rises above 101.0 please call the doctor.  For breastfeeding concerns, the lactation consultant can be reached at [phone].  For concerns about your baby, please call your pediatrician.   Keep a list of questions to bring to your postpartum visit. Your questions might be about:  Changes in your breasts, such as lumps or  soreness.  When to expect your menstrual period to start again.  What form of birth control is best for you.  Weight you have put on during the pregnancy.  Exercise options.  What foods and drinks are best for you, especially if you are breastfeeding.  Problems you might be having with breastfeeding.  When you can have sex. Some women may want to talk about lubricants for the vagina.  Any feelings of sadness or restlessness that you are having.   When should you call for help?  Call 911 anytime you think you may need emergency care. For example, call if:  You have thoughts of harming yourself, your baby, or another person.  You passed out (lost consciousness).  Call the office at [phone] or seek immediate medical care if:  If you have heavy bleeding such that you are soaking 1 pad in an hour for 2 hours  You are dizzy or lightheaded, or you feel like you may faint.  You have a fever; a temperature of 101.0 F or greater  Chills  Difficulty urinating  Headache unrelieved by "pain meds"   Visual changes  Pain in the right side of your belly near your ribs  Breasts reddened, hard, hot to the touch or any other breast concerns  Nipple discharge which is foul-smelling or contains pus   New pain unrelieved with recommended over-the-counter dosages  Difficulty breathing with or without chest pain   New leg pain, swelling, or redness, especially if it is only on one leg  Any other concerns  Watch closely for changes in your health, and be sure to contact your provider if:  You have new or worse vaginal discharge.  You feel sad or depressed.  You are having problems with your breasts or breastfeeding.   Postpartum Baby Blues The postpartum period begins right after the birth of a baby. During this time, there is often a lot of joy and excitement. It is also a time of many changes in the life of the parents. No matter how many times a mother gives birth,  each child brings new challenges to the family, including different ways of relating to one another. It is common to have feelings of excitement along with confusing changes in moods, emotions, and thoughts. You may feel happy one minute and sad or stressed the next. These feelings of sadness usually happen in the period right after you have your baby, and they go away within a week or two. This is called the "baby blues." What are the causes? There is no known cause of baby blues. It is likely caused by a combination of factors. However, changes in hormone levels after childbirth are believed to trigger some of the symptoms. Other factors that can play a role in these mood changes include:  Lack of sleep.  Stressful life events, such as poverty, caring for a loved one, or death of a loved one.  Genetics. What are the signs or symptoms? Symptoms of this condition include:  Brief changes in mood, such as going from extreme happiness to sadness.  Decreased concentration.  Difficulty sleeping.  Crying spells and tearfulness.  Loss of appetite.  Irritability.  Anxiety. If the symptoms of baby blues last for more than 2 weeks or become more severe, you may have postpartum depression. How is this diagnosed? This condition is diagnosed based on an evaluation of your symptoms. There are no medical or lab tests that lead to a diagnosis, but there are various questionnaires that a health care provider may use to identify women with the baby blues or postpartum depression. How is this treated? Treatment is not needed for this condition. The baby blues usually go away on their own in 1-2 weeks. Social support is often all that is needed. You will be encouraged to get adequate sleep and rest. Follow these instructions at home: Lifestyle      Get as much rest as you can. Take a nap when the baby sleeps.  Exercise regularly as told by your health care provider. Some women find yoga and  walking to be helpful.  Eat a balanced and nourishing diet. This includes plenty of fruits and vegetables, whole grains, and lean proteins.  Do little things that you enjoy. Have a cup of tea, take a bubble bath, read your favorite magazine, or listen to your favorite music.  Avoid alcohol.  Ask for help with household chores, cooking, grocery shopping, or running errands. Do not try to do everything yourself. Consider hiring a postpartum doula to help. This is a professional who specializes in providing support to new mothers.  Try not to make any major life changes during pregnancy or right after giving birth. This can add stress. General instructions  Talk to people close to you about how you are feeling. Get support from your partner, family members, friends, or other new moms. You may want to join a support group.  Find ways to cope with stress. This may include: ? Writing your thoughts and feelings in a journal. ? Spending time outside. ? Spending time with people who make you laugh.  Try to stay positive in how you think. Think about the things you are grateful for.  Take over-the-counter and prescription medicines only as told by your health care provider.  Let your health care provider know if you have any concerns.  Keep all postpartum visits as told by your health care provider. This is important. Contact a health care provider if:  Your baby blues do not go away after 2 weeks. Get help right away if:  You have thoughts of taking your own life (suicidal thoughts).  You think you may harm the baby or other people.  You see or hear things that are not there (hallucinations). Summary  After giving birth, you may feel happy one minute and sad or stressed the next. Feelings of sadness that happen right after the baby is born and go away after a week or two are called the "baby blues."  You can manage the baby blues by getting enough rest, eating a healthy diet,  exercising, spending time with supportive people, and finding ways to cope with stress.  If feelings of sadness and stress last longer than 2 weeks or get in the way of caring for your baby, talk to your health  care provider. This may mean you have postpartum depression. This information is not intended to replace advice given to you by your health care provider. Make sure you discuss any questions you have with your health care provider. Document Revised: 08/03/2018 Document Reviewed: 06/07/2016 Elsevier Patient Education  2020 [employer]  Postpartum Care After Vaginal Delivery This sheet gives you information about how to care for yourself from the time you deliver your baby to up to 6-12 weeks after delivery (postpartum period). Your health care provider may also give you more specific instructions. If you have problems or questions, contact your health care provider. Follow these instructions at home: Vaginal bleeding  It is normal to have vaginal bleeding (lochia) after delivery. Wear a sanitary pad for vaginal bleeding and discharge. ? During the first week after delivery, the amount and appearance of lochia is often similar to a menstrual period. ? Over the next few weeks, it will gradually decrease to a dry, yellow-brown discharge. ? For most women, lochia stops completely by 4-6 weeks after delivery. Vaginal bleeding can vary from woman to woman.  Change your sanitary pads frequently. Watch for any changes in your flow, such as: ? A sudden increase in volume. ? A change in color. ? Large blood clots.  If you pass a blood clot from your vagina, save it and call your health care provider to discuss. Do not flush blood clots down the toilet before talking with your health care provider.  Do not use tampons or douches until your health care provider says this is safe.  If you are not breastfeeding, your period should return 6-8 weeks after delivery. If you are feeding your child  breast milk only (exclusive breastfeeding), your period may not return until you stop breastfeeding. Perineal care  Keep the area between the vagina and the anus (perineum) clean and dry as told by your health care provider. Use medicated pads and pain-relieving sprays and creams as directed.  If you had a cut in the perineum (episiotomy) or a tear in the vagina, check the area for signs of infection until you are healed. Check for: ? More redness, swelling, or pain. ? Fluid or blood coming from the cut or tear. ? Warmth. ? Pus or a bad smell.  You may be given a squirt bottle to use instead of wiping to clean the perineum area after you go to the bathroom. As you start healing, you may use the squirt bottle before wiping yourself. Make sure to wipe gently.  To relieve pain caused by an episiotomy, a tear in the vagina, or swollen veins in the anus (hemorrhoids), try taking a warm sitz bath 2-3 times a day. A sitz bath is a warm water bath that is taken while you are sitting down. The water should only come up to your hips and should cover your buttocks. Breast care  Within the first few days after delivery, your breasts may feel heavy, full, and uncomfortable (breast engorgement). Milk may also leak from your breasts. Your health care provider can suggest ways to help relieve the discomfort. Breast engorgement should go away within a few days.  If you are breastfeeding: ? Wear a bra that supports your breasts and fits you well. ? Keep your nipples clean and dry. Apply creams and ointments as told by your health care provider. ? You may need to use breast pads to absorb milk that leaks from your breasts. ? You may have uterine contractions every time you  breastfeed for up to several weeks after delivery. Uterine contractions help your uterus return to its normal size. ? If you have any problems with breastfeeding, work with your health care provider or Advertising copywriterlactation consultant.  If you are not  breastfeeding: ? Avoid touching your breasts a lot. Doing this can make your breasts produce more milk. ? Wear a good-fitting bra and use cold packs to help with swelling. ? Do not squeeze out (express) milk. This causes you to make more milk. Intimacy and sexuality  Ask your health care provider when you can engage in sexual activity. This may depend on: ? Your risk of infection. ? How fast you are healing. ? Your comfort and desire to engage in sexual activity.  You are able to get pregnant after delivery, even if you have not had your period. If desired, talk with your health care provider about methods of birth control (contraception). Medicines  Take over-the-counter and prescription medicines only as told by your health care provider.  If you were prescribed an antibiotic medicine, take it as told by your health care provider. Do not stop taking the antibiotic even if you start to feel better. Activity  Gradually return to your normal activities as told by your health care provider. Ask your health care provider what activities are safe for you.  Rest as much as possible. Try to rest or take a nap while your baby is sleeping. Eating and drinking   Drink enough fluid to keep your urine pale yellow.  Eat high-fiber foods every day. These may help prevent or relieve constipation. High-fiber foods include: ? Whole grain cereals and breads. ? Brown rice. ? Beans. ? Fresh fruits and vegetables.  Do not try to lose weight quickly by cutting back on calories.  Take your prenatal vitamins until your postpartum checkup or until your health care provider tells you it is okay to stop. Lifestyle  Do not use any products that contain nicotine or tobacco, such as cigarettes and e-cigarettes. If you need help quitting, ask your health care provider.  Do not drink alcohol, especially if you are breastfeeding. General instructions  Keep all follow-up visits for you and your baby as  told by your health care provider. Most women visit their health care provider for a postpartum checkup within the first 3-6 weeks after delivery. Contact a health care provider if:  You feel unable to cope with the changes that your child brings to your life, and these feelings do not go away.  You feel unusually sad or worried.  Your breasts become red, painful, or hard.  You have a fever.  You have trouble holding urine or keeping urine from leaking.  You have little or no interest in activities you used to enjoy.  You have not breastfed at all and you have not had a menstrual period for 12 weeks after delivery.  You have stopped breastfeeding and you have not had a menstrual period for 12 weeks after you stopped breastfeeding.  You have questions about caring for yourself or your baby.  You pass a blood clot from your vagina. Get help right away if:  You have chest pain.  You have difficulty breathing.  You have sudden, severe leg pain.  You have severe pain or cramping in your lower abdomen.  You bleed from your vagina so much that you fill more than one sanitary pad in one hour. Bleeding should not be heavier than your heaviest period.  You develop a  severe headache.  You faint.  You have blurred vision or spots in your vision.  You have bad-smelling vaginal discharge.  You have thoughts about hurting yourself or your baby. If you ever feel like you may hurt yourself or others, or have thoughts about taking your own life, get help right away. You can go to the nearest emergency department or call:  Your local emergency services (911 in the [place]  A suicide crisis helpline, such as the National Suicide Prevention Lifeline at 579-586-4482. This is open 24 hours a day. Summary  The period of time right after you deliver your newborn up to 6-12 weeks after delivery is called the postpartum period.  Gradually return to your normal activities as told by your  health care provider.  Keep all follow-up visits for you and your baby as told by your health care provider. This information is not intended to replace advice given to you by your health care provider. Make sure you discuss any questions you have with your health care provider. Document Revised: 04/14/2017 Document Reviewed: 01/23/2017 Elsevier Patient Education  2020 [employer].

## 2019-11-24 DIAGNOSIS — Z419 Encounter for procedure for purposes other than remedying health state, unspecified: Secondary | ICD-10-CM | POA: Diagnosis not present

## 2020-01-24 DIAGNOSIS — Z419 Encounter for procedure for purposes other than remedying health state, unspecified: Secondary | ICD-10-CM | POA: Diagnosis not present

## 2020-02-03 ENCOUNTER — Encounter: Payer: Self-pay | Admitting: Nurse Practitioner

## 2020-02-03 ENCOUNTER — Ambulatory Visit (INDEPENDENT_AMBULATORY_CARE_PROVIDER_SITE_OTHER): Payer: Managed Care, Other (non HMO) | Admitting: Nurse Practitioner

## 2020-02-03 VITALS — BP 91/62 | HR 74 | Ht 64.0 in | Wt 216.0 lb

## 2020-02-03 DIAGNOSIS — K5900 Constipation, unspecified: Secondary | ICD-10-CM | POA: Diagnosis not present

## 2020-02-03 DIAGNOSIS — R197 Diarrhea, unspecified: Secondary | ICD-10-CM | POA: Diagnosis not present

## 2020-02-03 DIAGNOSIS — E559 Vitamin D deficiency, unspecified: Secondary | ICD-10-CM

## 2020-02-03 DIAGNOSIS — D509 Iron deficiency anemia, unspecified: Secondary | ICD-10-CM | POA: Diagnosis not present

## 2020-02-03 DIAGNOSIS — Z903 Acquired absence of stomach [part of]: Secondary | ICD-10-CM

## 2020-02-03 DIAGNOSIS — K912 Postsurgical malabsorption, not elsewhere classified: Secondary | ICD-10-CM

## 2020-02-03 MED ORDER — COLESTIPOL HCL 1 G PO TABS: 2.0000 g | ORAL_TABLET | Freq: Two times a day (BID) | ORAL | 2 refills | Status: DC

## 2020-02-03 NOTE — Patient Instructions (Addendum)
Irritable Bowel Syndrome, Adult  Irritable bowel syndrome (IBS) is a group of symptoms that affects the organs responsible for digestion (gastrointestinal or GI tract). IBS is not one specific disease. To regulate how the GI tract works, the body sends signals back and forth between the intestines and the brain. If you have IBS, there may be a problem with these signals. As a result, the GI tract does not function normally. The intestines may become more sensitive and overreact to certain things. This may be especially true when you eat certain foods or when you are under stress. There are four types of IBS. These may be determined based on the consistency of your stool (feces):  IBS with diarrhea.  IBS with constipation.  Mixed IBS.  Unsubtyped IBS. It is important to know which type of IBS you have. Certain treatments are more likely to be helpful for certain types of IBS. What are the causes? The exact cause of IBS is not known. What increases the risk? You may have a higher risk for IBS if you:  Are female.  Are younger than 40.  Have a family history of IBS.  Have a mental health condition, such as depression, anxiety, or post-traumatic stress disorder.  Have had a bacterial infection of your GI tract. What are the signs or symptoms? Symptoms of IBS vary from person to person. The main symptom is abdominal pain or discomfort. Other symptoms usually include one or more of the following:  Diarrhea, constipation, or both.  Abdominal swelling or bloating.  Feeling full after eating a small or regular-sized meal.  Frequent gas.  Mucus in the stool.  A feeling of having more stool left after a bowel movement. Symptoms tend to come and go. They may be triggered by stress, mental health conditions, or certain foods. How is this diagnosed? This condition may be diagnosed based on a physical exam, your medical history, and your symptoms. You may have tests, such as:  Blood  tests.  Stool test.  X-rays.  CT scan.  Colonoscopy. This is a procedure in which your GI tract is viewed with a long, thin, flexible tube. How is this treated? There is no cure for IBS, but treatment can help relieve symptoms. Treatment depends on the type of IBS you have, and may include:  Changes to your diet, such as: ? Avoiding foods that cause symptoms. ? Drinking more water. ? Following a low-FODMAP (fermentable oligosaccharides, disaccharides, monosaccharides, and polyols) diet for up to 6 weeks, or as told by your health care provider. FODMAPs are sugars that are hard for some people to digest. ? Eating more fiber. ? Eating medium-sized meals at the same times every day.  Medicines. These may include: ? Fiber supplements, if you have constipation. ? Medicine to control diarrhea (antidiarrheal medicines). ? Medicine to help control muscle tightening (spasms) in your GI tract (antispasmodic medicines). ? Medicines to help with mental health conditions, such as antidepressants or tranquilizers.  Talk therapy or counseling.  Working with a diet and nutrition specialist (dietitian) to help create a food plan that is right for you.  Managing your stress. Follow these instructions at home: Eating and drinking  Eat a healthy diet.  Eat medium-sized meals at about the same time every day. Do not eat large meals.  Gradually eat more fiber-rich foods. These include whole grains, fruits, and vegetables. This may be especially helpful if you have IBS with constipation.  Eat a diet low in FODMAPs.  Drink enough   fluid to keep your urine pale yellow.  Keep a journal of foods that seem to trigger symptoms.  Avoid foods and drinks that: ? Contain added sugar. ? Make your symptoms worse. Dairy products, caffeinated drinks, and carbonated drinks can make symptoms worse for some people. General instructions  Take over-the-counter and prescription medicines and supplements only  as told by your health care provider.  Get enough exercise. Do at least 150 minutes of moderate-intensity exercise each week.  Manage your stress. Getting enough sleep and exercise can help you manage stress.  Keep all follow-up visits as told by your health care provider and therapist. This is important. Alcohol Use  Do not drink alcohol if: ? Your health care provider tells you not to drink. ? You are pregnant, may be pregnant, or are planning to become pregnant.  If you drink alcohol, limit how much you have: ? 0-1 drink a day for women. ? 0-2 drinks a day for men.  Be aware of how much alcohol is in your drink. In the [place] one drink equals one typical bottle of beer (12 oz), one-half glass of wine (5 oz), or one shot of hard liquor (1 oz). Contact a health care provider if you have:  Constant pain.  Weight loss.  Difficulty or pain when swallowing.  Diarrhea that gets worse. Get help right away if you have:  Severe abdominal pain.  Fever.  Diarrhea with symptoms of dehydration, such as dizziness or dry mouth.  Bright red blood in your stool.  Stool that is black and tarry.  Abdominal swelling.  Vomiting that does not stop.  Blood in your vomit. Summary  Irritable bowel syndrome (IBS) is not one specific disease. It is a group of symptoms that affects digestion.  Your intestines may become more sensitive and overreact to certain things. This may be especially true when you eat certain foods or when you are under stress.  There is no cure for IBS, but treatment can help relieve symptoms. This information is not intended to replace advice given to you by your health care provider. Make sure you discuss any questions you have with your health care provider. Document Revised: 04/04/2017 Document Reviewed: 04/04/2017 Elsevier Patient Education  2020 [employer]   Diarrhea, Adult Diarrhea is frequent loose and watery bowel movements. Diarrhea can make you  feel weak and cause you to become dehydrated. Dehydration can make you tired and thirsty, cause you to have a dry mouth, and decrease how often you urinate. Diarrhea typically lasts 2-3 days. However, it can last longer if it is a sign of something more serious. It is important to treat your diarrhea as told by your health care provider. Follow these instructions at home: Eating and drinking     Follow these recommendations as told by your health care provider:  Take an oral rehydration solution (ORS). This is an over-the-counter medicine that helps return your body to its normal balance of nutrients and water. It is found at pharmacies and retail stores.  Drink plenty of fluids, such as water, ice chips, diluted fruit juice, and low-calorie sports drinks. You can drink milk also, if desired.  Avoid drinking fluids that contain a lot of sugar or caffeine, such as energy drinks, sports drinks, and soda.  Eat bland, easy-to-digest foods in small amounts as you are able. These foods include bananas, applesauce, rice, lean meats, toast, and crackers.  Avoid alcohol.  Avoid spicy or fatty foods.  Medicines  Take over-the-counter and  prescription medicines only as told by your health care provider.  If you were prescribed an antibiotic medicine, take it as told by your health care provider. Do not stop using the antibiotic even if you start to feel better. General instructions   Wash your hands often using soap and water. If soap and water are not available, use a hand sanitizer. Others in the household should wash their hands as well. Hands should be washed: ? After using the toilet or changing a diaper. ? Before preparing, cooking, or serving food. ? While caring for a sick person or while visiting someone in a hospital.  Drink enough fluid to keep your urine pale yellow.  Rest at home while you recover.  Watch your condition for any changes.  Take a warm bath to relieve any  burning or pain from frequent diarrhea episodes.  Keep all follow-up visits as told by your health care provider. This is important. Contact a health care provider if:  You have a fever.  Your diarrhea gets worse.  You have new symptoms.  You cannot keep fluids down.  You feel light-headed or dizzy.  You have a headache.  You have muscle cramps. Get help right away if:  You have chest pain.  You feel extremely weak or you faint.  You have bloody or black stools or stools that look like tar.  You have severe pain, cramping, or bloating in your abdomen.  You have trouble breathing or you are breathing very quickly.  Your heart is beating very quickly.  Your skin feels cold and clammy.  You feel confused.  You have signs of dehydration, such as: ? Dark urine, very little urine, or no urine. ? Cracked lips. ? Dry mouth. ? Sunken eyes. ? Sleepiness. ? Weakness. Summary  Diarrhea is frequent loose and watery bowel movements. Diarrhea can make you feel weak and cause you to become dehydrated.  Drink enough fluids to keep your urine pale yellow.  Make sure that you wash your hands after using the toilet. If soap and water are not available, use hand sanitizer.  Contact a health care provider if your diarrhea gets worse or you have new symptoms.  Get help right away if you have signs of dehydration. This information is not intended to replace advice given to you by your health care provider. Make sure you discuss any questions you have with your health care provider. Document Revised: 08/28/2018 Document Reviewed: 09/15/2017 Elsevier Patient Education  2020 [employer]   Loperamide tablets or capsules What is this medicine? LOPERAMIDE (loe PER a mide) is used to treat diarrhea. This medicine may be used for other purposes; ask your health care provider or pharmacist if you have questions. COMMON BRAND NAME(S): Anti-Diarrheal, Imodium A-D, K-Pek II What should  I tell my health care provider before I take this medicine? They need to know if you have any of these conditions:  a black or bloody stool  bacterial food poisoning  colitis or mucus in your stool  currently taking an antibiotic medication for an infection  fever  history of irregular heartbeat  liver disease  severe abdominal pain, swelling or bulging  an unusual or allergic reaction to loperamide, other medicines, foods, dyes, or preservatives  pregnant or trying to get pregnant  breast-feeding How should I use this medicine? Take this medicine by mouth with a glass of water. Follow the directions on the prescription label. Take your doses at regular intervals. Do not take your  medicine more often than directed. Talk to your pediatrician regarding the use of this medicine in children. Special care may be needed. Overdosage: If you think you have taken too much of this medicine contact a poison control center or emergency room at once. NOTE: This medicine is only for you. Do not share this medicine with others. What if I miss a dose? This does not apply. This medicine is not for regular use. Only take this medicine while you continue to have loose bowel movements. Do not take more medicine than recommended by the packaging label or by your healthcare professional. What may interact with this medicine? Do not take this medicine with any of the following medications:   alosetron This medicine may also interact with the following medications:  cimetidine  clarithromycin  erythromycin  gemfibrozil  itraconazole  ketoconazole  quinidine  quinine  ranitidine  ritonavir  saquinavir This list may not describe all possible interactions. Give your health care provider a list of all the medicines, herbs, non-prescription drugs, or dietary supplements you use. Also tell them if you smoke, drink alcohol, or use illegal drugs. Some items may interact with your  medicine. What should I watch for while using this medicine? Do not take this medicine for more than 2 days without asking your doctor or health care professional. Do not use doses higher than those prescribed by your doctor or listed on the label. Check with your doctor or health care professional right away if you develop a fever, severe abdominal pain, swelling or bulging, or if you have have bloody/black diarrhea or stools. You may get drowsy or dizzy. Do not drive, use machinery, or do anything that needs mental alertness until you know how this medicine affects you. Do not stand or sit up quickly, especially if you are an older patient. This reduces the risk of dizzy or fainting spells. Alcohol can increase possible drowsiness and dizziness. Avoid alcoholic drinks. Your mouth may get dry. Chewing sugarless gum or sucking hard candy, and drinking plenty of water may help. Contact your doctor if the problem does not go away or is severe. Drinking plenty of water can also help prevent dehydration that can occur with diarrhea. Elderly patients may have a more variable response to the effects of this medicine, and are more susceptible to the effects of dehydration. What side effects may I notice from receiving this medicine? Side effects that you should report to your doctor or health care professional as soon as possible:   allergic reactions like skin rash, itching or hives, swelling of the face, lips, or tongue  bloated, swollen feeling in your abdomen  blurred vision  loss of appetite  signs and symptoms of a dangerous change in heartbeat or heart rhythm like chest pain; dizziness; fast or irregular heartbeat palpitations; feeling faint or lightheaded, falls; breathing problems  stomach pain Side effects that usually do not require medical attention (report to your doctor or health care professional if they continue or are bothersome):   constipation  drowsiness or dizziness  dry  mouth  nausea, vomiting This list may not describe all possible side effects. Call your doctor for medical advice about side effects. You may report side effects to FDA at 1-800-FDA-1088. Where should I keep my medicine? Keep out of the reach of children. Store at room temperature between 15 and 25 degrees C (59 and 77 degrees F). Keep container tightly closed. Throw away any unused medicine after the expiration date. NOTE: This sheet  is a summary. It may not cover all possible information. If you have questions about this medicine, talk to your doctor, pharmacist, or health care provider.  2020 Elsevier/[employer] Standard (2017-07-03 11:30:48)

## 2020-02-03 NOTE — Assessment & Plan Note (Signed)
History of vitamin D deficiency. We will check labs for celiac disease in the setting of vitamin D deficiency and chronic diarrhea symptoms. We will make changes to the plan of care based on laboratory results.

## 2020-02-03 NOTE — Progress Notes (Signed)
Acute Office Visit  Subjective:    Patient ID: Alexa Hunter, female    DOB: 1986/01/12, 34 y.o.   MRN: [id number]  Chief Complaint  Patient presents with   Diarrhea    HPI Alexa Hunter is a 34 year old female presenting today with concerns of chronic, intermittent diarrhea and constipation.    She reports that she will have 2-3 days of diarrhea in a row followed by several days of constipation. She reports the cycle will repeat and has been ongoing for months-years, but seems to be getting worse. She tells me that it is significantly impacting her job as she has to run to the bathroom with little warning on the days that she has diarrhea. She does not take any medication reguarly for it, but when she has taken imodium it seems to help. The diarrhea is not accompanied by fevers, blood, cramping, or sweating. The diarrhea is not caused by any particular foods that she notices. She feels her symptoms have really worsened since her last gastric bypass revision. She reports she does not feel dehydrated and her urine is of normal color.   DIARRHEA Onset: months-years Number of bowel movements a day: between 5-10 on days it is active   Color : yellow Consistency: water loss (diarrhea) Present status: is moderately worse   Weight loss:  Difficult to determine- she just had a baby  If yes, how many pounds: N/A Artificial sugars/sodas/chewing gum:  Yes - splenda with coffee Bloating:  Yes  Gas:  Yes  Antibiotic use in past 30 days: No If yes, what antibiotic: N/A  Have you had any symptoms like this in the past: Yes  Abdominal pain/cramping: No  Associated with certain foods: No   If yes, what foods: N/A Associated with stress: No  Any household members with similar symptoms: No  History of GI disorder:  No   If yes, what was the diagnosis: N/A History of GI surgery:  Yes   If yes, what procedure and when: Gastric Sleeve June 2017, Gastric Sleeve Revision August 2019,  Cholecystectomy August 2013 Prior colonoscopy:  Yes- years ago   Past Medical History:  Diagnosis Date   Abnormal Pap smear of cervix    Asthma    Contact dermatitis    Encounter for suspected PROM, with rupture of membranes not found 10/12/2019   History of depression 05/13/2015   History of preterm delivery, currently pregnant in third trimester 02/20/2017   Labor and delivery, indication for care 02/20/2017   Preterm uterine contractions in third trimester, antepartum 10/01/2019    Past Surgical History:  Procedure Laterality Date   CHOLECYSTECTOMY  2013   GASTRIC BYPASS  09/28/2015   none      Family History  Problem Relation Age of Onset   Migraines Mother    Hypertension Mother    Diabetes Maternal Grandmother    Diabetes Sister     Social History   Socioeconomic History   Marital status: Married    Spouse name: Lovena Le   Number of children: Not on file   Years of education: Not on file   Highest education level: Not on file  Occupational History   Occupation: Leisure centre manager: [employer]    Comment: [employer]   Tobacco Use   Smoking status: Never Smoker   Smokeless tobacco: Never Used  Scientific laboratory technician Use: Never used  Substance and Sexual Activity   Alcohol use: No  Drug use: No   Sexual activity: Yes    Birth control/protection: Surgical    Comment: BTL  Other Topics Concern   Not on file  Social History Narrative   Has a son.     Social Determinants of Health   Financial Resource Strain:    Difficulty of Paying Living Expenses: Not on file  Food Insecurity:    Worried About Charity fundraiser in the Last Year: Not on file   [employer] of Food in the Last Year: Not on file  Transportation Needs:    Lack of Transportation (Medical): Not on file   Lack of Transportation (Non-Medical): Not on file  Physical Activity:    Days of Exercise per Week: Not on file   Minutes of Exercise per Session:  Not on file  Stress:    Feeling of Stress : Not on file  Social Connections:    Frequency of Communication with Friends and Family: Not on file   Frequency of Social Gatherings with Friends and Family: Not on file   Attends Religious Services: Not on file   Active Member of Clubs or Organizations: Not on file   Attends Archivist Meetings: Not on file   Marital Status: Not on file  Intimate Partner Violence:    Fear of Current or Ex-Partner: Not on file   Emotionally Abused: Not on file   Physically Abused: Not on file   Sexually Abused: Not on file    Outpatient Medications Prior to Visit  Medication Sig Dispense Refill   acetaminophen (TYLENOL) 325 MG tablet Take 2 tablets (650 mg total) by mouth every 6 (six) hours as needed (for pain scale < 4).     ferrous sulfate 325 (65 FE) MG tablet Take 1 tablet (325 mg total) by mouth daily with breakfast.  3   ibuprofen (ADVIL) 600 MG tablet Take 1 tablet (600 mg total) by mouth every 6 (six) hours as needed.     Multiple Vitamin (MULTIVITAMIN) capsule Take by mouth.     No facility-administered medications prior to visit.    Allergies  Allergen Reactions   Flagyl [Metronidazole] Nausea And Vomiting    Oral flagyl only    Review of Systems See HPI    Objective:    Physical Exam Vitals and nursing note reviewed.  Constitutional:      Appearance: Normal appearance.  HENT:     Head: Normocephalic.  Eyes:     Extraocular Movements: Extraocular movements intact.     Conjunctiva/sclera: Conjunctivae normal.     Pupils: Pupils are equal, round, and reactive to light.  Cardiovascular:     Rate and Rhythm: Normal rate and regular rhythm.     Pulses: Normal pulses.     Heart sounds: Normal heart sounds.  Pulmonary:     Effort: Pulmonary effort is normal.  Abdominal:     General: There is no distension.     Palpations: Abdomen is soft.     Tenderness: There is no abdominal tenderness. There is no  guarding.  Musculoskeletal:        General: Normal range of motion.     Right lower leg: No edema.     Left lower leg: No edema.  Skin:    General: Skin is warm and dry.  Neurological:     General: No focal deficit present.     Mental Status: She is alert and oriented to person, place, and time.  Psychiatric:  Mood and Affect: Mood normal.        Behavior: Behavior normal.        Thought Content: Thought content normal.        Judgment: Judgment normal.     BP 91/62    Pulse 74    Ht $R'5\' 4"'WD$  (1.626 m)    Wt 216 lb (98 kg)    SpO2 100%    BMI 37.08 kg/m  Wt Readings from Last 3 Encounters:  02/03/20 216 lb (98 kg)  10/22/19 227 lb (103 kg)  10/12/19 230 lb (104.3 kg)    Health Maintenance Due  Topic Date Due   Hepatitis C Screening  Never done   PAP SMEAR-Modifier  04/28/2017    There are no preventive care reminders to display for this patient.   Lab Results  Component Value Date   TSH 0.784 11/30/2009   Lab Results  Component Value Date   WBC 14.4 (H) 10/24/2019   HGB 9.1 (L) 10/24/2019   HCT 29.2 (L) 10/24/2019   MCV 75.6 (L) 10/24/2019   PLT 255 10/24/2019   Lab Results  Component Value Date   NA 137 10/23/2019   K 3.7 10/23/2019   CO2 19 (L) 10/23/2019   GLUCOSE 116 (H) 10/23/2019   BUN 6 10/23/2019   CREATININE 0.60 10/23/2019   BILITOT 0.7 10/23/2019   ALKPHOS 119 10/23/2019   AST 25 10/23/2019   ALT 11 10/23/2019   PROT 5.7 (L) 10/23/2019   ALBUMIN 2.2 (L) 10/23/2019   CALCIUM 8.4 (L) 10/23/2019   ANIONGAP 9 10/23/2019   Lab Results  Component Value Date   CHOL 234 (H) 11/30/2009   Lab Results  Component Value Date   HDL 53 11/30/2009   Lab Results  Component Value Date   LDLCALC 160 (H) 11/30/2009   Lab Results  Component Value Date   TRIG 104 11/30/2009   Lab Results  Component Value Date   CHOLHDL 4.4 Ratio 11/30/2009   No results found for: HGBA1C     Assessment & Plan:   Problem List Items Addressed This Visit       Digestive   Postgastrectomy malabsorption    Malabsorption noted on lab work in the past after gastric bypass surgery. Is unclear at this time if her diarrhea symptoms could be a manifestation of malabsorption or status post cholecystectomy more possible other etiology. We will recheck labs today to determine if she is still exhibiting signs of chronic malabsorption. We will follow up and adjust plan of care based on lab results.      Relevant Orders   COMPLETE METABOLIC PANEL WITH GFR     Other   Vitamin D deficiency    History of vitamin D deficiency. We will check labs for celiac disease in the setting of vitamin D deficiency and chronic diarrhea symptoms. We will make changes to the plan of care based on laboratory results.      Relevant Orders   Celiac Disease Panel   COMPLETE METABOLIC PANEL WITH GFR   TSH + free T4   Iron deficiency anemia    History of iron deficiency anemia in the setting of malabsorption after bypass surgery. We will recheck these labs today to see if they have improved since they were last drawn July after she delivered her baby.      Relevant Orders   CBC with Differential   Constipation    History of intermittent constipation and chronic diarrhea. At this time the  etiology of her symptoms are unclear. Considering irritable bowel syndrome vs. cholecystectomy induced changes vs.celiac disease vs. Thyroid dysfunction. We we will monitor labs today to evaluate for celiac disease and thyroid disease.  We will make changes to plan of care based on lab results.      Relevant Orders   CBC with Differential   Celiac Disease Panel   COMPLETE METABOLIC PANEL WITH GFR   C-reactive protein   Sed Rate (ESR)   TSH + free T4   Diarrhea - Primary    Intermittent episodes of chronic diarrhea followed by constipation of unknown etiology. Will obtain labs today to evaluate for thyroid dysfunction, celiac disease, inflammatory markers, and electrolyte  imbalance. Discussed with the patient the option to try bile acid sequestrant given the history of cholecystectomy.  She would like to try this.  Prescription provided today for more 2 g of Colestid twice a day with the option to increase dose based on response. Also discussed with the patient the option to utilize Imodium for symptom control as long as no fevers are present and there is no blood in the stool. If unable to find etiology based on lab results feel the next next step would be to discuss with GI surgeon and seek evaluation with colonoscopy. We will make changes to the plan of care and follow-up based on lab results.      Relevant Medications   colestipol (COLESTID) 1 g tablet   Other Relevant Orders   CBC with Differential   Celiac Disease Panel   COMPLETE METABOLIC PANEL WITH GFR   C-reactive protein   Sed Rate (ESR)   TSH + free T4       Meds ordered this encounter  Medications   colestipol (COLESTID) 1 g tablet    Sig: Take 2 tablets (2 g total) by mouth 2 (two) times daily.    Dispense:  120 tablet    Refill:  2   Will determine follow-up based on lab results.  Orma Render, NP

## 2020-02-03 NOTE — Assessment & Plan Note (Signed)
Intermittent episodes of chronic diarrhea followed by constipation of unknown etiology. Will obtain labs today to evaluate for thyroid dysfunction, celiac disease, inflammatory markers, and electrolyte imbalance. Discussed with the patient the option to try bile acid sequestrant given the history of cholecystectomy.  She would like to try this.  Prescription provided today for more 2 g of Colestid twice a day with the option to increase dose based on response. Also discussed with the patient the option to utilize Imodium for symptom control as long as no fevers are present and there is no blood in the stool. If unable to find etiology based on lab results feel the next next step would be to discuss with GI surgeon and seek evaluation with colonoscopy. We will make changes to the plan of care and follow-up based on lab results.

## 2020-02-03 NOTE — Assessment & Plan Note (Signed)
History of intermittent constipation and chronic diarrhea. At this time the etiology of her symptoms are unclear. Considering irritable bowel syndrome vs. cholecystectomy induced changes vs.celiac disease vs. Thyroid dysfunction. We we will monitor labs today to evaluate for celiac disease and thyroid disease.  We will make changes to plan of care based on lab results.

## 2020-02-03 NOTE — Assessment & Plan Note (Signed)
History of iron deficiency anemia in the setting of malabsorption after bypass surgery. We will recheck these labs today to see if they have improved since they were last drawn July after she delivered her baby.

## 2020-02-03 NOTE — Assessment & Plan Note (Signed)
Malabsorption noted on lab work in the past after gastric bypass surgery. Is unclear at this time if her diarrhea symptoms could be a manifestation of malabsorption or status post cholecystectomy more possible other etiology. We will recheck labs today to determine if she is still exhibiting signs of chronic malabsorption. We will follow up and adjust plan of care based on lab results.

## 2020-02-06 LAB — CBC WITH DIFFERENTIAL/PLATELET
Basophils Absolute: 0 10*3/uL (ref 0.0–0.2)
Basos: 1 %
EOS (ABSOLUTE): 0 10*3/uL (ref 0.0–0.4)
Eos: 1 %
Hematocrit: 34.5 % (ref 34.0–46.6)
Hemoglobin: 10.5 g/dL — ABNORMAL LOW (ref 11.1–15.9)
Immature Grans (Abs): 0 10*3/uL (ref 0.0–0.1)
Immature Granulocytes: 0 %
Lymphocytes Absolute: 2.9 10*3/uL (ref 0.7–3.1)
Lymphs: 39 %
MCH: 23.3 pg — ABNORMAL LOW (ref 26.6–33.0)
MCHC: 30.4 g/dL — ABNORMAL LOW (ref 31.5–35.7)
MCV: 77 fL — ABNORMAL LOW (ref 79–97)
Monocytes Absolute: 0.7 10*3/uL (ref 0.1–0.9)
Monocytes: 9 %
Neutrophils Absolute: 3.8 10*3/uL (ref 1.4–7.0)
Neutrophils: 50 %
Platelets: 358 10*3/uL (ref 150–450)
RBC: 4.5 x10E6/uL (ref 3.77–5.28)
RDW: 16 % — ABNORMAL HIGH (ref 11.7–15.4)
WBC: 7.5 10*3/uL (ref 3.4–10.8)

## 2020-02-06 LAB — CELIAC DISEASE PANEL
Endomysial IgA: NEGATIVE
IgA/Immunoglobulin A, Serum: 348 mg/dL (ref 87–352)
Transglutaminase IgA: <2 U/mL (ref 0–3)

## 2020-02-06 LAB — COMPREHENSIVE METABOLIC PANEL
ALT: 16 IU/L (ref 0–32)
AST: 23 IU/L (ref 0–40)
Albumin/Globulin Ratio: 1.4 (ref 1.2–2.2)
Albumin: 4.3 g/dL (ref 3.8–4.8)
Alkaline Phosphatase: 157 IU/L — ABNORMAL HIGH (ref 44–121)
BUN/Creatinine Ratio: 9 (ref 9–23)
BUN: 6 mg/dL (ref 6–20)
Bilirubin Total: 0.5 mg/dL (ref 0.0–1.2)
CO2: 24 mmol/L (ref 20–29)
Calcium: 9.1 mg/dL (ref 8.7–10.2)
Chloride: 101 mmol/L (ref 96–106)
Creatinine, Ser: 0.68 mg/dL (ref 0.57–1.00)
GFR calc Af Amer: 131 mL/min/{1.73_m2} (ref >59)
GFR calc non Af Amer: 114 mL/min/{1.73_m2} (ref >59)
Globulin, Total: 3 g/dL (ref 1.5–4.5)
Glucose: 80 mg/dL (ref 65–99)
Potassium: 4.3 mmol/L (ref 3.5–5.2)
Sodium: 139 mmol/L (ref 134–144)
Total Protein: 7.3 g/dL (ref 6.0–8.5)

## 2020-02-06 LAB — TSH+FREE T4
Free T4: 1 ng/dL (ref 0.82–1.77)
TSH: 0.972 u[IU]/mL (ref 0.450–4.500)

## 2020-02-06 LAB — SEDIMENTATION RATE: Sed Rate: 77 mm/hr — ABNORMAL HIGH (ref 0–32)

## 2020-02-06 LAB — C-REACTIVE PROTEIN: CRP: 4 mg/L (ref 0–10)

## 2020-02-07 ENCOUNTER — Telehealth: Payer: Self-pay

## 2020-02-07 ENCOUNTER — Other Ambulatory Visit: Payer: Self-pay | Admitting: Nurse Practitioner

## 2020-02-07 DIAGNOSIS — K5900 Constipation, unspecified: Secondary | ICD-10-CM

## 2020-02-07 DIAGNOSIS — K912 Postsurgical malabsorption, not elsewhere classified: Secondary | ICD-10-CM

## 2020-02-07 DIAGNOSIS — Z903 Acquired absence of stomach [part of]: Secondary | ICD-10-CM

## 2020-02-07 DIAGNOSIS — R197 Diarrhea, unspecified: Secondary | ICD-10-CM

## 2020-02-07 NOTE — Progress Notes (Signed)
Labs have come back and everything looks relatively normal. Your labs are improving from three months ago and there is no sign of infection, thyroid disease, or celiac disease.   It does look like you had some inflammation in the the recent past, but this has resolved (sed rate shows long term inflammation, crp shows inflammation now).   I recommend a diet low in carbohydrates and saturated fats and high in fiber.  We can place a GI referral if you would like to get further workup- they will likely suggest a colonoscopy for evaluation.

## 2020-02-07 NOTE — Telephone Encounter (Signed)
Pt called and said that she is able to see some of her lab results through MyChart and that there are some abnormal results. She would like further information regarding the results and what she should do.

## 2020-02-10 NOTE — Telephone Encounter (Signed)
Patient aware of results and recommendations. °

## 2020-02-24 DIAGNOSIS — Z419 Encounter for procedure for purposes other than remedying health state, unspecified: Secondary | ICD-10-CM | POA: Diagnosis not present

## 2020-03-25 DIAGNOSIS — Z419 Encounter for procedure for purposes other than remedying health state, unspecified: Secondary | ICD-10-CM | POA: Diagnosis not present

## 2020-04-25 DIAGNOSIS — Z419 Encounter for procedure for purposes other than remedying health state, unspecified: Secondary | ICD-10-CM | POA: Diagnosis not present

## 2020-05-07 ENCOUNTER — Ambulatory Visit: Payer: Self-pay

## 2020-05-26 DIAGNOSIS — Z419 Encounter for procedure for purposes other than remedying health state, unspecified: Secondary | ICD-10-CM | POA: Diagnosis not present

## 2020-06-23 DIAGNOSIS — Z419 Encounter for procedure for purposes other than remedying health state, unspecified: Secondary | ICD-10-CM | POA: Diagnosis not present

## 2020-07-24 DIAGNOSIS — Z419 Encounter for procedure for purposes other than remedying health state, unspecified: Secondary | ICD-10-CM | POA: Diagnosis not present

## 2020-08-23 DIAGNOSIS — Z419 Encounter for procedure for purposes other than remedying health state, unspecified: Secondary | ICD-10-CM | POA: Diagnosis not present

## 2020-09-23 DIAGNOSIS — Z419 Encounter for procedure for purposes other than remedying health state, unspecified: Secondary | ICD-10-CM | POA: Diagnosis not present

## 2020-10-23 DIAGNOSIS — Z419 Encounter for procedure for purposes other than remedying health state, unspecified: Secondary | ICD-10-CM | POA: Diagnosis not present

## 2020-11-20 ENCOUNTER — Ambulatory Visit: Payer: Self-pay

## 2020-11-23 DIAGNOSIS — Z419 Encounter for procedure for purposes other than remedying health state, unspecified: Secondary | ICD-10-CM | POA: Diagnosis not present

## 2020-12-24 DIAGNOSIS — Z419 Encounter for procedure for purposes other than remedying health state, unspecified: Secondary | ICD-10-CM | POA: Diagnosis not present

## 2021-01-23 DIAGNOSIS — Z419 Encounter for procedure for purposes other than remedying health state, unspecified: Secondary | ICD-10-CM | POA: Diagnosis not present

## 2021-02-23 DIAGNOSIS — Z419 Encounter for procedure for purposes other than remedying health state, unspecified: Secondary | ICD-10-CM | POA: Diagnosis not present

## 2021-03-25 ENCOUNTER — Ambulatory Visit (INDEPENDENT_AMBULATORY_CARE_PROVIDER_SITE_OTHER): Payer: Managed Care, Other (non HMO) | Admitting: Family Medicine

## 2021-03-25 ENCOUNTER — Encounter: Payer: Self-pay | Admitting: Family Medicine

## 2021-03-25 ENCOUNTER — Other Ambulatory Visit: Payer: Self-pay

## 2021-03-25 VITALS — BP 108/70 | HR 65 | Ht 64.0 in | Wt 193.0 lb

## 2021-03-25 DIAGNOSIS — F5089 Other specified eating disorder: Secondary | ICD-10-CM | POA: Diagnosis not present

## 2021-03-25 DIAGNOSIS — K5900 Constipation, unspecified: Secondary | ICD-10-CM

## 2021-03-25 DIAGNOSIS — D508 Other iron deficiency anemias: Secondary | ICD-10-CM

## 2021-03-25 DIAGNOSIS — Z903 Acquired absence of stomach [part of]: Secondary | ICD-10-CM

## 2021-03-25 DIAGNOSIS — E559 Vitamin D deficiency, unspecified: Secondary | ICD-10-CM | POA: Diagnosis not present

## 2021-03-25 DIAGNOSIS — K912 Postsurgical malabsorption, not elsewhere classified: Secondary | ICD-10-CM

## 2021-03-25 DIAGNOSIS — D509 Iron deficiency anemia, unspecified: Secondary | ICD-10-CM | POA: Diagnosis not present

## 2021-03-25 DIAGNOSIS — Z419 Encounter for procedure for purposes other than remedying health state, unspecified: Secondary | ICD-10-CM | POA: Diagnosis not present

## 2021-03-25 NOTE — Progress Notes (Signed)
Acute Office Visit  Subjective:    Patient ID: Alexa Hunter, female    DOB: 02/15/1986, 35 y.o.   MRN: [id number]  Chief Complaint  Patient presents with   pica    HPI Patient is in today for reviewing the chart.  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  She has not been able to get back in with her surgeon and she says ever since she had her last child about 18 months ago she has been craving chalk but is gotten significantly worse in the last 4 months.  She says she notices occasionally maybe feeling a little winded or fatigued.  But nothing severe she still been working full-time  She has been under a lot of stress recently.she and her husband sold their house and they are currently living with her in-laws while they are looking for a new home.  She has 3 kids this has been pretty stressful.  They are hoping they can find something soon.  Because she has been eating chalk she is also been experiencing some constipation.  She has had a prior history of iron deficiency and has had to have iron infusions she says she really just does not like taking pills she often forgets.  She rather have an iron infusion if it is low again.  Did discuss starting at least an over-the-counter multivitamin again even if it is a chewable version that would be fantastic.  Avoid capsules because of her surgery.  Past Medical History:  Diagnosis Date   Abnormal Pap smear of cervix    Asthma    Contact dermatitis    Encounter for suspected PROM, with rupture of membranes not found 10/12/2019   History of depression 05/13/2015   History of preterm delivery, currently pregnant in third trimester 02/20/2017   Labor and delivery, indication for care 02/20/2017   Preterm uterine contractions in third trimester, antepartum 10/01/2019    Past Surgical History:  Procedure Laterality Date   CHOLECYSTECTOMY  2013   GASTRIC BYPASS  09/28/2015    none      Family History  Problem Relation Age of Onset   Migraines Mother    Hypertension Mother    Diabetes Maternal Grandmother    Diabetes Sister     Social History   Socioeconomic History   Marital status: Married    Spouse name: Ladona Ridgel   Number of children: Not on file   Years of education: Not on file   Highest education level: Not on file  Occupational History   Occupation: Research officer, political party: [employer]    Comment: [employer]   Tobacco Use   Smoking status: Never   Smokeless tobacco: Never  Vaping Use   Vaping Use: Never used  Substance and Sexual Activity   Alcohol use: No   Drug use: No   Sexual activity: Yes    Birth control/protection: Surgical    Comment: BTL  Other Topics Concern   Not on file  Social History Narrative   Has a son.     Social Determinants of Health   Financial Resource Strain: Not on file  Food Insecurity: Not on file  Transportation Needs: Not on file  Physical Activity: Not on file  Stress: Not on file  Social Connections: Not on file  Intimate Partner Violence: Not on file    Outpatient Medications Prior to Visit  Medication Sig Dispense  Refill   acetaminophen (TYLENOL) 325 MG tablet Take 2 tablets (650 mg total) by mouth every 6 (six) hours as needed (for pain scale < 4).     colestipol (COLESTID) 1 g tablet Take 2 tablets (2 g total) by mouth 2 (two) times daily. 120 tablet 2   ferrous sulfate 325 (65 FE) MG tablet Take 1 tablet (325 mg total) by mouth daily with breakfast.  3   ibuprofen (ADVIL) 600 MG tablet Take 1 tablet (600 mg total) by mouth every 6 (six) hours as needed.     Multiple Vitamin (MULTIVITAMIN) capsule Take by mouth.     No facility-administered medications prior to visit.    Allergies  Allergen Reactions   Flagyl [Metronidazole] Nausea And Vomiting    Oral flagyl only    Review of Systems     Objective:    Physical Exam Vitals and nursing note reviewed.  Constitutional:       Appearance: She is well-developed.  HENT:     Head: Normocephalic and atraumatic.  Eyes:     Comments: Pale conjunctiva  Cardiovascular:     Rate and Rhythm: Normal rate and regular rhythm.     Heart sounds: Normal heart sounds.  Pulmonary:     Effort: Pulmonary effort is normal.     Breath sounds: Normal breath sounds.  Skin:    General: Skin is warm and dry.  Neurological:     Mental Status: She is alert and oriented to person, place, and time.  Psychiatric:        Behavior: Behavior normal.    BP 108/70   Pulse 65   Ht 5\' 4"  (1.626 m)   Wt 193 lb (87.5 kg)   LMP 02/12/2021   SpO2 98%   BMI 33.13 kg/m  Wt Readings from Last 3 Encounters:  03/25/21 193 lb (87.5 kg)  02/03/20 216 lb (98 kg)  10/22/19 227 lb (103 kg)    Health Maintenance Due  Topic Date Due   Pneumococcal Vaccine 58-61 Years old (1 - PCV) Never done   Hepatitis C Screening  Never done   PAP SMEAR-Modifier  04/28/2017   COVID-19 Vaccine (3 - Pfizer risk series) 09/11/2019    There are no preventive care reminders to display for this patient.   Lab Results  Component Value Date   TSH 0.972 02/04/2020   Lab Results  Component Value Date   WBC 7.5 02/04/2020   HGB 10.5 (L) 02/04/2020   HCT 34.5 02/04/2020   MCV 77 (L) 02/04/2020   PLT 358 02/04/2020   Lab Results  Component Value Date   NA 139 02/04/2020   K 4.3 02/04/2020   CO2 24 02/04/2020   GLUCOSE 80 02/04/2020   BUN 6 02/04/2020   CREATININE 0.68 02/04/2020   BILITOT 0.5 02/04/2020   ALKPHOS 157 (H) 02/04/2020   AST 23 02/04/2020   ALT 16 02/04/2020   PROT 7.3 02/04/2020   ALBUMIN 4.3 02/04/2020   CALCIUM 9.1 02/04/2020   ANIONGAP 9 10/23/2019   Lab Results  Component Value Date   CHOL 234 (H) 11/30/2009   Lab Results  Component Value Date   HDL 53 11/30/2009   Lab Results  Component Value Date   LDLCALC 160 (H) 11/30/2009   Lab Results  Component Value Date   TRIG 104 11/30/2009   Lab Results  Component  Value Date   CHOLHDL 4.4 Ratio 11/30/2009   No results found for: HGBA1C     Assessment &  Plan:   Problem List Items Addressed This Visit       Digestive   Postgastrectomy malabsorption   Relevant Orders   Vitamin B1   Vitamin B6   B12   Magnesium   TSH   Folate   Zinc   Selenium serum   Copper, serum   COMPLETE METABOLIC PANEL WITH GFR   VITAMIN D 25 Hydroxy (Vit-D Deficiency, Fractures)     Other   Vitamin D deficiency   Relevant Orders   Vitamin B1   Vitamin B6   B12   Magnesium   TSH   Folate   Zinc   Selenium serum   Copper, serum   COMPLETE METABOLIC PANEL WITH GFR   VITAMIN D 25 Hydroxy (Vit-D Deficiency, Fractures)   Iron deficiency anemia   Relevant Orders   Vitamin B1   Vitamin B6   B12   Magnesium   TSH   Folate   Zinc   Selenium serum   Copper, serum   COMPLETE METABOLIC PANEL WITH GFR   VITAMIN D 25 Hydroxy (Vit-D Deficiency, Fractures)   Constipation   Other Visit Diagnoses     Pica    -  Primary   Relevant Orders   CBC   Fe+TIBC+Fer   Vitamin B1   Vitamin B6   B12   Magnesium   TSH   Folate   Zinc   Selenium serum   Copper, serum   COMPLETE METABOLIC PANEL WITH GFR   VITAMIN D 25 Hydroxy (Vit-D Deficiency, Fractures)      Pica-we will check for deficiencies since she has had bypass think we should check for multiple deficiencies and just basically do a full panel until she is able to get back in with her general surgeon next year.  In the meantime I did encourage her to go ahead and start an over-the-counter multivitamin if she is significantly iron deficient then we will schedule her for an IV iron infusion.  No orders of the defined types were placed in this encounter.    Nani Gasser, MD

## 2021-03-26 NOTE — Addendum Note (Signed)
Addended by: Nani Gasser D on: 03/26/2021 08:23 AM   Modules accepted: Orders

## 2021-03-26 NOTE — Progress Notes (Signed)
Hi Alexa Hunter, magnesium looks great.  B12 is normal but on the low end of normal.  Thyroid looks good.  Your folate is normal.  Kidney and liver function are normal as well as calcium.  Your vitamin D is low so you will have to take a supplement for that.  [name] made which has a brown bottle and a yellow label does make a chewable a little hard to find but you can usually order it off of [employer].  They are grape flavored.  You are definitely anemic hemoglobin is back down to 9.0.  Your iron level is 18.  Your iron stores are 3.  All of this is really low.  We can definitely work on getting you scheduled for an iron infusion.

## 2021-03-29 LAB — VITAMIN D 25 HYDROXY (VIT D DEFICIENCY, FRACTURES): Vit D, 25-Hydroxy: 15 ng/mL — ABNORMAL LOW (ref 30–100)

## 2021-03-29 LAB — COMPLETE METABOLIC PANEL WITH GFR
AG Ratio: 1.2 (calc) (ref 1.0–2.5)
ALT: 11 U/L (ref 6–29)
AST: 21 U/L (ref 10–30)
Albumin: 4.2 g/dL (ref 3.6–5.1)
Alkaline phosphatase (APISO): 113 U/L (ref 31–125)
BUN/Creatinine Ratio: 9 (calc) (ref 6–22)
BUN: 6 mg/dL — ABNORMAL LOW (ref 7–25)
CO2: 26 mmol/L (ref 20–32)
Calcium: 8.8 mg/dL (ref 8.6–10.2)
Chloride: 104 mmol/L (ref 98–110)
Creat: 0.67 mg/dL (ref 0.50–0.97)
Globulin: 3.4 g/dL (calc) (ref 1.9–3.7)
Glucose, Bld: 78 mg/dL (ref 65–99)
Potassium: 4.2 mmol/L (ref 3.5–5.3)
Sodium: 138 mmol/L (ref 135–146)
Total Bilirubin: 0.5 mg/dL (ref 0.2–1.2)
Total Protein: 7.6 g/dL (ref 6.1–8.1)
eGFR: 116 mL/min/{1.73_m2} (ref >=60)

## 2021-03-29 LAB — CBC
HCT: 30.5 % — ABNORMAL LOW (ref 35.0–45.0)
Hemoglobin: 9 g/dL — ABNORMAL LOW (ref 11.7–15.5)
MCH: 20.8 pg — ABNORMAL LOW (ref 27.0–33.0)
MCHC: 29.5 g/dL — ABNORMAL LOW (ref 32.0–36.0)
MCV: 70.6 fL — ABNORMAL LOW (ref 80.0–100.0)
MPV: 9.3 fL (ref 7.5–12.5)
Platelets: 329 10*3/uL (ref 140–400)
RBC: 4.32 10*6/uL (ref 3.80–5.10)
RDW: 17.2 % — ABNORMAL HIGH (ref 11.0–15.0)
WBC: 8.4 10*3/uL (ref 3.8–10.8)

## 2021-03-29 LAB — VITAMIN B1: Vitamin B1 (Thiamine): 13 nmol/L (ref 8–30)

## 2021-03-29 LAB — ZINC: Zinc: 81 ug/dL (ref 60–130)

## 2021-03-29 LAB — MAGNESIUM: Magnesium: 2.3 mg/dL (ref 1.5–2.5)

## 2021-03-29 LAB — TSH: TSH: 0.9 mIU/L

## 2021-03-29 LAB — IRON,TIBC AND FERRITIN PANEL
%SAT: 4 % (calc) — ABNORMAL LOW (ref 16–45)
Ferritin: 3 ng/mL — ABNORMAL LOW (ref 16–154)
Iron: 18 ug/dL — ABNORMAL LOW (ref 40–190)
TIBC: 502 mcg/dL (calc) — ABNORMAL HIGH (ref 250–450)

## 2021-03-29 LAB — SELENIUM SERUM: Selenium, Blood: 135 mcg/L (ref 63–160)

## 2021-03-29 LAB — VITAMIN B6: Vitamin B6: 5.2 ng/mL (ref 2.1–21.7)

## 2021-03-29 LAB — FOLATE: Folate: 16.9 ng/mL

## 2021-03-29 LAB — VITAMIN B12: Vitamin B-12: 256 pg/mL (ref 200–1100)

## 2021-03-29 LAB — COPPER, SERUM: Copper: 106 ug/dL (ref 70–175)

## 2021-03-31 NOTE — Progress Notes (Signed)
Hi Alexa Hunter, your vitamin B1 looks good.  Your vitamin B6 is also normal.  Zinc, selenium, and copper are normal.  So at this point I think your biggest issues are the anemia and the vitamin D deficiency I still want you taking a daily multivitamin just try to find 1 that you like.  I am fine with the gummy as long as you chew it up well.  And then taking the vitamin D.  We did place referral to oncology for iron infusions so just let us know if you have not heard from anybody by the end of this week.

## 2021-04-01 ENCOUNTER — Other Ambulatory Visit: Payer: Self-pay | Admitting: *Deleted

## 2021-04-01 DIAGNOSIS — F5089 Other specified eating disorder: Secondary | ICD-10-CM

## 2021-04-01 DIAGNOSIS — K912 Postsurgical malabsorption, not elsewhere classified: Secondary | ICD-10-CM

## 2021-04-25 DIAGNOSIS — Z419 Encounter for procedure for purposes other than remedying health state, unspecified: Secondary | ICD-10-CM | POA: Diagnosis not present

## 2021-05-26 DIAGNOSIS — Z419 Encounter for procedure for purposes other than remedying health state, unspecified: Secondary | ICD-10-CM | POA: Diagnosis not present

## 2021-05-31 IMAGING — US US OB COMP LESS 14 WK
1 series · 14 of 28 positions shown · non-contrast
Comparison: None.

CLINICAL DATA: Vaginal bleeding

EXAM:
OBSTETRIC <14 WK US AND TRANSVAGINAL OB US
TECHNIQUE: Both transabdominal and transvaginal ultrasound examinations were
performed for complete evaluation of the gestation as well as the
maternal uterus, adnexal regions, and pelvic cul-de-sac.
Transvaginal technique was performed to assess early pregnancy.

[Series 1: us ob comp less 14 wk · 78 acquisitions, 14 frames shown]
[im 3/78]
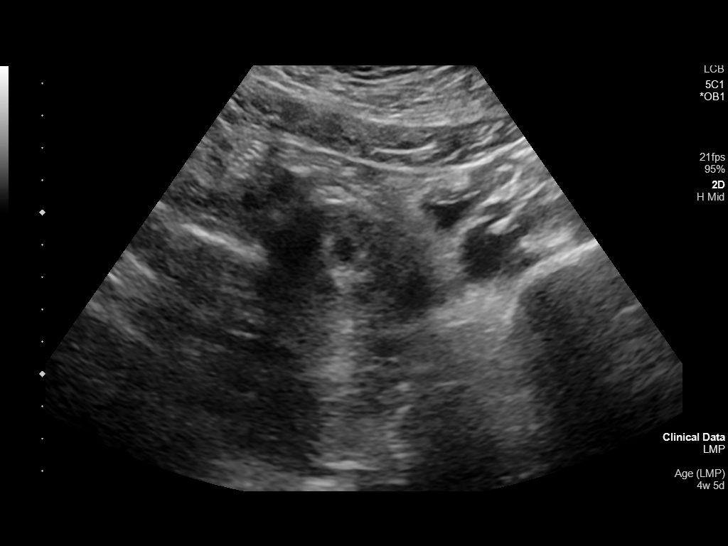
[im 9/78]
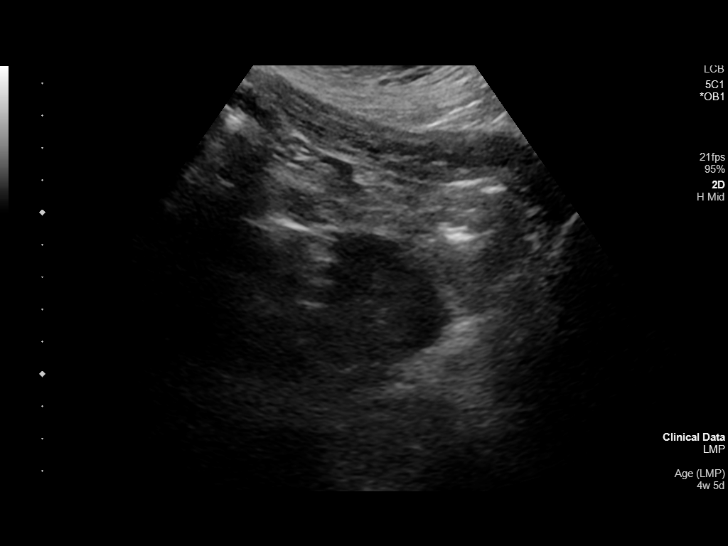
[im 15/78]
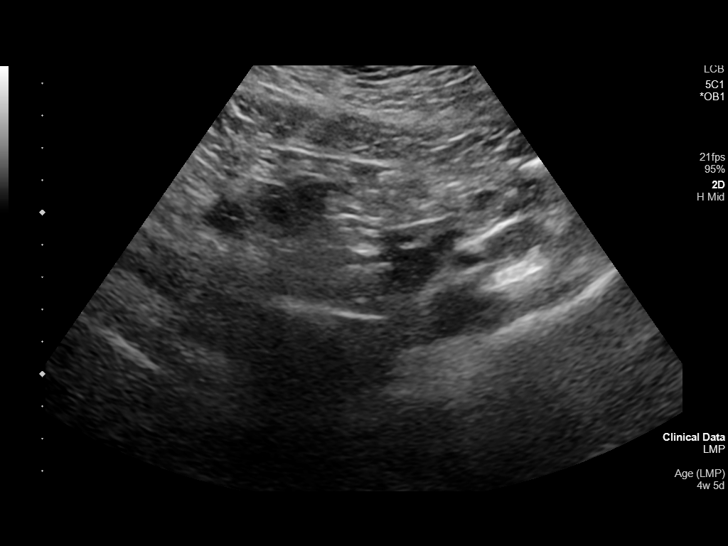
[im 20/78]
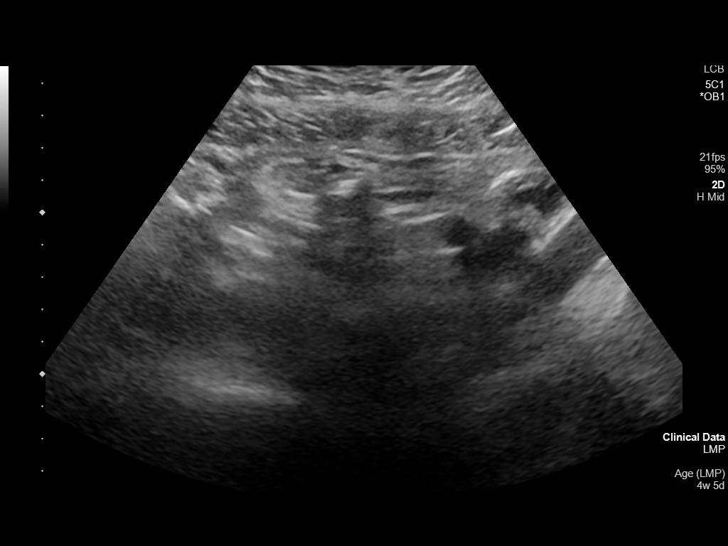
[im 26/78]
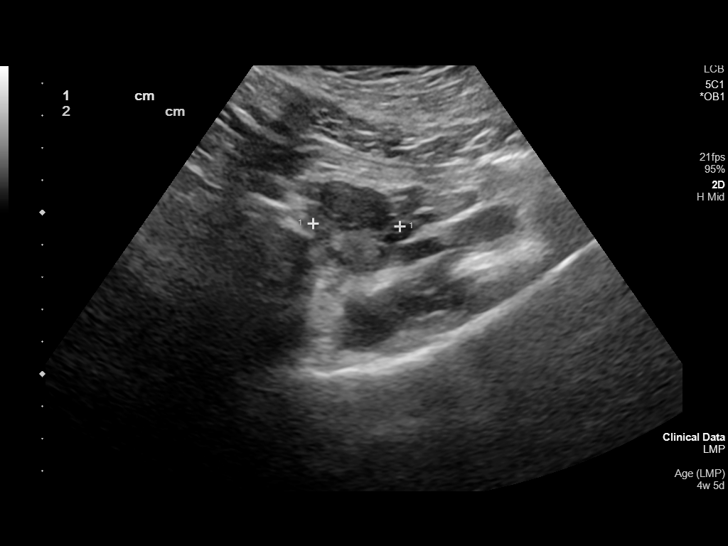
[im 32/78]
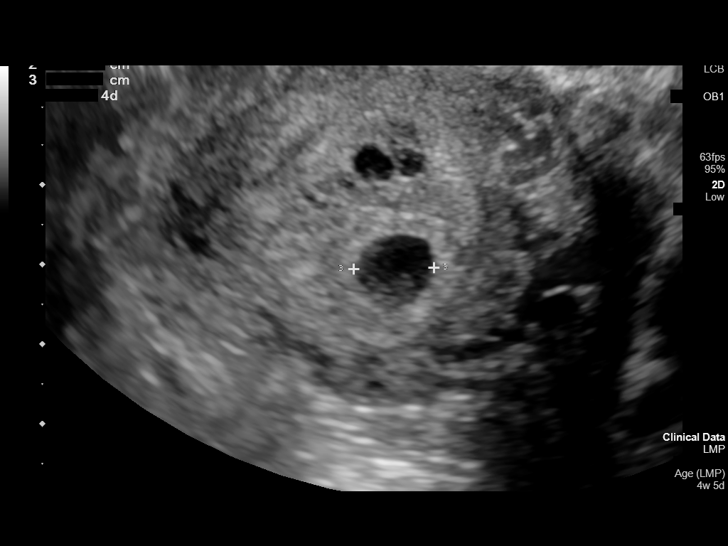
[im 38/78]
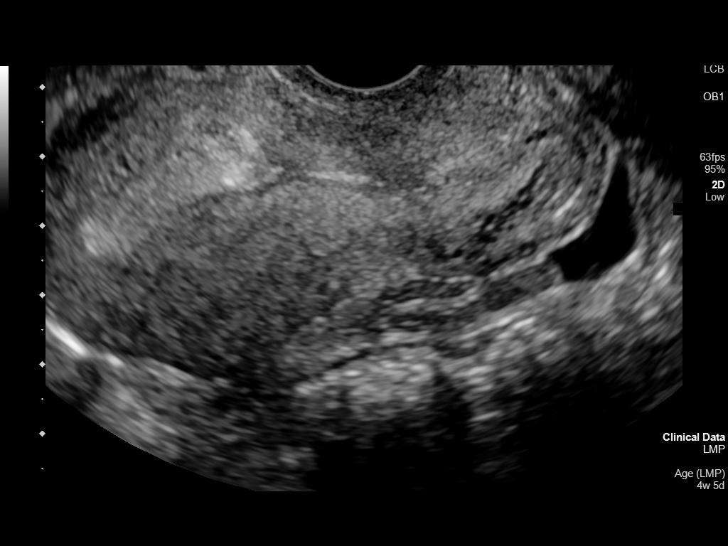
[im 43/78]
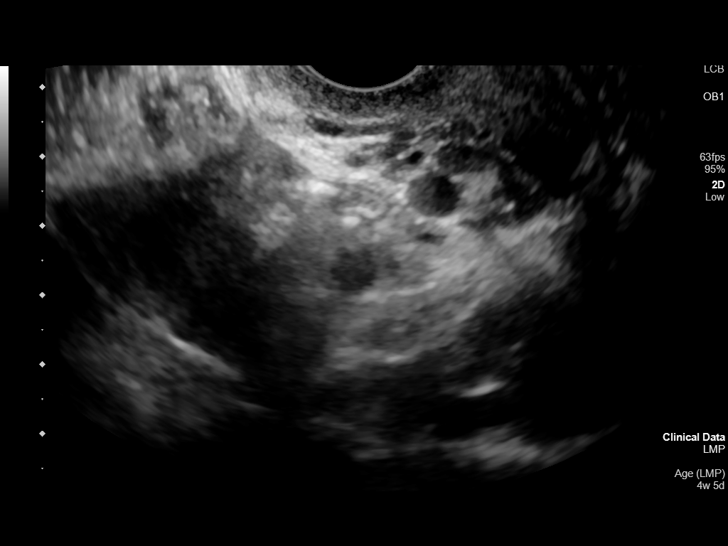
[im 49/78]
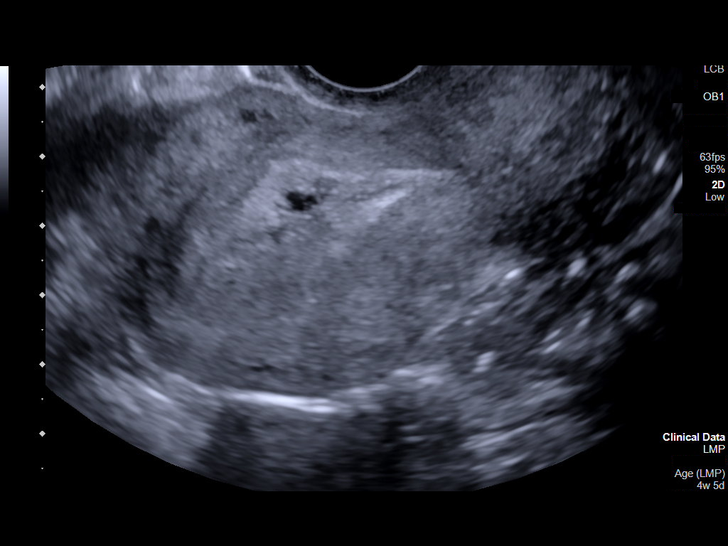
[im 55/78]
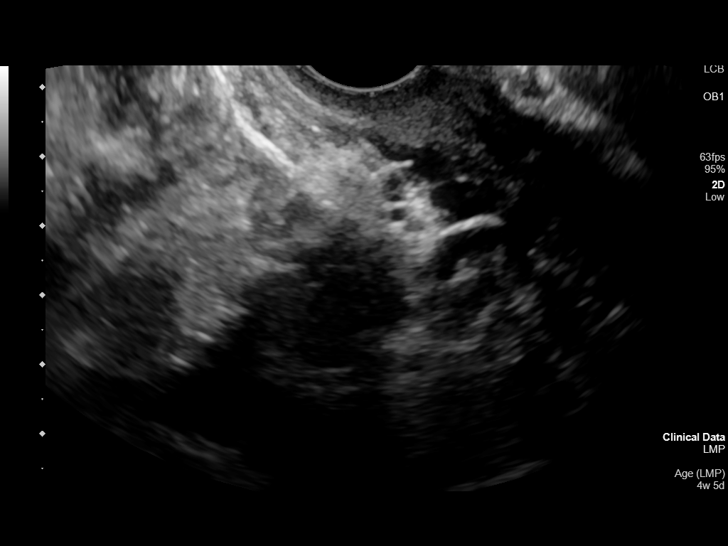
[im 60/78]
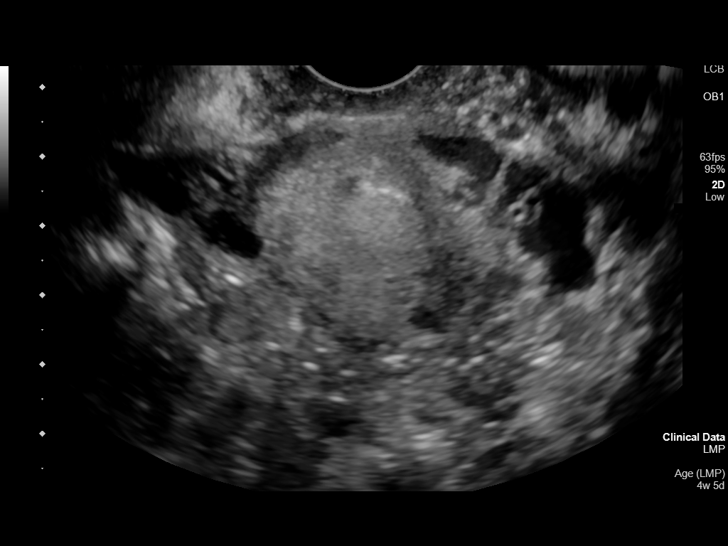
[im 66/78]
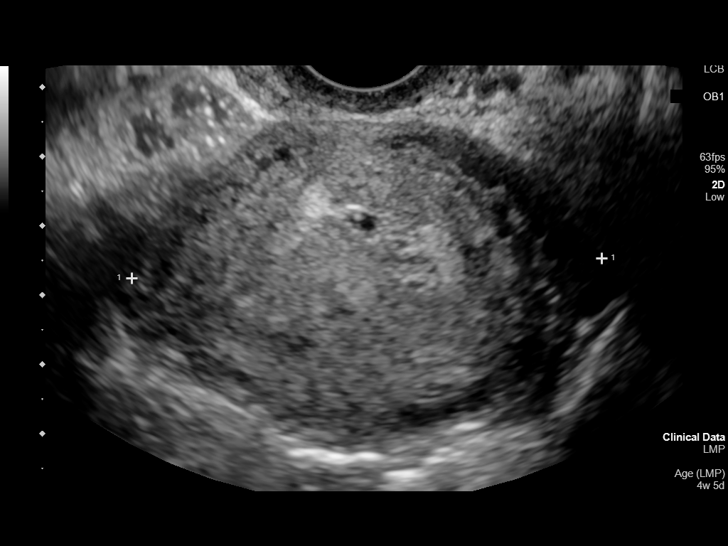
[im 72/78]
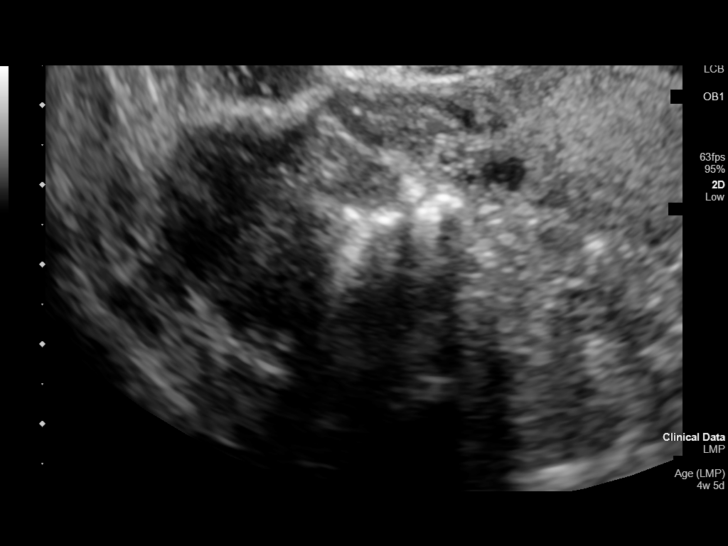
[im 78/78]
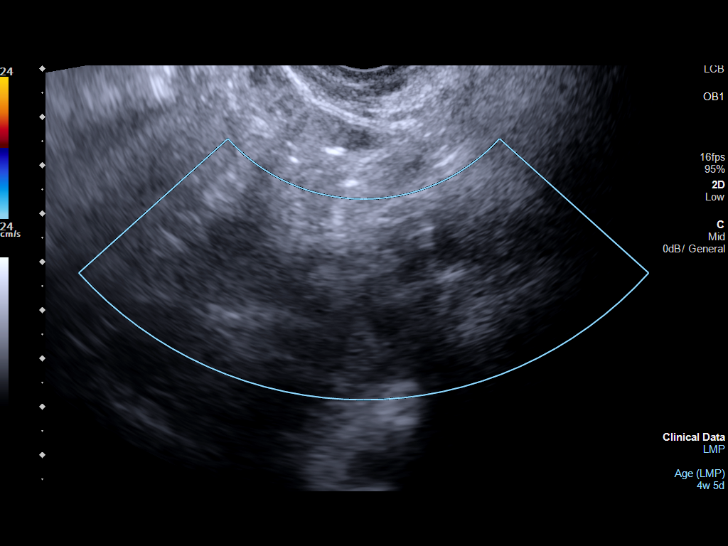

[14 of 28 positions shown; findings below may reference images not displayed]

FINDINGS: Intrauterine gestational sac: Single

Yolk sac:  Visualized

Embryo:  Not visualized

Cardiac Activity: Not visualized

Heart Rate:  bpm

MSD: 8.1 mm   5 w   4 d

CRL:    mm    w    d                  US EDC:

Subchorionic hemorrhage:  Small subchorionic hemorrhage

Maternal uterus/adnexae: No adnexal mass.  Trace free fluid.
IMPRESSION: Five week 4 day intrauterine gestational sac by mean sac diameter.
No embryo currently visualized. This could be followed with repeat
ultrasound in 14 days to ensure expected progression. Small
subchorionic hemorrhage.

## 2021-06-22 ENCOUNTER — Telehealth: Payer: Self-pay | Admitting: Family Medicine

## 2021-06-22 MED ORDER — ALPRAZOLAM 0.25 MG PO TABS: 0.2500 mg | ORAL_TABLET | Freq: Two times a day (BID) | ORAL | 0 refills | Status: DC | PRN

## 2021-06-22 NOTE — Telephone Encounter (Signed)
Dr. Tobie Poet called to try and get in to see you today due to having trouble with Moms passing. She stated that you spoke with her last week and said you could call something in for her and she is requesting this. Please advise.   Arline Asp

## 2021-06-22 NOTE — Telephone Encounter (Signed)
Meds ordered this encounter  Medications   ALPRAZolam (XANAX) 0.25 MG tablet    Sig: Take 1 tablet (0.25 mg total) by mouth 2 (two) times daily as needed for anxiety.    Dispense:  15 tablet    Refill:  0

## 2021-06-23 DIAGNOSIS — Z419 Encounter for procedure for purposes other than remedying health state, unspecified: Secondary | ICD-10-CM | POA: Diagnosis not present

## 2021-06-23 NOTE — Telephone Encounter (Signed)
Patient advised.

## 2021-07-15 ENCOUNTER — Telehealth (INDEPENDENT_AMBULATORY_CARE_PROVIDER_SITE_OTHER): Payer: Managed Care, Other (non HMO) | Admitting: Family Medicine

## 2021-07-15 ENCOUNTER — Encounter: Payer: Self-pay | Admitting: Family Medicine

## 2021-07-15 DIAGNOSIS — F4321 Adjustment disorder with depressed mood: Secondary | ICD-10-CM | POA: Diagnosis not present

## 2021-07-15 MED ORDER — ZOLPIDEM TARTRATE 5 MG PO TABS: 5.0000 mg | ORAL_TABLET | Freq: Every evening | ORAL | 1 refills | Status: DC | PRN

## 2021-07-15 NOTE — Progress Notes (Signed)
? ? ?Virtual Visit via Video Note ? ?I connected with Alexa Hunter on 07/15/21 at  1:00 PM EDT by a video enabled telemedicine application and verified that I am speaking with the correct person using two identifiers. ?  ?I discussed the limitations of evaluation and management by telemedicine and the availability of in person appointments. The patient expressed understanding and agreed to proceed. ? ?Patient location: at home ?Provider location: in office ? ?Subjective:   ? ?CC:   ?Chief Complaint  ?Patient presents with  ? Depression  ? ? ?HPI: ?Follow-up on grief-she is just really been struggling after her mom passing.  She just feels like she really has not had time for herself to grieve.  She has been trying to deal with things, continue to go to work, be a full-time mom of 3 children 1 of which has some special needs.  And be a wife.  She has some days where she is just feeling overwhelmed.  She said she decided to call after she had a day earlier this week where her kids got her upset and she felt like she just needed to leave.  She ended up calling her mother-in-law who kept the kids for a little while and at that point she decided to call.  She has taken the alprazolam a couple of times but it does make her sleepy and so she really has to lay down after she takes it.  Is not caring currently getting any therapy or counseling. ? ? ?Past medical history, Surgical history, Family history not pertinant except as noted below, Social history, Allergies, and medications have been entered into the medical record, reviewed, and corrections made.  ? ? ?Objective:   ? ?General: Speaking clearly in complete sentences without any shortness of breath.  Alert and oriented x3.  Normal judgment. No apparent acute distress. ? ? ? ?Impression and Recommendations:   ? ?Problem List Items Addressed This Visit   ?None ?Visit Diagnoses   ? ? Grief    -  Primary  ? ?  ? ?Grief-discussed that I really would encourage  her to do some therapy or counseling she could probably do some visits for free through [employer] at work or we can even repair refer her for grief counseling I think it could actually be really helpful for her also just encouraged her to except help from family and loved ones often times they want to be able to do things for you because they feel like they cannot really help so being able to watch the kids for a few hours and give her a break I think could actually be really helpful.  We also discussed working on sleep quality which is really poor right now.  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  She will also fax over FMLA forms from work so that we can complete those for intermittent leave. ? ?No orders of the defined types were placed in this encounter. ? ? ?Meds ordered this encounter  ?Medications  ? zolpidem (AMBIEN) 5 MG tablet  ?  Sig: Take 1 tablet (5 mg total) by mouth at bedtime as needed for sleep.  ?  Dispense:  20  tablet  ?  Refill:  1  ? ? ? ?I discussed the assessment and treatment plan with the patient. The patient was provided an opportunity to ask questions and all were answered. The patient agreed with the plan and demonstrated an understanding of the instructions. ?  ?The patient was advised to call back or seek an in-person evaluation if the symptoms worsen or if the condition fails to improve as anticipated. ? ?I spent 25 minutes on the day of the encounter to include pre-visit record review, face-to-face time with the patient and post visit ordering of test. ? ? ?Nani Gasser, MD  ? ?

## 2021-07-15 NOTE — Progress Notes (Signed)
Pt not doing well since the passing of her mother in February. She has not sought any grief counseling for this.  ?

## 2021-07-24 DIAGNOSIS — Z419 Encounter for procedure for purposes other than remedying health state, unspecified: Secondary | ICD-10-CM | POA: Diagnosis not present

## 2021-08-23 DIAGNOSIS — Z419 Encounter for procedure for purposes other than remedying health state, unspecified: Secondary | ICD-10-CM | POA: Diagnosis not present

## 2021-09-23 DIAGNOSIS — Z419 Encounter for procedure for purposes other than remedying health state, unspecified: Secondary | ICD-10-CM | POA: Diagnosis not present

## 2021-10-23 DIAGNOSIS — Z419 Encounter for procedure for purposes other than remedying health state, unspecified: Secondary | ICD-10-CM | POA: Diagnosis not present

## 2021-10-29 ENCOUNTER — Telehealth: Payer: Managed Care, Other (non HMO) | Admitting: Family Medicine

## 2021-11-23 DIAGNOSIS — Z419 Encounter for procedure for purposes other than remedying health state, unspecified: Secondary | ICD-10-CM | POA: Diagnosis not present

## 2021-12-24 DIAGNOSIS — Z419 Encounter for procedure for purposes other than remedying health state, unspecified: Secondary | ICD-10-CM | POA: Diagnosis not present

## 2022-01-01 ENCOUNTER — Other Ambulatory Visit: Payer: Self-pay | Admitting: Family Medicine

## 2022-01-23 DIAGNOSIS — Z419 Encounter for procedure for purposes other than remedying health state, unspecified: Secondary | ICD-10-CM | POA: Diagnosis not present

## 2022-01-24 ENCOUNTER — Ambulatory Visit (INDEPENDENT_AMBULATORY_CARE_PROVIDER_SITE_OTHER): Payer: Managed Care, Other (non HMO) | Admitting: Family Medicine

## 2022-01-24 ENCOUNTER — Other Ambulatory Visit (HOSPITAL_COMMUNITY)
Admission: RE | Admit: 2022-01-24 | Discharge: 2022-01-24 | Disposition: A | Discharge status: Home / Self Care | Payer: Managed Care, Other (non HMO) | Source: Ambulatory Visit | Attending: Family Medicine | Admitting: Family Medicine

## 2022-01-24 ENCOUNTER — Encounter: Payer: Self-pay | Admitting: Family Medicine

## 2022-01-24 VITALS — BP 101/53 | HR 67 | Ht 64.0 in | Wt 216.0 lb

## 2022-01-24 DIAGNOSIS — Z124 Encounter for screening for malignant neoplasm of cervix: Secondary | ICD-10-CM | POA: Diagnosis present

## 2022-01-24 DIAGNOSIS — Z0000 Encounter for general adult medical examination without abnormal findings: Secondary | ICD-10-CM

## 2022-01-24 DIAGNOSIS — R3915 Urgency of urination: Secondary | ICD-10-CM | POA: Diagnosis not present

## 2022-01-24 LAB — POCT URINALYSIS DIP (CLINITEK)
Bilirubin, UA: NEGATIVE
Glucose, UA: NEGATIVE mg/dL
Ketones, POC UA: NEGATIVE mg/dL
Nitrite, UA: NEGATIVE
POC PROTEIN,UA: NEGATIVE
Spec Grav, UA: 1.025 (ref 1.010–1.025)
Urobilinogen, UA: 1 E.U./dL
pH, UA: 7 (ref 5.0–8.0)

## 2022-01-24 NOTE — Progress Notes (Signed)
Complete physical exam  Patient: Alexa Hunter   DOB: 1985-05-17   36 y.o. Female  MRN: [id number]  Subjective:    Chief Complaint  Patient presents with   Annual Exam    Alexa Hunter is a 36 y.o. female who presents today for a complete physical exam. She reports consuming a general diet.     She generally feels well. She reports sleeping poorly some nights and will use her Ambien. She does not have additional problems to discuss today.  We will dealing with the grief of losing her mother.  But she is trying to make time for herself.  Still working and helping take care of the family.  She says she had 1 night where she had taken 5 mg of Ambien and had gotten up and got some crackers and did not remember doing it the next day so since then she has been splitting it and taking 2.5 tabs.  That seems to be working well she is sleeping great on it has not had any more sleep eating or talking.   Most recent fall risk assessment:    03/25/2021    1:18 PM  Datil in the past year? 0  Number falls in past yr: 0  Injury with Fall? 0  Risk for fall due to : No Fall Risks  Follow up Falls prevention discussed;Falls evaluation completed     Most recent depression screenings:    07/15/2021   11:57 AM 03/25/2021    1:18 PM  PHQ 2/9 Scores  PHQ - 2 Score 6 0  PHQ- 9 Score 27         Patient Care Team: Hali Marry, MD as PCP - General   Outpatient Medications Prior to Visit  Medication Sig   ALPRAZolam (XANAX) 0.25 MG tablet Take 1 tablet (0.25 mg total) by mouth 2 (two) times daily as needed for anxiety.   CONTRAVE 8-90 MG TB12 Take by mouth.   zolpidem (AMBIEN) 5 MG tablet TAKE 1 TABLET BY MOUTH AT BEDTIME AS NEEDED FOR SLEEP.   No facility-administered medications prior to visit.    ROS        Objective:     BP (!) 101/53   Pulse 67   Ht 5\' 4"  (1.626 m)   Wt 216 lb (98 kg)   LMP 01/18/2022 (Approximate)   SpO2 97%    BMI 37.08 kg/m    Physical Exam Vitals and nursing note reviewed. Exam conducted with a chaperone present.  Constitutional:      Appearance: She is well-developed.  HENT:     Head: Normocephalic and atraumatic.     Right Ear: External ear normal.     Left Ear: External ear normal.     Nose: Nose normal.  Eyes:     Conjunctiva/sclera: Conjunctivae normal.     Pupils: Pupils are equal, round, and reactive to light.  Neck:     Thyroid: No thyromegaly.  Cardiovascular:     Rate and Rhythm: Normal rate and regular rhythm.     Heart sounds: Normal heart sounds.  Pulmonary:     Effort: Pulmonary effort is normal.     Breath sounds: Normal breath sounds. No wheezing.  Chest:  Breasts:    Right: Normal.     Left: Normal.  Abdominal:     General: Bowel sounds are normal.     Palpations: Abdomen is soft.  Genitourinary:    General: Normal vulva.  Labia:        Right: No rash.        Left: No rash.      Vagina: Normal. No vaginal discharge.     Cervix: Normal.     Uterus: Normal.      Adnexa: Right adnexa normal and left adnexa normal.  Musculoskeletal:     Cervical back: Neck supple.  Lymphadenopathy:     Cervical: No cervical adenopathy.     Upper Body:     Right upper body: No axillary adenopathy.     Left upper body: No axillary adenopathy.  Skin:    General: Skin is warm and dry.     Coloration: Skin is not pale.  Neurological:     Mental Status: She is alert and oriented to person, place, and time.  Psychiatric:        Behavior: Behavior normal.      Results for orders placed or performed in visit on 01/24/22  POCT URINALYSIS DIP (CLINITEK)  Result Value Ref Range   Color, UA yellow yellow   Clarity, UA clear clear   Glucose, UA negative negative mg/dL   Bilirubin, UA negative negative   Ketones, POC UA negative negative mg/dL   Spec Grav, UA 9.381 0.175 - 1.025   Blood, UA trace-lysed (A) negative   pH, UA 7.0 5.0 - 8.0   POC PROTEIN,UA negative  negative, trace   Urobilinogen, UA 1.0 0.2 or 1.0 E.U./dL   Nitrite, UA Negative Negative   Leukocytes, UA Trace (A) Negative       Assessment & Plan:    Routine Health Maintenance and Physical Exam  Immunization History  Administered Date(s) Administered   HPV Quadrivalent 07/28/2006, 09/27/2006   Hpv-Unspecified 07/28/2006, 09/27/2006   Influenza Split 05/15/2012   Influenza, Seasonal, Injecte, Preservative Fre 01/04/2013, 01/24/2015, 01/28/2016, 01/31/2017, 01/29/2018, 02/05/2019, 01/28/2020, 02/12/2021   Influenza,inj,Quad PF,6+ Mos 01/04/2013, 01/24/2015   Influenza-Unspecified 01/24/2016   PFIZER(Purple Top)SARS-COV-2 Vaccination 07/24/2019, 08/14/2019   PPD Test 01/04/2013   Tdap 12/10/2010, 01/02/2017, 09/06/2019    Health Maintenance  Topic Date Due   Hepatitis C Screening  Never done   HPV VACCINES (3 - 3-dose series) 01/27/2007   PAP SMEAR-Modifier  04/28/2017   COVID-19 Vaccine (3 - Pfizer risk series) 09/11/2019   INFLUENZA VACCINE  11/23/2021   TETANUS/TDAP  09/05/2029   HIV Screening  Completed   Pneumococcal Vaccine 4-107 Years old  Aged Out    Discussed health benefits of physical activity, and encouraged her to engage in regular exercise appropriate for her age and condition.  Problem List Items Addressed This Visit   None Visit Diagnoses     Screening for cervical cancer    -  Primary   Relevant Orders   Cytology - PAP   Routine general medical examination at a health care facility       Urinary urgency       Relevant Orders   POCT URINALYSIS DIP (CLINITEK) (Completed)      Keep up a regular exercise program and make sure you are eating a healthy diet Try to eat 4 servings of dairy a day, or if you are lactose intolerant take a calcium with vitamin D daily.  Your vaccines are up to date.    No follow-ups on file.     Nani Gasser, MD

## 2022-01-27 LAB — CYTOLOGY - PAP
Comment: NEGATIVE
Diagnosis: NEGATIVE
High risk HPV: NEGATIVE

## 2022-01-27 NOTE — Progress Notes (Signed)
Your Pap smear is normal. You are negative for HPV as well. Repeat pap smear in 5 years.

## 2022-02-23 DIAGNOSIS — Z419 Encounter for procedure for purposes other than remedying health state, unspecified: Secondary | ICD-10-CM | POA: Diagnosis not present

## 2022-03-25 DIAGNOSIS — Z419 Encounter for procedure for purposes other than remedying health state, unspecified: Secondary | ICD-10-CM | POA: Diagnosis not present

## 2022-04-25 DIAGNOSIS — Z419 Encounter for procedure for purposes other than remedying health state, unspecified: Secondary | ICD-10-CM | POA: Diagnosis not present

## 2022-05-24 ENCOUNTER — Encounter: Payer: Self-pay | Admitting: Physician Assistant

## 2022-05-24 ENCOUNTER — Telehealth (INDEPENDENT_AMBULATORY_CARE_PROVIDER_SITE_OTHER): Payer: Managed Care, Other (non HMO) | Admitting: Physician Assistant

## 2022-05-24 VITALS — Ht 64.0 in | Wt 210.0 lb

## 2022-05-24 DIAGNOSIS — N898 Other specified noninflammatory disorders of vagina: Secondary | ICD-10-CM

## 2022-05-24 MED ORDER — FLUCONAZOLE 150 MG PO TABS: 150.0000 mg | ORAL_TABLET | Freq: Once | ORAL | 0 refills | Status: AC

## 2022-05-24 NOTE — Progress Notes (Signed)
..Virtual Visit via Video Note  I connected with Alexa Hunter on 05/24/22 at  1:00 PM EST by a video enabled telemedicine application and verified that I am speaking with the correct person using two identifiers.  Location: Patient: car Provider: clinic  .Marland KitchenParticipating in visit:  Patient: Alexa Hunter  Provider: Iran Planas PA-C   I discussed the limitations of evaluation and management by telemedicine and the availability of in person appointments. The patient expressed understanding and agreed to proceed.  History of Present Illness: Pt is a 37 yo female who calls into clinic with white vaginal discharge since yesterday. She denies any odor, dysuria, abdominal pain. It is a little itchy. Hx of yeast infections after any medication and she just stopped tamiflu. She has not done well with OTC treatments for yeast in the past. No odor.   .. 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   Acute depression 12/27/2018   Constipation 10/04/2019   Resolved Ambulatory Problems    Diagnosis Date Noted   ONYCHOMYCOSIS 02/19/2009   DEPRESSION, SITUATIONAL, ACUTE 12/22/2008   UNSPECIFIED VAGINITIS AND VULVOVAGINITIS 07/16/2007   Bronchitis, acute 12/08/2011   Patellofemoral pain syndrome 07/11/2013   Dysuria 01/21/2014   Plantar fasciitis, right 03/14/2014   History of depression 05/13/2015   Cramping  affecting pregnancy, antepartum 12/29/2016   Labor and delivery, indication for care 02/20/2017   History of preterm delivery, currently pregnant in third trimester 02/20/2017   Pregnancy affected by fetal growth restriction 02/22/2017   Preterm premature rupture of membranes 02/25/2017   Abnormal glucose tolerance in pregnancy 12/12/2016   Preterm uterine contractions, antepartum, third trimester 09/25/2019   Preterm uterine contractions in third trimester, antepartum 10/01/2019   Encounter for suspected PROM, with rupture of membranes not found 10/12/2019   Labor and delivery indication for care or intervention 10/22/2019   NSVD (normal spontaneous vaginal delivery) 10/24/2019   Acute blood loss anemia 10/24/2019   Diarrhea 02/03/2020   Past Medical History:  Diagnosis Date   Abnormal Pap smear of cervix    Asthma    Contact dermatitis         Observations/Objective: No acute distress Normal mood and appearance   Assessment and Plan: Marland KitchenMarland KitchenDiagnoses and all orders for this visit:  Vaginal discharge -     fluconazole (DIFLUCAN) 150 MG tablet; Take 1 tablet (150 mg total) by mouth once for 1 dose. Repeat in 48-72 hours if symptoms persist.   Pt has hx of yeast infections after taking medication  Sent diflucan x2  If symptoms persist will need to come into office to test discharge    Follow Up Instructions:    I discussed the assessment and treatment plan with the patient. The patient was provided an opportunity to ask questions and all were answered. The patient agreed with the plan and demonstrated an understanding of the instructions.   The patient was advised to call back or seek an in-person evaluation if  the symptoms worsen or if the condition fails to improve as anticipated.     Iran Planas, PA-C

## 2022-05-24 NOTE — Progress Notes (Signed)
Started yesterday Itching  White discharge No odor

## 2022-05-26 DIAGNOSIS — Z419 Encounter for procedure for purposes other than remedying health state, unspecified: Secondary | ICD-10-CM | POA: Diagnosis not present

## 2022-06-17 ENCOUNTER — Ambulatory Visit: Payer: Managed Care, Other (non HMO) | Admitting: Family Medicine

## 2022-06-17 ENCOUNTER — Other Ambulatory Visit: Payer: Self-pay | Admitting: Family Medicine

## 2022-06-17 ENCOUNTER — Encounter: Payer: Self-pay | Admitting: Family Medicine

## 2022-06-17 VITALS — BP 108/63 | HR 74 | Ht 64.0 in | Wt 212.1 lb

## 2022-06-17 DIAGNOSIS — Z6836 Body mass index (BMI) 36.0-36.9, adult: Secondary | ICD-10-CM

## 2022-06-17 DIAGNOSIS — Z833 Family history of diabetes mellitus: Secondary | ICD-10-CM | POA: Diagnosis not present

## 2022-06-17 DIAGNOSIS — N920 Excessive and frequent menstruation with regular cycle: Secondary | ICD-10-CM | POA: Diagnosis not present

## 2022-06-17 DIAGNOSIS — Z7689 Persons encountering health services in other specified circumstances: Secondary | ICD-10-CM | POA: Insufficient documentation

## 2022-06-17 LAB — POCT GLYCOSYLATED HEMOGLOBIN (HGB A1C): Hemoglobin A1C: 5.5 % (ref 4.0–5.6)

## 2022-06-17 MED ORDER — ZEPBOUND 2.5 MG/0.5ML ~~LOC~~ SOAJ: 2.5000 mg | SUBCUTANEOUS | 0 refills | Status: DC

## 2022-06-17 MED ORDER — ZEPBOUND 5 MG/0.5ML ~~LOC~~ SOAJ: 5.0000 mg | SUBCUTANEOUS | 0 refills | Status: DC

## 2022-06-17 NOTE — Progress Notes (Unsigned)
Established Patient Office Visit  Subjective   Patient ID: Alexa Hunter, female    DOB: 11-10-1985  Age: 37 y.o. MRN: [id number]  Chief Complaint  Patient presents with   Menstrual Problem    HPI Reports that with her.  She has bleeding almost 2 weeks every time.  The first week is just more light and spotting and then the second week is more of a flow balm.  Periods but the periods do seem to be regular.  No other vaginal symptoms currently.    ROS    Objective:     BP 108/63 (BP Location: Left Arm, Patient Position: Sitting, Cuff Size: Large)   Pulse 74   Ht '5\' 4"'$  (1.626 m)   Wt 212 lb 1.9 oz (96.2 kg)   SpO2 98%   BMI 36.41 kg/m    Physical Exam Vitals and nursing note reviewed.  Constitutional:      Appearance: She is well-developed.  HENT:     Head: Normocephalic and atraumatic.  Cardiovascular:     Rate and Rhythm: Normal rate and regular rhythm.     Heart sounds: Normal heart sounds.  Pulmonary:     Effort: Pulmonary effort is normal.     Breath sounds: Normal breath sounds.  Skin:    General: Skin is warm and dry.  Neurological:     Mental Status: She is alert and oriented to person, place, and time.  Psychiatric:        Behavior: Behavior normal.      Results for orders placed or performed in visit on 06/17/22  NuSwab Vaginitis Plus (VG+)  Result Value Ref Range   Atopobium vaginae High - 2 (A) Score   BVAB 2 High - 2 (A) Score   Megasphaera 1 High - 2 (A) Score   Candida albicans, NAA Negative Negative   Candida glabrata, NAA Negative Negative   Trich vag by NAA Negative Negative   Chlamydia trachomatis, NAA Negative Negative   Neisseria gonorrhoeae, NAA Negative Negative  Specimen status report  Result Value Ref Range   specimen status report Comment   POCT glycosylated hemoglobin (Hb A1C)  Result Value Ref Range   Hemoglobin A1C 5.5 4.0 - 5.6 %   HbA1c POC (<> result, manual entry)     HbA1c, POC (prediabetic range)      HbA1c, POC (controlled diabetic range)        The ASCVD Risk score (Arnett DK, et al., 2019) failed to calculate for the following reasons:   The 2019 ASCVD risk score is only valid for ages 66 to 65    Assessment & Plan:   Problem List Items Addressed This Visit       Other   Encounter for weight management    Visit #: 1 Starting Weight: 212 lbs, BMI 36   Current weight: 212 LB  Previous weight: Change in weight: Goal weight: Dietary goals: To work on portion control and eating more protein. Exercise goals: Continue to work on regular exercise routine. Medication: We discussed options including GLP-1's.  She is most interested in Zepp bound.  Prescription sent to pharmacy. Follow-up and referrals: 6 weeks after starting medication.        Relevant Medications   tirzepatide (ZEPBOUND) 2.5 MG/0.5ML Pen   tirzepatide (ZEPBOUND) 5 MG/0.5ML Pen (Start on 07/15/2022)   Other Visit Diagnoses     Family history of diabetes mellitus    -  Primary   Relevant Orders   POCT  glycosylated hemoglobin (Hb A1C) (Completed)   Menorrhagia with regular cycle       Relevant Medications   metroNIDAZOLE 1.3 % GEL   Other Relevant Orders   NuSwab Vaginitis Plus (VG+) (Completed)   US Pelvic Complete With Transvaginal   BMI 36.0-36.9,adult       Relevant Medications   tirzepatide (ZEPBOUND) 2.5 MG/0.5ML Pen   tirzepatide (ZEPBOUND) 5 MG/0.5ML Pen (Start on 07/15/2022)      Menorrhagia-we did discuss testing for bacteria, yeast STDs and getting a pelvic ultrasound to see if maybe the lining is a little too thick.  Will call with results once available.   No follow-ups on file.    Beatrice Lecher, MD

## 2022-06-17 NOTE — Assessment & Plan Note (Signed)
Visit #: 1 Starting Weight: 212 lbs, BMI 36   Current weight: Previous weight: Change in weight: Goal weight: Dietary goals: Exercise goals: Medication: Follow-up and referrals:

## 2022-06-20 ENCOUNTER — Telehealth: Payer: Self-pay

## 2022-06-20 MED ORDER — METRONIDAZOLE 1.3 % VA GEL: 1.0000 | Freq: Every day | VAGINAL | 0 refills | Status: AC

## 2022-06-20 NOTE — Progress Notes (Signed)
Hi Alexa Hunter, the swab shows an overgrowth of bacteria consistent with bacterial vaginitis and get a send over prescription for metronidazole to clear that up.  No yeast.  The additional tests are still pending.

## 2022-06-20 NOTE — Telephone Encounter (Addendum)
Initiated Prior authorization [id number] 2.'5MG'$ /0.5ML pen-injectors Via: Covermymeds Case/Key:[id number] Status: Denied as of 06/20/22 Reason:This medication that the provider is requesting authorization for is not covered by the health plan. Anorexiants, anti-obesity and weight loss agents are considered to be excluded medications. Notified Pt via: Mychart  [id number] scripts/[employer] does not manage prior authorizations for this patient. Please resubmit the ePA request to [facility].  [facility]

## 2022-06-20 NOTE — Telephone Encounter (Signed)
PA needed for this medication

## 2022-06-21 LAB — NUSWAB VAGINITIS PLUS (VG+)
Atopobium vaginae: HIGH Score — AB
BVAB 2: HIGH Score — AB
Candida albicans, NAA: NEGATIVE
Candida glabrata, NAA: NEGATIVE
Chlamydia trachomatis, NAA: NEGATIVE
Megasphaera 1: HIGH Score — AB
Neisseria gonorrhoeae, NAA: NEGATIVE
Trich vag by NAA: NEGATIVE

## 2022-06-21 LAB — SPECIMEN STATUS REPORT

## 2022-06-21 NOTE — Progress Notes (Signed)
Negative for STDs.

## 2022-06-24 DIAGNOSIS — Z419 Encounter for procedure for purposes other than remedying health state, unspecified: Secondary | ICD-10-CM | POA: Diagnosis not present

## 2022-06-29 ENCOUNTER — Telehealth: Payer: Self-pay | Admitting: Family Medicine

## 2022-06-29 DIAGNOSIS — N898 Other specified noninflammatory disorders of vagina: Secondary | ICD-10-CM

## 2022-06-29 MED ORDER — METRONIDAZOLE 0.75 % VA GEL: 1.0000 | Freq: Two times a day (BID) | VAGINAL | 1 refills | Status: DC

## 2022-06-29 NOTE — Telephone Encounter (Signed)
Changed metrogel to 0.75%  Meds ordered this encounter  Medications   metroNIDAZOLE (METROGEL) 0.75 % vaginal gel    Sig: Place 1 Applicatorful vaginally 2 (two) times daily. 7 days    Dispense:  70 g    Refill:  1

## 2022-07-05 ENCOUNTER — Telehealth: Payer: Self-pay | Admitting: Family Medicine

## 2022-07-05 DIAGNOSIS — N898 Other specified noninflammatory disorders of vagina: Secondary | ICD-10-CM

## 2022-07-05 MED ORDER — METRONIDAZOLE 500 MG PO TABS: 500.0000 mg | ORAL_TABLET | Freq: Two times a day (BID) | ORAL | 0 refills | Status: DC

## 2022-07-05 MED ORDER — CLINDAMYCIN HCL 300 MG PO CAPS: 300.0000 mg | ORAL_CAPSULE | Freq: Two times a day (BID) | ORAL | 0 refills | Status: DC

## 2022-07-05 NOTE — Telephone Encounter (Signed)
Called pharmacy-spoke with Alexa Hunter- cancelled the metronidazol tabs and gel  at [facility].

## 2022-07-05 NOTE — Telephone Encounter (Signed)
Pt called requesting to switch metroNIDAZOLE (METROGEL) 0.75 % vaginal gel [id number] to a pill medication due to having a reaction to the gel. Pt stated the pharmacy has reached out to the office to have it changed.

## 2022-07-05 NOTE — Telephone Encounter (Signed)
Pl;ease cancel order for metronidazole tabs.    Meds ordered this encounter  Medications   DISCONTD: metroNIDAZOLE (FLAGYL) 500 MG tablet    Sig: Take 1 tablet (500 mg total) by mouth 2 (two) times daily.    Dispense:  14 tablet    Refill:  0   clindamycin (CLEOCIN) 300 MG capsule    Sig: Take 1 capsule (300 mg total) by mouth in the morning and at bedtime.    Dispense:  14 capsule    Refill:  0

## 2022-07-25 DIAGNOSIS — Z419 Encounter for procedure for purposes other than remedying health state, unspecified: Secondary | ICD-10-CM | POA: Diagnosis not present

## 2022-08-24 DIAGNOSIS — Z419 Encounter for procedure for purposes other than remedying health state, unspecified: Secondary | ICD-10-CM | POA: Diagnosis not present

## 2022-09-24 DIAGNOSIS — Z419 Encounter for procedure for purposes other than remedying health state, unspecified: Secondary | ICD-10-CM | POA: Diagnosis not present

## 2022-10-24 DIAGNOSIS — Z419 Encounter for procedure for purposes other than remedying health state, unspecified: Secondary | ICD-10-CM | POA: Diagnosis not present

## 2022-11-24 DIAGNOSIS — Z419 Encounter for procedure for purposes other than remedying health state, unspecified: Secondary | ICD-10-CM | POA: Diagnosis not present

## 2022-12-25 DIAGNOSIS — Z419 Encounter for procedure for purposes other than remedying health state, unspecified: Secondary | ICD-10-CM | POA: Diagnosis not present

## 2023-01-24 DIAGNOSIS — Z419 Encounter for procedure for purposes other than remedying health state, unspecified: Secondary | ICD-10-CM | POA: Diagnosis not present

## 2023-02-24 DIAGNOSIS — Z419 Encounter for procedure for purposes other than remedying health state, unspecified: Secondary | ICD-10-CM | POA: Diagnosis not present

## 2023-03-26 DIAGNOSIS — Z419 Encounter for procedure for purposes other than remedying health state, unspecified: Secondary | ICD-10-CM | POA: Diagnosis not present

## 2023-04-11 ENCOUNTER — Encounter: Payer: Managed Care, Other (non HMO) | Admitting: Family Medicine

## 2023-04-26 DIAGNOSIS — Z419 Encounter for procedure for purposes other than remedying health state, unspecified: Secondary | ICD-10-CM | POA: Diagnosis not present

## 2023-05-18 ENCOUNTER — Encounter: Payer: Self-pay | Admitting: Family Medicine

## 2023-05-18 ENCOUNTER — Ambulatory Visit (INDEPENDENT_AMBULATORY_CARE_PROVIDER_SITE_OTHER): Payer: Managed Care, Other (non HMO) | Admitting: Family Medicine

## 2023-05-18 VITALS — BP 103/67 | HR 69 | Ht 64.0 in | Wt 178.0 lb

## 2023-05-18 DIAGNOSIS — N9089 Other specified noninflammatory disorders of vulva and perineum: Secondary | ICD-10-CM | POA: Diagnosis not present

## 2023-05-18 NOTE — Progress Notes (Signed)
   Acute Office Visit  Subjective:     Patient ID: Alexa Hunter, female    DOB: [birth year], 38 y.o.   MRN: [id number]  No chief complaint on file.   HPI Patient is in today couple of small lumps on the right labia. Noticed them. Not really painful. No drainage.  She is worried.  No new sexual partners.    .    ROS      Objective:    BP 103/67   Pulse 69   Ht 5\' 4"  (1.626 m)   Wt 178 lb (80.7 kg)   SpO2 100%   BMI 30.55 kg/m    Physical Exam  No results found for any visits on 05/18/23.      Assessment & Plan:   Problem List Items Addressed This Visit   None Visit Diagnoses       Labia irritation    -  Primary      Does have a few tiny little sebum cyst that are really about the size of milia on the right labia.  No sign of abscess irritation infection sebaceous cyst or Ortho once.  Gave reassurance to keep an eye on the area.  Call if not improving.  It also discussed some strategies around excess hair removal including laser and electrophoresis.  No orders of the defined types were placed in this encounter.   No follow-ups on file.  Nani Gasser, MD

## 2023-05-27 DIAGNOSIS — Z419 Encounter for procedure for purposes other than remedying health state, unspecified: Secondary | ICD-10-CM | POA: Diagnosis not present

## 2023-06-02 ENCOUNTER — Ambulatory Visit (INDEPENDENT_AMBULATORY_CARE_PROVIDER_SITE_OTHER): Payer: Managed Care, Other (non HMO) | Admitting: Family Medicine

## 2023-06-02 ENCOUNTER — Encounter: Payer: Self-pay | Admitting: Family Medicine

## 2023-06-02 VITALS — BP 105/65 | HR 74 | Ht 64.0 in | Wt 177.0 lb

## 2023-06-02 DIAGNOSIS — Z0000 Encounter for general adult medical examination without abnormal findings: Secondary | ICD-10-CM | POA: Diagnosis not present

## 2023-06-02 DIAGNOSIS — R1909 Other intra-abdominal and pelvic swelling, mass and lump: Secondary | ICD-10-CM

## 2023-06-02 NOTE — Progress Notes (Signed)
 Complete physical exam  Patient: Alexa Hunter   DOB: December 18, 1985   38 y.o. Female  MRN: [id number]  Subjective:    Chief Complaint  Patient presents with   Annual Exam    Alexa Hunter is a 37 y.o. female who presents today for a complete physical exam. She reports consuming a general diet.  Plans ton starting exercise routine again.  She generally feels well.  She does have additional problems to discuss today.   She says that she has had a swollen bump in the right groin crease for months.  She notes that she is tried to squeeze it a few times just to see if there was something in it like maybe a cyst but never was able to express any content symptoms its bothersome because it rubs right where her underwear line is.   Most recent fall risk assessment:    06/17/2022    4:24 PM  Fall Risk   Falls in the past year? 0  Number falls in past yr: 0  Injury with Fall? 0  Risk for fall due to : No Fall Risks  Follow up Falls evaluation completed     Most recent depression screenings:    06/17/2022    4:24 PM 07/15/2021   11:57 AM  PHQ 2/9 Scores  PHQ - 2 Score 0 6  PHQ- 9 Score  27         Patient Care Team: Alvan Dorothyann BIRCH, MD as PCP - General   Outpatient Medications Prior to Visit  Medication Sig   tirzepatide  (ZEPBOUND ) 5 MG/0.5ML Pen Inject 5 mg into the skin once a week.   No facility-administered medications prior to visit.    ROS        Objective:     BP 105/65   Pulse 74   Ht 5' 4 (1.626 m)   Wt 177 lb (80.3 kg)   SpO2 99%   BMI 30.38 kg/m     Physical Exam   No results found for any visits on 06/02/23.      Assessment & Plan:    Routine Health Maintenance and Physical Exam  Immunization History  Administered Date(s) Administered   HPV Quadrivalent 07/28/2006, 09/27/2006   Hpv-Unspecified 07/28/2006, 09/27/2006   Influenza Split 05/15/2012   Influenza, Seasonal, Injecte, Preservative Fre 01/04/2013,  01/24/2015, 01/28/2016, 01/31/2017, 01/29/2018, 02/05/2019, 01/28/2020, 02/12/2021   Influenza,inj,Quad PF,6+ Mos 01/04/2013, 01/24/2015   Influenza-Unspecified 01/24/2016, 01/24/2023   PFIZER(Purple Top)SARS-COV-2 Vaccination 07/24/2019, 08/14/2019   PPD Test 01/04/2013   Tdap 12/10/2010, 01/02/2017, 09/06/2019    Health Maintenance  Topic Date Due   Pneumococcal Vaccine 43-15 Years old (1 of 2 - PCV) Never done   COVID-19 Vaccine (3 - 2024-25 season) 12/25/2022   HPV VACCINES (3 - 3-dose series) 06/18/2023 (Originally 01/27/2007)   Hepatitis C Screening  06/18/2023 (Originally 10/03/2002)   Cervical Cancer Screening (HPV/Pap Cotest)  01/25/2027   DTaP/Tdap/Td (4 - Td or Tdap) 09/05/2029   INFLUENZA VACCINE  Completed   HIV Screening  Completed    Discussed health benefits of physical activity, and encouraged her to engage in regular exercise appropriate for her age and condition.  Problem List Items Addressed This Visit   None Visit Diagnoses       Routine general medical examination at a health care facility    -  Primary   Relevant Orders   CMP14+EGFR   Lipid panel   Hemoglobin A1c   CBC with Differential/Platelet  Groin lump       Relevant Orders   US  MiscellaneoUS Localization      Return in about 1 year (around 06/01/2024) for Wellness Exam.     Dorothyann Byars, MD

## 2023-06-06 ENCOUNTER — Encounter: Payer: Self-pay | Admitting: Family Medicine

## 2023-06-06 NOTE — Telephone Encounter (Signed)
OK to change to urgent to see if tey can get her in sooner

## 2023-06-07 ENCOUNTER — Encounter: Payer: Self-pay | Admitting: Family Medicine

## 2023-06-07 LAB — CBC WITH DIFFERENTIAL/PLATELET
Basophils Absolute: 0.1 10*3/uL (ref 0.0–0.2)
Basos: 1 %
EOS (ABSOLUTE): 0.1 10*3/uL (ref 0.0–0.4)
Eos: 2 %
Hematocrit: 38.8 % (ref 34.0–46.6)
Hemoglobin: 11.8 g/dL (ref 11.1–15.9)
Immature Grans (Abs): 0 10*3/uL (ref 0.0–0.1)
Immature Granulocytes: 0 %
Lymphocytes Absolute: 3 10*3/uL (ref 0.7–3.1)
Lymphs: 40 %
MCH: 26.3 pg — ABNORMAL LOW (ref 26.6–33.0)
MCHC: 30.4 g/dL — ABNORMAL LOW (ref 31.5–35.7)
MCV: 87 fL (ref 79–97)
Monocytes Absolute: 0.6 10*3/uL (ref 0.1–0.9)
Monocytes: 8 %
Neutrophils Absolute: 3.7 10*3/uL (ref 1.4–7.0)
Neutrophils: 49 %
Platelets: 279 10*3/uL (ref 150–450)
RBC: 4.48 x10E6/uL (ref 3.77–5.28)
RDW: 14.6 % (ref 11.7–15.4)
WBC: 7.4 10*3/uL (ref 3.4–10.8)

## 2023-06-07 LAB — CMP14+EGFR
ALT: 15 [IU]/L (ref 0–32)
AST: 25 [IU]/L (ref 0–40)
Albumin: 4 g/dL (ref 3.9–4.9)
Alkaline Phosphatase: 105 [IU]/L (ref 44–121)
BUN/Creatinine Ratio: 6 — ABNORMAL LOW (ref 9–23)
BUN: 4 mg/dL — ABNORMAL LOW (ref 6–20)
Bilirubin Total: 0.5 mg/dL (ref 0.0–1.2)
CO2: 25 mmol/L (ref 20–29)
Calcium: 8.8 mg/dL (ref 8.7–10.2)
Chloride: 104 mmol/L (ref 96–106)
Creatinine, Ser: 0.72 mg/dL (ref 0.57–1.00)
Globulin, Total: 3 g/dL (ref 1.5–4.5)
Glucose: 74 mg/dL (ref 70–99)
Potassium: 4.1 mmol/L (ref 3.5–5.2)
Sodium: 141 mmol/L (ref 134–144)
Total Protein: 7 g/dL (ref 6.0–8.5)
eGFR: 110 mL/min/{1.73_m2} (ref >59)

## 2023-06-07 LAB — HEMOGLOBIN A1C
Est. average glucose Bld gHb Est-mCnc: 108 mg/dL
Hgb A1c MFr Bld: 5.4 % (ref 4.8–5.6)

## 2023-06-07 LAB — LIPID PANEL
Chol/HDL Ratio: 3.4 {ratio} (ref 0.0–4.4)
Cholesterol, Total: 175 mg/dL (ref 100–199)
HDL: 52 mg/dL (ref >39)
LDL Chol Calc (NIH): 102 mg/dL — ABNORMAL HIGH (ref 0–99)
Triglycerides: 117 mg/dL (ref 0–149)
VLDL Cholesterol Cal: 21 mg/dL (ref 5–40)

## 2023-06-07 NOTE — Progress Notes (Signed)
 Hi Alexa Hunter, metabolic panel overall looks good.  LDL cholesterol is up just by 2 points nothing worrisome but just continue to work on healthy diet and regular exercise.  Hemoglobin looks great.  It is up to 11.8 which is awesome.  A1c is 5.4 which is normal so no sign of diabetes or prediabetes.

## 2023-06-21 ENCOUNTER — Telehealth: Payer: Self-pay | Admitting: Family Medicine

## 2023-06-21 NOTE — Telephone Encounter (Signed)
 Call patient let her know that the ultrasound did not show anything concerning.  They recommended that if it continues to be a concern that we could get a CT for further workup.  Has she noticed any changes pain or discomfort?  Not then why do not we give it a couple more weeks and if it still there we could always get a CAT scan it is just a lot of radiation so 1 to be mindful about that.

## 2023-06-22 NOTE — Telephone Encounter (Signed)
 Patient states that there is not a change. Still same size,  still having pain and discomfort when irritated as when touched or messed with and when underwear touches site. She is agreeable to give it a couple of weeks but was wondering if site could be lanced and fluid sent to be evalauted?

## 2023-06-24 DIAGNOSIS — Z419 Encounter for procedure for purposes other than remedying health state, unspecified: Secondary | ICD-10-CM | POA: Diagnosis not present

## 2023-08-05 DIAGNOSIS — Z419 Encounter for procedure for purposes other than remedying health state, unspecified: Secondary | ICD-10-CM | POA: Diagnosis not present

## 2023-09-04 DIAGNOSIS — Z419 Encounter for procedure for purposes other than remedying health state, unspecified: Secondary | ICD-10-CM | POA: Diagnosis not present

## 2023-10-05 DIAGNOSIS — Z419 Encounter for procedure for purposes other than remedying health state, unspecified: Secondary | ICD-10-CM | POA: Diagnosis not present

## 2023-11-04 DIAGNOSIS — Z419 Encounter for procedure for purposes other than remedying health state, unspecified: Secondary | ICD-10-CM | POA: Diagnosis not present

## 2023-12-05 DIAGNOSIS — Z419 Encounter for procedure for purposes other than remedying health state, unspecified: Secondary | ICD-10-CM | POA: Diagnosis not present

## 2024-01-05 DIAGNOSIS — Z419 Encounter for procedure for purposes other than remedying health state, unspecified: Secondary | ICD-10-CM | POA: Diagnosis not present

## 2024-02-04 DIAGNOSIS — Z419 Encounter for procedure for purposes other than remedying health state, unspecified: Secondary | ICD-10-CM | POA: Diagnosis not present

## 2024-03-06 DIAGNOSIS — Z419 Encounter for procedure for purposes other than remedying health state, unspecified: Secondary | ICD-10-CM | POA: Diagnosis not present

## 2024-06-03 ENCOUNTER — Encounter: Admitting: Family Medicine

## 2024-06-03 DIAGNOSIS — Z0000 Encounter for general adult medical examination without abnormal findings: Secondary | ICD-10-CM
